# Patient Record
Sex: Female | Born: 1950
Health system: Southern US, Community
[De-identification: ages and names within clinical notes are randomized; demographics above are authoritative.]

## PROBLEM LIST (undated history)

## (undated) DIAGNOSIS — Z9889 Other specified postprocedural states: Secondary | ICD-10-CM

## (undated) DIAGNOSIS — E119 Type 2 diabetes mellitus without complications: Secondary | ICD-10-CM

## (undated) DIAGNOSIS — R748 Abnormal levels of other serum enzymes: Secondary | ICD-10-CM

## (undated) DIAGNOSIS — T8859XA Other complications of anesthesia, initial encounter: Secondary | ICD-10-CM

## (undated) DIAGNOSIS — J45909 Unspecified asthma, uncomplicated: Secondary | ICD-10-CM

## (undated) DIAGNOSIS — H02402 Unspecified ptosis of left eyelid: Secondary | ICD-10-CM

## (undated) DIAGNOSIS — T7840XA Allergy, unspecified, initial encounter: Secondary | ICD-10-CM

## (undated) DIAGNOSIS — I10 Essential (primary) hypertension: Secondary | ICD-10-CM

## (undated) DIAGNOSIS — G510 Bell's palsy: Secondary | ICD-10-CM

## (undated) DIAGNOSIS — E785 Hyperlipidemia, unspecified: Secondary | ICD-10-CM

## (undated) DIAGNOSIS — R112 Nausea with vomiting, unspecified: Secondary | ICD-10-CM

## (undated) DIAGNOSIS — S065XAA Traumatic subdural hemorrhage with loss of consciousness status unknown, initial encounter: Secondary | ICD-10-CM

## (undated) DIAGNOSIS — K760 Fatty (change of) liver, not elsewhere classified: Secondary | ICD-10-CM

## (undated) DIAGNOSIS — K219 Gastro-esophageal reflux disease without esophagitis: Secondary | ICD-10-CM

## (undated) HISTORY — DX: Type 2 diabetes mellitus without complications: E11.9

## (undated) HISTORY — DX: Abnormal levels of other serum enzymes: R74.8

## (undated) HISTORY — DX: Allergy, unspecified, initial encounter: T78.40XA

## (undated) HISTORY — DX: Essential (primary) hypertension: I10

## (undated) HISTORY — PX: BREAST BIOPSY: SHX20

## (undated) HISTORY — PX: CRANIOTOMY: SHX93

## (undated) HISTORY — PX: CRANIECTOMY: SHX331

## (undated) HISTORY — DX: Unspecified asthma, uncomplicated: J45.909

## (undated) HISTORY — DX: Hyperlipidemia, unspecified: E78.5

## (undated) HISTORY — PX: TUBAL LIGATION: SHX77

## (undated) HISTORY — DX: Fatty (change of) liver, not elsewhere classified: K76.0

## (undated) HISTORY — PX: ABDOMINAL HYSTERECTOMY: SHX81

---
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
## 1973-10-08 HISTORY — PX: NASAL FRACTURE SURGERY: SHX718

## 2008-10-08 HISTORY — PX: NASAL SINUS SURGERY: SHX719

## 2019-10-09 HISTORY — PX: CRANIECTOMY: SHX331

## 2019-10-09 HISTORY — PX: CRANIOTOMY: SHX93

## 2020-05-10 DIAGNOSIS — L03213 Periorbital cellulitis: Secondary | ICD-10-CM | POA: Diagnosis not present

## 2020-05-17 DIAGNOSIS — L03213 Periorbital cellulitis: Secondary | ICD-10-CM | POA: Diagnosis not present

## 2020-06-10 ENCOUNTER — Other Ambulatory Visit: Payer: Self-pay

## 2020-06-14 ENCOUNTER — Encounter: Payer: Self-pay | Admitting: Nurse Practitioner

## 2020-06-14 ENCOUNTER — Ambulatory Visit (INDEPENDENT_AMBULATORY_CARE_PROVIDER_SITE_OTHER): Payer: Medicare Other | Admitting: Nurse Practitioner

## 2020-06-14 ENCOUNTER — Other Ambulatory Visit: Payer: Self-pay

## 2020-06-14 VITALS — BP 128/76 | HR 83 | Temp 98.2°F | Ht 64.5 in | Wt 167.0 lb

## 2020-06-14 DIAGNOSIS — I10 Essential (primary) hypertension: Secondary | ICD-10-CM | POA: Diagnosis not present

## 2020-06-14 DIAGNOSIS — K219 Gastro-esophageal reflux disease without esophagitis: Secondary | ICD-10-CM

## 2020-06-14 DIAGNOSIS — K760 Fatty (change of) liver, not elsewhere classified: Secondary | ICD-10-CM | POA: Diagnosis not present

## 2020-06-14 DIAGNOSIS — E785 Hyperlipidemia, unspecified: Secondary | ICD-10-CM

## 2020-06-14 DIAGNOSIS — E119 Type 2 diabetes mellitus without complications: Secondary | ICD-10-CM

## 2020-06-14 DIAGNOSIS — Z1231 Encounter for screening mammogram for malignant neoplasm of breast: Secondary | ICD-10-CM

## 2020-06-14 DIAGNOSIS — Z0000 Encounter for general adult medical examination without abnormal findings: Secondary | ICD-10-CM | POA: Insufficient documentation

## 2020-06-14 DIAGNOSIS — R748 Abnormal levels of other serum enzymes: Secondary | ICD-10-CM

## 2020-06-14 DIAGNOSIS — L989 Disorder of the skin and subcutaneous tissue, unspecified: Secondary | ICD-10-CM

## 2020-06-14 LAB — COMPREHENSIVE METABOLIC PANEL
ALT: 50 U/L — ABNORMAL HIGH (ref 0–35)
AST: 49 U/L — ABNORMAL HIGH (ref 0–37)
Albumin: 4.3 g/dL (ref 3.5–5.2)
Alkaline Phosphatase: 53 U/L (ref 39–117)
BUN: 13 mg/dL (ref 6–23)
CO2: 32 mEq/L (ref 19–32)
Calcium: 9.8 mg/dL (ref 8.4–10.5)
Chloride: 93 mEq/L — ABNORMAL LOW (ref 96–112)
Creatinine, Ser: 0.82 mg/dL (ref 0.40–1.20)
GFR: 69.24 mL/min (ref >60.00)
Glucose, Bld: 240 mg/dL — ABNORMAL HIGH (ref 70–99)
Potassium: 3.9 mEq/L (ref 3.5–5.1)
Sodium: 133 mEq/L — ABNORMAL LOW (ref 135–145)
Total Bilirubin: 0.3 mg/dL (ref 0.2–1.2)
Total Protein: 7.8 g/dL (ref 6.0–8.3)

## 2020-06-14 LAB — LIPID PANEL
Cholesterol: 185 mg/dL (ref 0–200)
HDL: 39.1 mg/dL (ref >39.00)
NonHDL: 145.97
Total CHOL/HDL Ratio: 5
Triglycerides: 286 mg/dL — ABNORMAL HIGH (ref 0.0–149.0)
VLDL: 57.2 mg/dL — ABNORMAL HIGH (ref 0.0–40.0)

## 2020-06-14 LAB — CBC WITH DIFFERENTIAL/PLATELET
Basophils Absolute: 0.1 10*3/uL (ref 0.0–0.1)
Basophils Relative: 0.8 % (ref 0.0–3.0)
Eosinophils Absolute: 0.2 10*3/uL (ref 0.0–0.7)
Eosinophils Relative: 2.9 % (ref 0.0–5.0)
HCT: 37.6 % (ref 36.0–46.0)
Hemoglobin: 12.8 g/dL (ref 12.0–15.0)
Lymphocytes Relative: 24.7 % (ref 12.0–46.0)
Lymphs Abs: 1.7 10*3/uL (ref 0.7–4.0)
MCHC: 33.9 g/dL (ref 30.0–36.0)
MCV: 83 fl (ref 78.0–100.0)
Monocytes Absolute: 0.3 10*3/uL (ref 0.1–1.0)
Monocytes Relative: 4.7 % (ref 3.0–12.0)
Neutro Abs: 4.6 10*3/uL (ref 1.4–7.7)
Neutrophils Relative %: 66.9 % (ref 43.0–77.0)
Platelets: 277 10*3/uL (ref 150.0–400.0)
RBC: 4.53 Mil/uL (ref 3.87–5.11)
RDW: 15.1 % (ref 11.5–15.5)
WBC: 6.8 10*3/uL (ref 4.0–10.5)

## 2020-06-14 LAB — HEMOGLOBIN A1C: Hgb A1c MFr Bld: 8.8 % — ABNORMAL HIGH (ref 4.6–6.5)

## 2020-06-14 LAB — VITAMIN D 25 HYDROXY (VIT D DEFICIENCY, FRACTURES): VITD: 21.95 ng/mL — ABNORMAL LOW (ref 30.00–100.00)

## 2020-06-14 LAB — MICROALBUMIN / CREATININE URINE RATIO
Creatinine,U: 45.8 mg/dL
Microalb Creat Ratio: 1.5 mg/g (ref 0.0–30.0)
Microalb, Ur: <0.7 mg/dL (ref 0.0–1.9)

## 2020-06-14 LAB — LDL CHOLESTEROL, DIRECT: Direct LDL: 109 mg/dL

## 2020-06-14 LAB — B12 AND FOLATE PANEL
Folate: 15.5 ng/mL (ref >5.9)
Vitamin B-12: 260 pg/mL (ref 211–911)

## 2020-06-14 LAB — TSH: TSH: 1.88 u[IU]/mL (ref 0.35–4.50)

## 2020-06-14 NOTE — Progress Notes (Signed)
New Patient Office Visit  Subjective:  Patient ID: Tiffany Gregory, female    DOB: 1951-07-09  Age: 69 y.o. MRN: [id number]  CC:  Chief Complaint  Patient presents with  . New Patient (Initial Visit)    establish care    HPI Tiffany Gregory is a 69 yo who presents to establish care with a [place] provider. She re-located from [place] in June. She fell on ice in January 2021 and struck her head. She  was hospitalized in Jan and had 2 craniotomies for subdural hematomas. She is working and has her own business as Administrator, arts for mental health providers. She occasionally  forgets something, but has recovered from her head injury.   Type 2 DM: Dx DM  in 2009 and on Metformin. On glimepiride and Januvia over the  last year. A1C down to 8 and up to 9.   FBS: 185 today. Not checking regularly. She is taking Meformin 500 mg BID.  Ran out of januvia 2.5 weeks ago- on it for a year- did not help. She was taken off of it from  11/19/19 x 21 days when she was in the hospital for subdural hemaotma.She added Januvia  back in April- May until she ran out of it again 2.5  weeks ago. Not sure she wants to re start it. Vision- saw Ophthalmologist for stye and has  f/up in Nov-needs dilated eye exam. No foot problems.   BMI 28.22.  Not following any diet and has been getting take out food because [place] could not do that in her [place].  Wt up from 150 lbs to  now 167 lbs.  Wt Readings from Last 3 Encounters:  06/14/20 167 lb (75.8 kg)     Patient presents today for complete physical.  Immunizations: flu due, last tetanus- 2017-2021- UTD Diet: poor- eating out too much- poor diet - wants to do better Exercise: walks the dogs Colonoscopy: last 2013 due 2023 Dexa: yes- 2018- reports normal  Pap Smear: last Pap 2018 normal - hysterectomy 1991. No vaginal bleeding Mammogram: Last 2017- reports normal  Dentist: Needs done   Past Medical History:  Diagnosis Date  . Allergy   . Asthma   .  Diabetes mellitus without complication [facility])     Past Surgical History:  Procedure Laterality Date  . ABDOMINAL HYSTERECTOMY    . BREAST BIOPSY    . CRANIECTOMY    . NASAL FRACTURE SURGERY  1975  . NASAL SINUS SURGERY  2010    Family History  Problem Relation Age of Onset  . Diabetes Mother   . Heart attack Mother   . Diabetes Father   . Heart disease Father   . Hypertension Father   . Diabetes Sister   . COPD Sister   . Heart disease Sister   . Diabetes Brother   . Heart attack Maternal Grandmother   . Cancer Maternal Grandfather   . Heart attack Paternal Grandmother   . Cancer Paternal Grandfather   . Heart attack Brother   . Heart disease Brother     Social History   Socioeconomic History  . Marital status: Married    Spouse name: Not on file  . Number of children: Not on file  . Years of education: Not on file  . Highest education level: Bachelor's degree (e.g., BA, AB, BS)  Occupational History  . Not on file  Tobacco Use  . Smoking status: Never Smoker  . Smokeless tobacco: Never Used  Vaping  Use  . Vaping Use: Never used  Substance and Sexual Activity  . Alcohol use: Not Currently  . Drug use: Never  . Sexual activity: Not Currently    Partners: Male  Other Topics Concern  . Not on file  Social History Narrative  . Not on file   Social Determinants of Health   Financial Resource Strain:   . Difficulty of Paying Living Expenses: Not on file  Food Insecurity:   . Worried About Charity fundraiser in the Last Year: Not on file  . Ran Out of Food in the Last Year: Not on file  Transportation Needs:   . Lack of Transportation (Medical): Not on file  . Lack of Transportation (Non-Medical): Not on file  Physical Activity:   . Days of Exercise per Week: Not on file  . Minutes of Exercise per Session: Not on file  Stress:   . Feeling of Stress : Not on file  Social Connections:   . Frequency of Communication with Friends and Family: Not on file    . Frequency of Social Gatherings with Friends and Family: Not on file  . Attends Religious Services: Not on file  . Active Member of Clubs or Organizations: Not on file  . Attends Archivist Meetings: Not on file  . Marital Status: Not on file  Intimate Partner Violence:   . Fear of Current or Ex-Partner: Not on file  . Emotionally Abused: Not on file  . Physically Abused: Not on file  . Sexually Abused: Not on file    Review of Systems  Constitutional: Negative for chills, fatigue and fever.  HENT: Negative.   Eyes: Negative.   Respiratory: Negative.   Cardiovascular: Negative.   Gastrointestinal: Positive for constipation and diarrhea.       IBS- diarrhea- constipation now diarrhea phase 1-2 times per day- not bad. Not tried probiotics or Citrucel.  GERD: curved esoaphuges could not be stretched by EGD. Cannot swallow large pills. No solid or liquid dysphagia.    Endocrine: Negative for cold intolerance, heat intolerance and polyuria.  Genitourinary: Negative.   Musculoskeletal: Negative.   Skin: Negative.        Lesion top of scalp > 1 year. Scalp skin lesion- wants a referral to Derm to get it removed.   Allergic/Immunologic: Positive for environmental allergies.       Hay fever - Zyrtec- as needed in [place]. Pataday helps.   Neurological: Negative for dizziness, seizures, syncope, light-headedness, numbness and headaches.       Little vertigo if turn too quickly at night when she turns over.   Hematological: Negative.   Psychiatric/Behavioral:       Positive anxiety- doing OK. No self harm thoughts.     Objective:   Today's Vitals: BP 128/76 (BP Location: Left Arm, Patient Position: Sitting, Cuff Size: Normal)   Pulse 83   Temp 98.2 F (36.8 C) (Oral)   Ht 5' 4.5" (1.638 m)   Wt 167 lb (75.8 kg)   SpO2 99%   BMI 28.22 kg/m   Physical Exam Vitals reviewed.  Constitutional:      Appearance: Normal appearance.  HENT:     Head: Normocephalic.  Eyes:      Extraocular Movements: Extraocular movements intact.     Conjunctiva/sclera: Conjunctivae normal.     Pupils: Pupils are equal, round, and reactive to light.  Cardiovascular:     Rate and Rhythm: Normal rate and regular rhythm.     Pulses:  Normal pulses.     Heart sounds: Normal heart sounds.  Pulmonary:     Effort: Pulmonary effort is normal.     Breath sounds: Normal breath sounds.  Abdominal:     Palpations: Abdomen is soft.     Tenderness: There is no abdominal tenderness.  Musculoskeletal:        General: Normal range of motion.     Cervical back: Normal range of motion and neck supple.  Skin:    General: Skin is warm and dry.  Neurological:     General: No focal deficit present.     Mental Status: She is alert and oriented to person, place, and time.  Psychiatric:        Mood and Affect: Mood normal.        Behavior: Behavior normal.        Thought Content: Thought content normal.        Judgment: Judgment normal.     Assessment & Plan:   Problem List Items Addressed This Visit      Cardiovascular and Mediastinum   Essential hypertension   Relevant Medications   aspirin EC 81 MG tablet   hydrochlorothiazide (HYDRODIURIL) 25 MG tablet   ramipril (ALTACE) 10 MG capsule   atorvastatin (LIPITOR) 20 MG tablet   Other Relevant Orders   Comprehensive metabolic panel (Completed)     Digestive   Fatty liver   Relevant Orders   US Abdomen Complete     Endocrine   Type 2 diabetes mellitus without complication, without long-term current use of insulin (HCC)   Relevant Medications   aspirin EC 81 MG tablet   metFORMIN (GLUCOPHAGE) 1000 MG tablet   ramipril (ALTACE) 10 MG capsule   atorvastatin (LIPITOR) 20 MG tablet   glimepiride (AMARYL) 2 MG tablet   sitaGLIPtin (JANUVIA) 100 MG tablet   Other Relevant Orders   CBC with Differential/Platelet (Completed)   TSH (Completed)   Hemoglobin A1c (Completed)   Lipid panel (Completed)   VITAMIN D 25 Hydroxy (Vit-D  Deficiency, Fractures) (Completed)   B12 and Folate Panel (Completed)   Microalbumin / creatinine urine ratio (Completed)     Musculoskeletal and Integument   Skin lesion   Relevant Orders   Ambulatory referral to Dermatology     Other   Encounter for medical examination to establish care - Primary    Other Visit Diagnoses    Encounter for screening mammogram for malignant neoplasm of breast       Relevant Orders   MM 3D SCREEN BREAST BILATERAL   Elevated liver enzymes       Relevant Orders   US Abdomen Complete      Outpatient Encounter Medications as of 06/14/2020  Medication Sig  . aspirin EC 81 MG tablet Take 81 mg by mouth daily. Swallow whole.  Marland Kitchen atorvastatin (LIPITOR) 20 MG tablet Take 20 mg by mouth daily.  . Blood Glucose Monitoring Suppl (ONE TOUCH ULTRA 2) w/Device KIT by Does not apply route.  Marland Kitchen glimepiride (AMARYL) 2 MG tablet Take 2 mg by mouth daily with breakfast.  . glucose blood test strip 1 each by Other route as needed for other. Use as instructed. One touch ultra blue in vitro strips  . hydrochlorothiazide (HYDRODIURIL) 25 MG tablet Take 25 mg by mouth daily.  . metFORMIN (GLUCOPHAGE) 1000 MG tablet Take 1,000 mg by mouth 2 (two) times daily with a meal.  . omeprazole (PRILOSEC) 20 MG capsule Take 20 mg by mouth daily.  Marland Kitchen  OneTouch Delica Lancets 82U MISC by Does not apply route.  . ramipril (ALTACE) 10 MG capsule Take 10 mg by mouth 2 (two) times daily.  . sitaGLIPtin (JANUVIA) 100 MG tablet Take 100 mg by mouth daily.   No facility-administered encounter medications on file as of 06/14/2020.   Please check your fasting blood sugar about 4 days out of the week and write this down.  We can review the records. Also check a 2-hour after lunch x3 per week and write it down.   I have placed the order in for screening mammogram. Please call and schedule your 3D mammogram as discussed. [facility]  [address] [place], [place] [phone]  Please keep appointment with Eye doctor as planned in Nov.   Please get routine dental care Dr. Marolyn Haller: [phone].   Office visit follow-up in 2 weeks video visit to go over the labs and make further recommendations regarding diabetes management. Follow-up: Return in about 1 week (around 06/21/2020).   Denice Paradise, NP

## 2020-06-14 NOTE — Patient Instructions (Addendum)
Please go to the lab today for routine laboratory studies.  Please check your fasting blood sugar about 4 days out of the week and write this down.  We can review the records. Also check a 2-hour after lunch x3 per week and write it down.   I have placed the order in for screening mammogram. Please call and schedule your 3D mammogram as discussed. [facility]  [address] [place], [place] [phone]  Please keep appointment with Eye doctor as planned in Nov.   Please get routine dental care Dr. Marolyn Haller: [phone].   Office visit follow-up in 2 weeks video visit to go over the labs and make further recommendations regarding diabetes management.   Diabetes Mellitus and Standards of Medical Care Managing diabetes (diabetes mellitus) can be complicated. Your diabetes treatment may be managed by a team of health care providers, including:  A physician who specializes in diabetes (endocrinologist).  A nurse practitioner or physician assistant.  Nurses.  A diet and nutrition specialist (registered dietitian).  A certified diabetes educator (CDE).  An exercise specialist.  A pharmacist.  An eye doctor.  A foot specialist (podiatrist).  A dentist.  A primary care provider.  A mental health provider. Your health care providers follow guidelines to help you get the best quality of care. The following schedule is a general guideline for your diabetes management plan. Your health care providers may give you more specific instructions. Physical exams Upon being diagnosed with diabetes mellitus, and each year after that, your health care provider will ask about your medical and family history. He or she will also do a physical exam. Your exam may include:  Measuring your height, weight, and body mass index (BMI).  Checking your blood pressure. This will be done at every routine medical visit. Your target blood pressure may vary depending  on your medical conditions, your age, and other factors.  Thyroid gland exam.  Skin exam.  Screening for damage to your nerves (peripheral neuropathy). This may include checking the pulse in your legs and feet and checking the level of sensation in your hands and feet.  A complete foot exam to inspect the structure and skin of your feet, including checking for cuts, bruises, redness, blisters, sores, or other problems.  Screening for blood vessel (vascular) problems, which may include checking the pulse in your legs and feet and checking your temperature. Blood tests Depending on your treatment plan and your personal needs, you may have the following tests done:  HbA1c (hemoglobin A1c). This test provides information about blood sugar (glucose) control over the previous 2-3 months. It is used to adjust your treatment plan, if needed. This test will be done: ? At least 2 times a year, if you are meeting your treatment goals. ? 4 times a year, if you are not meeting your treatment goals or if treatment goals have changed.  Lipid testing, including total, LDL, and HDL cholesterol and triglyceride levels. ? The goal for LDL is less than 100 mg/dL (5.5 mmol/L). If you are at high risk for complications, the goal is less than 70 mg/dL (3.9 mmol/L). ? The goal for HDL is 40 mg/dL (2.2 mmol/L) or higher for men and 50 mg/dL (2.8 mmol/L) or higher for women. An HDL cholesterol of 60 mg/dL (3.3 mmol/L) or higher gives some protection against heart disease. ? The goal for triglycerides is less than 150 mg/dL (8.3 mmol/L).  Liver function tests.  Kidney function tests.  Thyroid function  tests. Dental and eye exams  Visit your dentist two times a year.  If you have type 1 diabetes, your health care provider may recommend an eye exam 3-5 years after you are diagnosed, and then once a year after your first exam. ? For children with type 1 diabetes, a health care provider may recommend an eye exam  when your child is age 37 or older and has had diabetes for 3-5 years. After the first exam, your child should get an eye exam once a year.  If you have type 2 diabetes, your health care provider may recommend an eye exam as soon as you are diagnosed, and then once a year after your first exam. Immunizations   The yearly flu (influenza) vaccine is recommended for everyone 6 months or older who has diabetes.  The pneumonia (pneumococcal) vaccine is recommended for everyone 2 years or older who has diabetes. If you are 88 or older, you may get the pneumonia vaccine as a series of two separate shots.  The hepatitis B vaccine is recommended for adults shortly after being diagnosed with diabetes.  Adults and children with diabetes should receive all other vaccines according to age-specific recommendations from the Centers for Disease Control and Prevention (CDC). Mental and emotional health Screening for symptoms of eating disorders, anxiety, and depression is recommended at the time of diagnosis and afterward as needed. If your screening shows that you have symptoms (positive screening result), you may need more evaluation and you may work with a mental health care provider. Treatment plan Your treatment plan will be reviewed at every medical visit. You and your health care provider will discuss:  How you are taking your medicines, including insulin.  Any side effects you are experiencing.  Your blood glucose target goals.  The frequency of your blood glucose monitoring.  Lifestyle habits, such as activity level as well as tobacco, alcohol, and substance use. Diabetes self-management education Your health care provider will assess how well you are monitoring your blood glucose levels and whether you are taking your insulin correctly. He or she may refer you to:  A certified diabetes educator to manage your diabetes throughout your life, starting at diagnosis.  A registered dietitian who  can create or review your personal nutrition plan.  An exercise specialist who can discuss your activity level and exercise plan. Summary  Managing diabetes (diabetes mellitus) can be complicated. Your diabetes treatment may be managed by a team of health care providers.  Your health care providers follow guidelines in order to help you get the best quality of care.  Standards of care including having regular physical exams, blood tests, blood pressure monitoring, immunizations, screening tests, and education about how to manage your diabetes.  Your health care providers may also give you more specific instructions based on your individual health. This information is not intended to replace advice given to you by your health care provider. Make sure you discuss any questions you have with your health care provider. Document Revised: 06/13/2018 Document Reviewed: 06/22/2016 Elsevier Patient Education  [place].  Hypertension, Adult High blood pressure (hypertension) is when the force of blood pumping through the arteries is too strong. The arteries are the blood vessels that carry blood from the heart throughout the body. Hypertension forces the heart to work harder to pump blood and may cause arteries to become narrow or stiff. Untreated or uncontrolled hypertension can cause a heart attack, heart failure, a stroke, kidney disease, and other problems.  A blood pressure reading consists of a higher number over a lower number. Ideally, your blood pressure should be below 120/80. The first ("top") number is called the systolic pressure. It is a measure of the pressure in your arteries as your heart beats. The second ("bottom") number is called the diastolic pressure. It is a measure of the pressure in your arteries as the heart relaxes. What are the causes? The exact cause of this condition is not known. There are some conditions that result in or are related to high blood pressure. What  increases the risk? Some risk factors for high blood pressure are under your control. The following factors may make you more likely to develop this condition:  Smoking.  Having type 2 diabetes mellitus, high cholesterol, or both.  Not getting enough exercise or physical activity.  Being overweight.  Having too much fat, sugar, calories, or salt (sodium) in your diet.  Drinking too much alcohol. Some risk factors for high blood pressure may be difficult or impossible to change. Some of these factors include:  Having chronic kidney disease.  Having a family history of high blood pressure.  Age. Risk increases with age.  Race. You may be at higher risk if you are African American.  Gender. Men are at higher risk than women before age 60. After age 75, women are at higher risk than men.  Having obstructive sleep apnea.  Stress. What are the signs or symptoms? High blood pressure may not cause symptoms. Very high blood pressure (hypertensive crisis) may cause:  Headache.  Anxiety.  Shortness of breath.  Nosebleed.  Nausea and vomiting.  Vision changes.  Severe chest pain.  Seizures. How is this diagnosed? This condition is diagnosed by measuring your blood pressure while you are seated, with your arm resting on a flat surface, your legs uncrossed, and your feet flat on the floor. The cuff of the blood pressure monitor will be placed directly against the skin of your upper arm at the level of your heart. It should be measured at least twice using the same arm. Certain conditions can cause a difference in blood pressure between your right and left arms. Certain factors can cause blood pressure readings to be lower or higher than normal for a short period of time:  When your blood pressure is higher when you are in a health care provider's office than when you are at home, this is called white coat hypertension. Most people with this condition do not need  medicines.  When your blood pressure is higher at home than when you are in a health care provider's office, this is called masked hypertension. Most people with this condition may need medicines to control blood pressure. If you have a high blood pressure reading during one visit or you have normal blood pressure with other risk factors, you may be asked to:  Return on a different day to have your blood pressure checked again.  Monitor your blood pressure at home for 1 week or longer. If you are diagnosed with hypertension, you may have other blood or imaging tests to help your health care provider understand your overall risk for other conditions. How is this treated? This condition is treated by making healthy lifestyle changes, such as eating healthy foods, exercising more, and reducing your alcohol intake. Your health care provider may prescribe medicine if lifestyle changes are not enough to get your blood pressure under control, and if:  Your systolic blood pressure is above 130.  Your diastolic blood pressure is above 80. Your personal target blood pressure may vary depending on your medical conditions, your age, and other factors. Follow these instructions at home: Eating and drinking   Eat a diet that is high in fiber and potassium, and low in sodium, added sugar, and fat. An example eating plan is called the DASH (Dietary Approaches to Stop Hypertension) diet. To eat this way: ? Eat plenty of fresh fruits and vegetables. Try to fill one half of your plate at each meal with fruits and vegetables. ? Eat whole grains, such as whole-wheat pasta, brown rice, or whole-grain bread. Fill about one fourth of your plate with whole grains. ? Eat or drink low-fat dairy products, such as skim milk or low-fat yogurt. ? Avoid fatty cuts of meat, processed or cured meats, and poultry with skin. Fill about one fourth of your plate with lean proteins, such as fish, chicken without skin, beans, eggs,  or tofu. ? Avoid pre-made and processed foods. These tend to be higher in sodium, added sugar, and fat.  Reduce your daily sodium intake. Most people with hypertension should eat less than 1,500 mg of sodium a day.  Do not drink alcohol if: ? Your health care provider tells you not to drink. ? You are pregnant, may be pregnant, or are planning to become pregnant.  If you drink alcohol: ? Limit how much you use to:  0-1 drink a day for women.  0-2 drinks a day for men. ? Be aware of how much alcohol is in your drink. In the [place] one drink equals one 12 oz bottle of beer (355 mL), one 5 oz glass of wine (148 mL), or one 1 oz glass of hard liquor (44 mL). Lifestyle   Work with your health care provider to maintain a healthy body weight or to lose weight. Ask what an ideal weight is for you.  Get at least 30 minutes of exercise most days of the week. Activities may include walking, swimming, or biking.  Include exercise to strengthen your muscles (resistance exercise), such as Pilates or lifting weights, as part of your weekly exercise routine. Try to do these types of exercises for 30 minutes at least 3 days a week.  Do not use any products that contain nicotine or tobacco, such as cigarettes, e-cigarettes, and chewing tobacco. If you need help quitting, ask your health care provider.  Monitor your blood pressure at home as told by your health care provider.  Keep all follow-up visits as told by your health care provider. This is important. Medicines  Take over-the-counter and prescription medicines only as told by your health care provider. Follow directions carefully. Blood pressure medicines must be taken as prescribed.  Do not skip doses of blood pressure medicine. Doing this puts you at risk for problems and can make the medicine less effective.  Ask your health care provider about side effects or reactions to medicines that you should watch for. Contact a health care  provider if you:  Think you are having a reaction to a medicine you are taking.  Have headaches that keep coming back (recurring).  Feel dizzy.  Have swelling in your ankles.  Have trouble with your vision. Get help right away if you:  Develop a severe headache or confusion.  Have unusual weakness or numbness.  Feel faint.  Have severe pain in your chest or abdomen.  Vomit repeatedly.  Have trouble breathing. Summary  Hypertension is when the force of  blood pumping through your arteries is too strong. If this condition is not controlled, it may put you at risk for serious complications.  Your personal target blood pressure may vary depending on your medical conditions, your age, and other factors. For most people, a normal blood pressure is less than 120/80.  Hypertension is treated with lifestyle changes, medicines, or a combination of both. Lifestyle changes include losing weight, eating a healthy, low-sodium diet, exercising more, and limiting alcohol. This information is not intended to replace advice given to you by your health care provider. Make sure you discuss any questions you have with your health care provider. Document Revised: 06/04/2018 Document Reviewed: 06/04/2018 Elsevier Patient Education  2020 [employer].

## 2020-06-15 ENCOUNTER — Encounter: Payer: Self-pay | Admitting: Nurse Practitioner

## 2020-06-15 DIAGNOSIS — K760 Fatty (change of) liver, not elsewhere classified: Secondary | ICD-10-CM | POA: Insufficient documentation

## 2020-06-16 ENCOUNTER — Telehealth: Payer: Self-pay | Admitting: Nurse Practitioner

## 2020-06-16 DIAGNOSIS — E871 Hypo-osmolality and hyponatremia: Secondary | ICD-10-CM

## 2020-06-16 DIAGNOSIS — R7989 Other specified abnormal findings of blood chemistry: Secondary | ICD-10-CM

## 2020-06-16 DIAGNOSIS — I10 Essential (primary) hypertension: Secondary | ICD-10-CM

## 2020-06-16 DIAGNOSIS — K219 Gastro-esophageal reflux disease without esophagitis: Secondary | ICD-10-CM

## 2020-06-16 DIAGNOSIS — E785 Hyperlipidemia, unspecified: Secondary | ICD-10-CM

## 2020-06-16 MED ORDER — OMEPRAZOLE 20 MG PO CPDR: 20.0000 mg | DELAYED_RELEASE_CAPSULE | Freq: Every day | ORAL | 1 refills | Status: DC

## 2020-06-16 MED ORDER — METFORMIN HCL ER 500 MG PO TB24: ORAL_TABLET | ORAL | 1 refills | Status: DC

## 2020-06-16 MED ORDER — RAMIPRIL 10 MG PO CAPS: 10.0000 mg | ORAL_CAPSULE | Freq: Two times a day (BID) | ORAL | 1 refills | Status: DC

## 2020-06-16 MED ORDER — ATORVASTATIN CALCIUM 20 MG PO TABS: 20.0000 mg | ORAL_TABLET | Freq: Every day | ORAL | 1 refills | Status: DC

## 2020-06-16 NOTE — Telephone Encounter (Signed)
Pt called back  Please call [phone]

## 2020-06-16 NOTE — Telephone Encounter (Signed)
I spoke to Tiffany Gregory about her NP note.  She has a low sodium and chloride.  I will hold her HCTZ over the weekend.  Repeat be met on Monday at [facility] lab when she is getting her ultrasound.  She has a video visit with me on Tuesday.  Restarted her Metformin but in the XR form.  We will try to go up to 1000 mg twice daily and she will titrate as she is able with the outside GI disturbance.  She is on glimepiride 2 mg daily.  I did not refill this.  Our plan is to wean this off.  She is going to monitor her blood sugars to make sure she does not have low blood sugars and if she does she will cut this in half or discontinue.  We can discuss next week.  She does have a history of mildly elevated liver labs, current values are stable, with history of fatty liver.  We will continue her Lipitor at 20 mg.  Her lipid panel is not well controlled but will need to decide on increasing Lipitor based on her liver work-up.  She does have history of elevated LFTs, and specialty liver labs from 2020 have shown no autoimmune, ceruloplasmin, hepatitis CRP, celiac, or elevated ferritin findings.

## 2020-06-16 NOTE — Telephone Encounter (Signed)
Left message on machine for Tiffany Gregory to call me back to discuss her hyponatremia, elevated liver enzymes, and diabetes medication, and her statin.  Received records from her previous provider in [place]  06/2019.

## 2020-06-17 NOTE — Telephone Encounter (Signed)
Tiffany Gregory Has documented on the patients labs on 06/15/20 that she has called and discussed plan of care.

## 2020-06-20 ENCOUNTER — Other Ambulatory Visit: Payer: Self-pay

## 2020-06-20 ENCOUNTER — Other Ambulatory Visit
Admission: RE | Admit: 2020-06-20 | Discharge: 2020-06-20 | Disposition: A | Discharge status: Home / Self Care | Payer: Medicare Other | Source: Home / Self Care | Attending: Nurse Practitioner | Admitting: Nurse Practitioner

## 2020-06-20 ENCOUNTER — Ambulatory Visit
Admission: RE | Admit: 2020-06-20 | Discharge: 2020-06-20 | Disposition: A | Discharge status: Home / Self Care | Payer: Medicare Other | Source: Ambulatory Visit | Attending: Nurse Practitioner | Admitting: Nurse Practitioner

## 2020-06-20 DIAGNOSIS — E871 Hypo-osmolality and hyponatremia: Secondary | ICD-10-CM | POA: Diagnosis not present

## 2020-06-20 DIAGNOSIS — R7989 Other specified abnormal findings of blood chemistry: Secondary | ICD-10-CM | POA: Insufficient documentation

## 2020-06-20 DIAGNOSIS — K7689 Other specified diseases of liver: Secondary | ICD-10-CM | POA: Diagnosis not present

## 2020-06-20 DIAGNOSIS — K760 Fatty (change of) liver, not elsewhere classified: Secondary | ICD-10-CM | POA: Diagnosis not present

## 2020-06-20 DIAGNOSIS — R748 Abnormal levels of other serum enzymes: Secondary | ICD-10-CM

## 2020-06-20 LAB — BASIC METABOLIC PANEL
Anion gap: 9 (ref 5–15)
BUN: 14 mg/dL (ref 8–23)
CO2: 29 mmol/L (ref 22–32)
Calcium: 9 mg/dL (ref 8.9–10.3)
Chloride: 100 mmol/L (ref 98–111)
Creatinine, Ser: 0.76 mg/dL (ref 0.44–1.00)
GFR calc Af Amer: >60 mL/min (ref >60)
GFR calc non Af Amer: >60 mL/min (ref >60)
Glucose, Bld: 155 mg/dL — ABNORMAL HIGH (ref 70–99)
Potassium: 3.8 mmol/L (ref 3.5–5.1)
Sodium: 138 mmol/L (ref 135–145)

## 2020-06-20 LAB — PROTIME-INR
INR: 1 (ref 0.8–1.2)
Prothrombin Time: 12.7 seconds (ref 11.4–15.2)

## 2020-06-20 LAB — FERRITIN: Ferritin: 16 ng/mL (ref 11–307)

## 2020-06-20 LAB — HEPATITIS B CORE ANTIBODY, TOTAL: Hep B Core Total Ab: REACTIVE — AB

## 2020-06-20 LAB — HEPATITIS A ANTIBODY, TOTAL: hep A Total Ab: NONREACTIVE

## 2020-06-21 ENCOUNTER — Encounter: Payer: Self-pay | Admitting: Nurse Practitioner

## 2020-06-21 ENCOUNTER — Telehealth (INDEPENDENT_AMBULATORY_CARE_PROVIDER_SITE_OTHER): Payer: Medicare Other | Admitting: Nurse Practitioner

## 2020-06-21 VITALS — BP 137/82 | HR 80 | Ht 65.0 in | Wt 167.0 lb

## 2020-06-21 DIAGNOSIS — E663 Overweight: Secondary | ICD-10-CM

## 2020-06-21 DIAGNOSIS — E1165 Type 2 diabetes mellitus with hyperglycemia: Secondary | ICD-10-CM

## 2020-06-21 DIAGNOSIS — Z8619 Personal history of other infectious and parasitic diseases: Secondary | ICD-10-CM

## 2020-06-21 DIAGNOSIS — E785 Hyperlipidemia, unspecified: Secondary | ICD-10-CM | POA: Diagnosis not present

## 2020-06-21 DIAGNOSIS — R748 Abnormal levels of other serum enzymes: Secondary | ICD-10-CM

## 2020-06-21 DIAGNOSIS — K760 Fatty (change of) liver, not elsewhere classified: Secondary | ICD-10-CM | POA: Diagnosis not present

## 2020-06-21 DIAGNOSIS — I10 Essential (primary) hypertension: Secondary | ICD-10-CM

## 2020-06-21 DIAGNOSIS — Z6827 Body mass index (BMI) 27.0-27.9, adult: Secondary | ICD-10-CM

## 2020-06-21 LAB — HEPATITIS B SURFACE ANTIBODY, QUANTITATIVE: Hep B S AB Quant (Post): <3.1 m[IU]/mL — ABNORMAL LOW (ref >9.9)

## 2020-06-21 LAB — ALPHA-1 ANTITRYPSIN PHENOTYPE: A-1 Antitrypsin, Ser: 119 mg/dL (ref 101–187)

## 2020-06-21 NOTE — Addendum Note (Signed)
Addended by: Denice Paradise A on: 06/21/2020 05:06 AM   Modules accepted: Orders

## 2020-06-21 NOTE — Progress Notes (Signed)
Noted  

## 2020-06-21 NOTE — Progress Notes (Addendum)
Virtual Visit via Video Note  This visit type was conducted due to national recommendations for restrictions regarding the COVID-19 pandemic (e.g. social distancing).  This format is felt to be most appropriate for this patient at this time.  All issues noted in this document were discussed and addressed.  No physical exam was performed (except for noted visual exam findings with Video Visits).   I connected with@ on 06/22/20 at  4:30 PM EDT by a video enabled telemedicine application or telephone and verified that I am speaking with the correct person using two identifiers. Location patient: home Location provider: work or home office Persons participating in the virtual visit: patient, provider  I discussed the limitations, risks, security and privacy concerns of performing an evaluation and management service by telephone and the availability of in person appointments. I also discussed with the patient that there may be a patient responsible charge related to this service. The patient expressed understanding and agreed to proceed.   Reason for visit: Follow-up on hyponatremia, diabetes mellitus, elevated liver enzymes, hepatic steatosis, and hyperlipidemia.    HPI: Established care last week from [place] and routine laboratory studies were obtained.  Received records from her previous provider in [place] and reviewed.    T2DM: A1c 8.8. FBS 185 . Not at goal of A1c<7. She presented on Metformin 500 mg twice daily, glimepiride 2 mg daily and ran out of Januvia about 2-1/2 weeks prior.  She reports Januvia never helped her blood sugar.  Her A1c's have been running 7.1 in 2020.    Last week, we increased her Metformin to Metformin XR 1000 mg twice daily. She is tolerating this poorly.  After she increased to 1000 mg in the morning she started to have burping and belching and bad heartburn. She feels that the increased dose is giving her heartburn.  She presents today on  Metformin XR 500  in the morning and 1000 mg in the evening with burping, belching, and uneasy feeling in her stomach despite taking omeprazole every day and Tums as needed. No N/V or diarrhea. BM normal.  FBS: [phone] this am. On 9/10 her 3 hr pp 326.  Following lab work yesterday, our plan is to move her into [place] or [place].  However, she reports that she has thought it over and does not want to use subcutaneous medication.   Hyponatremia: Sodium returned 133.  Her record review shows she has not had problems with hyponatremia and has been on HCTZ for many years.  She could not explain the hyponatremia with change in diet, or excessive water consumption. She held  the HCTZ over the weekend and her Bmet returned normal yesterday.  Essential HTN: Maintained on her ramipril 10 mg twice daily, holding the HCTZ 25 mg over the weekend.  BP have ranged 118/91-127/86.  She is developing slight tight feeling in her ankles, nonpitting edema.  She believes HCTZ was controlling that edema.  BP Readings from Last 3 Encounters:  06/21/20 137/82  06/14/20 128/76    Elevated LFT/Fatty liver:  She does have history of elevated LFTs, and specialty liver labs from 2020 have shown no autoimmune, ceruloplasmin, hepatitis B and C,  CRP, celiac, or elevated ferritin findings. She reports having Hepatitis B when she was 69 yo likely from IV drug use experimentation.  She reports she was hospitalized and treated with unknown medications.  She has never seen a liver specialist.  No current alcohol or substance use.  Recent liver labs show  no immunity to hepatitis A or B. Normal synthetic liver function with platelet count 277, albumin 4.3, and chronic mild elevation AST, ALT appear stable.  06/20/2020: Hepatitis A total antibody: Nonreactive  Hepatitis B core antibody: positive Hepatitis B surface antibody quantitative : less than 3.1 no immunity Ferritin: 16 PT/INR: 12.7 and 1.0 Immunoglobulins: Pending Alpha-1 antitrypsin:  119 and MM  06/20/2020: ABD [place] : Negative gallstones, mild increased echogenicity of the liver may represent mild fatty infiltration.  No biliary dilatation.    Chemistry      Component Value Date/Time   NA 138 06/20/2020 1054   K 3.8 06/20/2020 1054   CL 100 06/20/2020 1054   CO2 29 06/20/2020 1054   BUN 14 06/20/2020 1054   CREATININE 0.76 06/20/2020 1054      Component Value Date/Time   CALCIUM 9.0 06/20/2020 1054   ALKPHOS 53 06/14/2020 1117   AST 49 (H) 06/14/2020 1117   ALT 50 (H) 06/14/2020 1117   BILITOT 0.3 06/14/2020 1117      HLD/: BMI 27.79/overweight: LDL is 109. Triglycerides elevated. Not at goal of <70 with DM. Maintained on Lipitor at 20 mg.  She reports recently a terrible diet, eating out and not exercising. Lab Results  Component Value Date   CHOL 185 06/14/2020   HDL 39.10 06/14/2020   LDLDIRECT 109.0 06/14/2020   TRIG 286.0 (H) 06/14/2020   CHOLHDL 5 06/14/2020    Wt Readings from Last 3 Encounters:  06/21/20 167 lb (75.8 kg)  06/14/20 167 lb (75.8 kg)    ROS: See pertinent positives and negatives per HPI.  Past Medical History:  Diagnosis Date  . Allergy   . Asthma   . Diabetes mellitus without complication ([place])   . Elevated liver enzymes   . Hepatic steatosis   . History of hepatitis B 1971   She had IV drug use experimentation and was hosptialized and reports treated.   . Hyperlipidemia   . Hypertension     Past Surgical History:  Procedure Laterality Date  . ABDOMINAL HYSTERECTOMY    . BREAST BIOPSY    . CRANIECTOMY    . NASAL FRACTURE SURGERY  1975  . NASAL SINUS SURGERY  2010    Family History  Problem Relation Age of Onset  . Diabetes Mother   . Heart attack Mother   . Diabetes Father   . Heart disease Father   . Hypertension Father   . Diabetes Sister   . COPD Sister   . Heart disease Sister   . Diabetes Brother   . Heart attack Maternal Grandmother   . Cancer Maternal Grandfather   . Heart attack Paternal  Grandmother   . Cancer Paternal Grandfather   . Heart attack Brother   . Heart disease Brother     SOCIAL HX: Never smoked   Current Outpatient Medications:  .  aspirin EC 81 MG tablet, Take 81 mg by mouth daily. Swallow whole., Disp: , Rfl:  .  atorvastatin (LIPITOR) 20 MG tablet, Take 1 tablet (20 mg total) by mouth daily., Disp: 90 tablet, Rfl: 1 .  Blood Glucose Monitoring Suppl (ONE TOUCH ULTRA 2) w/Device KIT, by Does not apply route., Disp: , Rfl:  .  glucose blood test strip, 1 each by Other route as needed for other. Use as instructed. One touch ultra blue in vitro strips, Disp: , Rfl:  .  metFORMIN (GLUCOPHAGE XR) 500 MG 24 hr tablet, Take 1000 mg twice daily with meals., Disp: 180  tablet, Rfl: 1 .  omeprazole (PRILOSEC) 20 MG capsule, Take 1 capsule (20 mg total) by mouth daily., Disp: 90 capsule, Rfl: 1 .  OneTouch Delica Lancets 16S MISC, by Does not apply route., Disp: , Rfl:  .  ramipril (ALTACE) 10 MG capsule, Take 1 capsule (10 mg total) by mouth 2 (two) times daily., Disp: 90 capsule, Rfl: 1 .  glimepiride (AMARYL) 2 MG tablet, Take 2 mg by mouth daily with breakfast. (Patient not taking: Reported on 06/21/2020), Disp: , Rfl:  .  hydrochlorothiazide (HYDRODIURIL) 25 MG tablet, Take 25 mg by mouth daily. (Patient not taking: Reported on 06/21/2020), Disp: , Rfl:   EXAM:  VITALS per patient if applicable:  GENERAL: alert, oriented, appears well and in no acute distress  HEENT: atraumatic, conjunctiva clear, no obvious abnormalities on inspection of external nose and ears  NECK: normal movements of the head and neck  LUNGS: on inspection no signs of respiratory distress, breathing rate appears normal, no obvious gross SOB, gasping or wheezing  CV: no obvious cyanosis  MS: moves all visible extremities without noticeable abnormality  PSYCH/NEURO: pleasant and cooperative, no obvious depression or anxiety, speech and thought processing grossly intact  ASSESSMENT AND  PLAN:  Discussed the following assessment and plan:  Type 2 diabetes mellitus with hyperglycemia, without long-term current use of insulin ([place]) - Plan: Referral to Chronic Care Management Services  Essential hypertension  Hyperlipidemia, unspecified hyperlipidemia type - Plan: Referral to Chronic Care Management Services  Elevated liver enzymes - Plan: Referral to Chronic Care Management Services  Hepatic steatosis - Plan: Referral to Chronic Care Management Services  History of hepatitis B virus infection  Overweight with body mass index (BMI) of 27 to 27.9 in adult - Plan: Referral to Chronic Care Management Services  No problem-specific Assessment & Plan notes found for this encounter. You appear to not to be tolerating the Metformin XR 1000 mg twice daily.  Try reducing to 500 mg twice daily and see if the heartburn and belching and uneasy stomach improves.  Restart your glimepiride 2 mg daily.  I considered adding Jardiance 10 mg daily but it may increase LDL.  I would prefer to use a GLP-1 agonist as that may lower the A1C better.  We will place a referral into chronic care management for our clinical pharmacist oversight.   You have experienced weight gain, a more unhealthy diet since your move to [place].  Continue to work on healthy diet and begin a regular exercise walking program- 10 min a day and increase as you are able.   I discussed the assessment and treatment plan with the patient. The patient was provided an opportunity to ask questions and all were answered. The patient agreed with the plan and demonstrated an understanding of the instructions.   The patient was advised to call back or seek an in-person evaluation if the symptoms worsen or if the condition fails to improve as anticipated. Addendum: 06/28/2020: Records received from [facility] with laboratory studies dated 06/18/2019: Glucose 166, BUN 13, creatinine 0.86, hemoglobin A1c 7.1, vitamin D 35, AST 55, ALT  41, albumin 4.4, TSH 2.04, free T4 1.24, WBC 7.5 hemoglobin 14.1 MCV 86, platelets 341.  Denice Paradise, NP Adult Nurse Practitioner [place] [phone]

## 2020-06-21 NOTE — Patient Instructions (Addendum)
You appear to not to be tolerating the Metformin XR 1000 mg twice daily.  Try reducing to 500 mg twice daily and see if the heartburn and belching and uneasy stomach improves.  Restart your glimepiride 2 mg daily.  I considered adding Jardiance 10 mg daily but it may increase LDL.  We will place a referral into chronic care management for our clinical pharmacist oversight.   You have experienced weight gain, a more unhealthy diet since your move to [place].  Continue to work on healthy diet and begin a regular exercise walking program- 10 min a day and increase as you are able.

## 2020-06-22 ENCOUNTER — Telehealth: Payer: Self-pay | Admitting: Nurse Practitioner

## 2020-06-22 ENCOUNTER — Encounter: Payer: Self-pay | Admitting: Nurse Practitioner

## 2020-06-22 ENCOUNTER — Telehealth: Payer: Self-pay

## 2020-06-22 DIAGNOSIS — R748 Abnormal levels of other serum enzymes: Secondary | ICD-10-CM | POA: Insufficient documentation

## 2020-06-22 DIAGNOSIS — Z8619 Personal history of other infectious and parasitic diseases: Secondary | ICD-10-CM

## 2020-06-22 DIAGNOSIS — E785 Hyperlipidemia, unspecified: Secondary | ICD-10-CM | POA: Insufficient documentation

## 2020-06-22 DIAGNOSIS — K760 Fatty (change of) liver, not elsewhere classified: Secondary | ICD-10-CM

## 2020-06-22 DIAGNOSIS — E663 Overweight: Secondary | ICD-10-CM | POA: Insufficient documentation

## 2020-06-22 DIAGNOSIS — E1165 Type 2 diabetes mellitus with hyperglycemia: Secondary | ICD-10-CM

## 2020-06-22 DIAGNOSIS — Z6827 Body mass index (BMI) 27.0-27.9, adult: Secondary | ICD-10-CM | POA: Insufficient documentation

## 2020-06-22 MED ORDER — PEN NEEDLES 31G X 6 MM MISC: 0 refills | Status: DC

## 2020-06-22 MED ORDER — OZEMPIC (0.25 OR 0.5 MG/DOSE) 2 MG/1.5ML ~~LOC~~ SOPN: PEN_INJECTOR | SUBCUTANEOUS | 0 refills | Status: DC

## 2020-06-22 NOTE — Chronic Care Management (AMB) (Signed)
  Chronic Care Management   Note  06/22/2020 Name: Tiffany Gregory MRN: [id number] DOB: 27-Jan-1951  Tiffany Gregory is a 69 y.o. year old female who is a primary care patient of Marval Regal, NP. I reached out to [name] by phone today in response to a referral sent by Tiffany Gregory PCP, Denice Paradise, NP      Tiffany Gregory was given information about Chronic Care Management services today including:  1. CCM service includes personalized support from designated clinical staff supervised by her physician, including individualized plan of care and coordination with other care providers 2. 24/7 contact phone numbers for assistance for urgent and routine care needs. 3. Service will only be billed when office clinical staff spend 20 minutes or more in a month to coordinate care. 4. Only one practitioner may furnish and bill the service in a calendar month. 5. The patient may stop CCM services at any time (effective at the end of the month) by phone call to the office staff. 6. The patient will be responsible for cost sharing (co-pay) of up to 20% of the service fee (after annual deductible is met).  Patient agreed to services and verbal consent obtained.   Follow up plan: Telephone appointment with care management team member scheduled for:07/13/2020  Tiffany Gregory, [place], [place], [place] [address] Direct Dial: [phone] [email or web address] Website: [email or web address]

## 2020-06-22 NOTE — Telephone Encounter (Signed)
I called her and advised:  1. Please stop the glipizide .   2. Stay on Metformin XR 500 mg twice day.   3. Begin new SQ injection Ozempic Please arrange a nurse visit. Please ask her to bring the Ozempic in to the visit to learn how to give the injections.   4. Go back on HCTZ 1 pill daily. Needs lab in 2 weeks to recheck Bmet for sodium.  Adding  more Hepatitis labs. Needs lab appt.   5. Monitor FBS x 4 per week and spot check 2 hours after supper x2 per week. Record and bring in to review.   6. Needs OV with me in 1 month.   Information about your medication: GLP-1 receptor agonist  Generic Name (Brand): liraglutide (Victoza), dulaglutide (Trulicity), semaglutide (Ozempic)  GLP-1 receptor agonists bind with the GLP-1 receptor in the pancreas, which will stimulate insulin release. The GLP-1 agonists also delay stomach emptying.   Common SIDE EFFECTS you should be aware of include: nausea, vomiting, diarrhea, or abdominal pain. For most people, these side effects will go away after the first 3 injections. Many people also lose weight.   Inject into abdomen, thighs, or upper arms, once weekly. Rotate injection sites from week to week.   You and your health care provider may need to check your blood sugars, A1c, and kidney function regularly.

## 2020-06-23 LAB — IMMUNOGLOBULINS A/E/G/M, SERUM
IgA: 378 mg/dL — ABNORMAL HIGH (ref 87–352)
IgE (Immunoglobulin E), Serum: 33 IU/mL (ref 6–495)
IgG (Immunoglobin G), Serum: 1074 mg/dL (ref 586–1602)
IgM (Immunoglobulin M), Srm: 70 mg/dL (ref 26–217)

## 2020-06-23 NOTE — Addendum Note (Signed)
Addended by: Denice Paradise A on: 06/23/2020 06:48 AM   Modules accepted: Orders

## 2020-06-23 NOTE — Telephone Encounter (Signed)
Patient is scheduled for lab and nurse visit on 07/06/20 at 9:15 am and 9:45 am. Patient is also scheduled for 1 month f/u with Maudie Mercury on 07/22/20 at 10:30 am.

## 2020-06-24 ENCOUNTER — Telehealth: Payer: Self-pay | Admitting: Nurse Practitioner

## 2020-06-24 NOTE — Telephone Encounter (Signed)
I LMOM on her cell phone with patient voice and name identifier: Advised  to increase the Metformin dose if possible to 500 mg  in the morning and 1000 mg in the evening and if it upsets his stomach to continue with 500 twice a day.  Begin Ozempic and have the pharmacist show her how to dose that once a week.  Her nurse visit is not set up for 2 weeks and she may want to get a start on the [place] before then.  She has given insulin to her mother before and stated she is comfortable giving herself injections.  Continue to monitor blood sugar and really watch the diet, cutting out snack foods, sweets, and following a diabetic diet may help.    For her leg edema, may purchase compression knee highs 20-30 MmHg, elevate the legs when sitting down.  Stay on the HCTZ as directed.  Low-salt diet may be helpful.  We can discuss this further on Monday.  PLAN:  Please call her on Mon for symptom update.

## 2020-06-24 NOTE — Telephone Encounter (Signed)
Spoke with patient and she is currently taking metformin 500mg  tabs BID. Patient is not having any other sx other then elevated BS and ankle swelling. She restarted HCTZ Wednesday. Ankles started swelling about a week ago. Swelling goes down some at night when she sleeps but doesn't stay away.

## 2020-06-24 NOTE — Telephone Encounter (Signed)
Per the patient her blood sugars are high over 200 staying at that level. This morning her fasting Blood sugar was 202. After taking medication and eating her Blood sugars went to 273. The patient does not believe she is on the right medication. She stated she is taking her HCTZ. However,she still has swelling in both ankles and they stay swollen all day long and only go down at night . She stated having charlie horses mostly in right leg and increased left leg swelling.

## 2020-06-27 NOTE — Telephone Encounter (Signed)
Spoke with patient this morning; she did get the message you left her. She has not been able to pick up ozempic yet due to money but states she will be able to get it before her nurse visit appt on 07/06/2020. She did increase the metformin but blood sugar readings have not changed much. States her leg swelling is better now.

## 2020-06-28 NOTE — Progress Notes (Signed)
I corrected the time statement. thx

## 2020-06-28 NOTE — Addendum Note (Signed)
Addended by: Denice Paradise A on: 06/28/2020 09:22 PM   Modules accepted: Level of Service

## 2020-07-06 ENCOUNTER — Other Ambulatory Visit (INDEPENDENT_AMBULATORY_CARE_PROVIDER_SITE_OTHER): Payer: Medicare Other

## 2020-07-06 ENCOUNTER — Other Ambulatory Visit: Payer: Self-pay

## 2020-07-06 ENCOUNTER — Ambulatory Visit (INDEPENDENT_AMBULATORY_CARE_PROVIDER_SITE_OTHER): Payer: Medicare Other

## 2020-07-06 DIAGNOSIS — R748 Abnormal levels of other serum enzymes: Secondary | ICD-10-CM

## 2020-07-06 DIAGNOSIS — Z8619 Personal history of other infectious and parasitic diseases: Secondary | ICD-10-CM

## 2020-07-06 DIAGNOSIS — R7989 Other specified abnormal findings of blood chemistry: Secondary | ICD-10-CM

## 2020-07-06 DIAGNOSIS — E1165 Type 2 diabetes mellitus with hyperglycemia: Secondary | ICD-10-CM

## 2020-07-06 LAB — BASIC METABOLIC PANEL
BUN: 15 mg/dL (ref 6–23)
CO2: 26 mEq/L (ref 19–32)
Calcium: 9.2 mg/dL (ref 8.4–10.5)
Chloride: 92 mEq/L — ABNORMAL LOW (ref 96–112)
Creatinine, Ser: 0.82 mg/dL (ref 0.40–1.20)
GFR: 69.22 mL/min (ref >60.00)
Glucose, Bld: 288 mg/dL — ABNORMAL HIGH (ref 70–99)
Potassium: 3.8 mEq/L (ref 3.5–5.1)
Sodium: 130 mEq/L — ABNORMAL LOW (ref 135–145)

## 2020-07-06 NOTE — Progress Notes (Addendum)
Patient presented for [facility] teaching. Patient demonstrated then performed injection. Patient voiced no concerns nor showed signs of distress during self-injection. Instructed patient that if further questions, they can call clinic to be given further directions in needed. Per Providers order from 06-24-20.   Reviewed.  Dr Nicki Reaper

## 2020-07-07 LAB — HEPATITIS B E ANTIGEN: Hep B E Ag: NEGATIVE

## 2020-07-07 LAB — HEPATITIS C ANTIBODY
Hepatitis C Ab: NONREACTIVE
SIGNAL TO CUT-OFF: 0.01 (ref <1.00)

## 2020-07-07 LAB — HEPATITIS B E ANTIBODY: Hep B E Ab: NEGATIVE

## 2020-07-07 LAB — HEPATITIS B SURFACE ANTIGEN: Hepatitis B Surface Ag: NONREACTIVE

## 2020-07-07 LAB — HEPATITIS B CORE ANTIBODY, IGM: Hep B C IgM: NEGATIVE

## 2020-07-11 ENCOUNTER — Telehealth: Payer: Self-pay | Admitting: Nurse Practitioner

## 2020-07-11 DIAGNOSIS — I10 Essential (primary) hypertension: Secondary | ICD-10-CM

## 2020-07-11 DIAGNOSIS — K219 Gastro-esophageal reflux disease without esophagitis: Secondary | ICD-10-CM

## 2020-07-11 MED ORDER — OMEPRAZOLE 20 MG PO CPDR: 20.0000 mg | DELAYED_RELEASE_CAPSULE | Freq: Every day | ORAL | 1 refills | Status: DC

## 2020-07-11 MED ORDER — RAMIPRIL 10 MG PO CAPS: 10.0000 mg | ORAL_CAPSULE | Freq: Two times a day (BID) | ORAL | 1 refills | Status: DC

## 2020-07-11 NOTE — Telephone Encounter (Signed)
Please let her know that I refilled her requested meds: ramipril and omeprazole.

## 2020-07-11 NOTE — Telephone Encounter (Signed)
Patient needs refills on medications that were filled by previous provider. Will Kim please refill ramipril (ALTACE) 10 MG capsule and  omeprazole (PRILOSEC) 20 MG capsul.

## 2020-07-12 NOTE — Telephone Encounter (Signed)
Patient aware.

## 2020-07-13 ENCOUNTER — Ambulatory Visit (INDEPENDENT_AMBULATORY_CARE_PROVIDER_SITE_OTHER): Payer: Medicare Other | Admitting: Pharmacist

## 2020-07-13 DIAGNOSIS — E785 Hyperlipidemia, unspecified: Secondary | ICD-10-CM | POA: Diagnosis not present

## 2020-07-13 DIAGNOSIS — E119 Type 2 diabetes mellitus without complications: Secondary | ICD-10-CM | POA: Diagnosis not present

## 2020-07-13 DIAGNOSIS — I10 Essential (primary) hypertension: Secondary | ICD-10-CM | POA: Diagnosis not present

## 2020-07-13 NOTE — Patient Instructions (Addendum)
Tiffany Gregory,   It was great talking with you today!  Our goal A1c is less than 7%. This corresponds with fasting sugars less than 130 and 2 hour after meal sugars less than 180. Please check your blood sugar 1-2 times daily. It may take 3-4 weeks of weekly Ozempic for it to reach a steady level in your body and for [place] to see the full therapeutic benefit.   We can pursue patient assistance from [employer] moving forward. For 2021, the yearly income cut off for a 2 person household size for this program is $69,680.  Our goal bad cholesterol, or LDL, is less than 70. This is why it is important to continue taking your atorvastatin. Let's discuss increasing the dose moving forward.   Feel free to call me with any questions or concerns. I look forward to our next visit!  Catie Darnelle Maffucci, PharmD, [place], CPP Direct line: [phone] Clinic phone: [phone]   Visit Information  Goals Addressed              This Visit's Progress     Patient Stated   .  PharmD "I want to improve my diabetes" (pt-stated)        CARE PLAN ENTRY (see longitudinal plan of care for additional care plan information)  Current Barriers:  . Social, financial, community barriers:  o Recent move from [place] to [place] in June. Stressful move, scammed by moving company.  o Works from home. Husband is retired and disabled. He had a stroke 3 years ago. Notes he has a biopsy coming up tomorrow. S/p tx w/ levetiracetam per outside chart review. o Reports head trauma, resulting intracranial hemorrhage w/ craniotomy x2 in [place] in February, slipped on ice. Hospital stay for about a month.  o Worried about cost of Ozempic next year. Notes that she will not fall into Coverage Gap this year, but worries about 2022 . Diabetes: uncontrolled; complicated by chronic medical conditions including HTN, HLD, hx Hep B, hx ICH, most recent A1c 8.8% . Most recent eGFR: 69 mL/min . Current antihyperglycemic regimen: metformin XR 500 mg  QAM, Ozempic 0.25 mg weekly o Notes she occasionally forgets evening dose of metformin d/t not eating consistent supper o Had been on metformin IR QAM, sometimes QPM . Current meal patterns: o Breakfast: consistent meal; Life cereal w/ apple juice; sometimes french toast + no sugar syrup; sometimes egg o Later lunch + supper, generally not eating supper meal.  . Current blood glucose readings:  o Fastings: 278 this morning;  . Cardiovascular risk reduction: o Current hypertensive regimen: ramipril 20 mg daily, HCTZ 25 mg daily  - Hyponatremic to 130 on recent BMP. Was low at 133 on outside lab report from 2020. Patient preferred to continue HCTZ d/t edema that develops w/o, even with compression stockings o Current hyperlipidemia regimen: atorvastatin 20 mg daily; last LDL not at goal <70 o Current antiplatelet regimen: ASA 81 mg daily  . Eye exam: DUE  . Nephropathy screening: DUE . GERD: omeprazole 40 mg daily   Pharmacist Clinical Goal(s):  Marland Kitchen Over the next 90 days, patient will work with PharmD and primary care provider to address optimized medication management  Interventions: . Comprehensive medication review performed, medication list updated in electronic medical record . Inter-disciplinary care team collaboration (see longitudinal plan of care) . Reviewed goal A1c, goal fasting, and goal 2 hour post prandial glucose goals. Patient just started Ozempic. Would avoid titrating both metformin and Ozempic at the same time d/t  duplicative GI upset. Continue metformin XR 500 mg QAM, continue Ozempic 0.25 mg for the next 3 weeks as prescribed, then increase to 0.5 mg weekly. Discussed that we could titrate metformin moving forward. Discussed taking with a meal to reduce risk of GI upset, and can take second dose with lunch instead of supper, if she doesn't eat a supper meal.  . Reviewed goal BP. Continue ramipril 20 mg daily and HCTZ 25 mg daily.  . Discussed benefit of BID pill box to help  remember to take evening medication . Discussed why PCP decided to d/c glimepiride d/t risk of hypoglycemia, weight gain, beta cell burn out. Patient verbalizes understanding . Discussed [employer] patient assistance program. Can pursue in 2022 if patient meets criteria. . Provided empathetic listening as she discussed recent stressors regarding work, her move, and husband's health   Patient Self Care Activities:  . Patient will check blood glucose BID, document, and provide at future appointments . Patient will focus on medication adherence  . Patient will take medications as prescribed . Patient will report any questions or concerns to provider   Initial goal documentation        Tiffany Gregory was given information about Chronic Care Management services today including:  1. CCM service includes personalized support from designated clinical staff supervised by her physician, including individualized plan of care and coordination with other care providers 2. 24/7 contact phone numbers for assistance for urgent and routine care needs. 3. Service will only be billed when office clinical staff spend 20 minutes or more in a month to coordinate care. 4. Only one practitioner may furnish and bill the service in a calendar month. 5. The patient may stop CCM services at any time (effective at the end of the month) by phone call to the office staff. 6. The patient will be responsible for cost sharing (co-pay) of up to 20% of the service fee (after annual deductible is met).  Patient agreed to services and verbal consent obtained.   The patient verbalized understanding of instructions provided today and agreed to receive a mailed copy of patient instruction and/or educational materials.   Plan: - Scheduled f/u call in ~ 6 weeks  Catie Darnelle Maffucci, PharmD, [place], [place] Pharmacist [place] [phone]

## 2020-07-13 NOTE — Chronic Care Management (AMB) (Signed)
Chronic Care Management   Note  07/13/2020 Name: Tiffany Gregory MRN: [id number] DOB: 01/06/1951   Subjective:  Tiffany Gregory is a 69 y.o. year old female who is a primary care patient of Marval Regal, NP. The CCM team was consulted for assistance with chronic disease management and care coordination needs.     Contacted patient for initial medication access and medication management support  [name] was given information about Chronic Care Management services today including:  1. CCM service includes personalized support from designated clinical staff supervised by her physician, including individualized plan of care and coordination with other care providers 2. 24/7 contact phone numbers for assistance for urgent and routine care needs. 3. Service will only be billed when office clinical staff spend 20 minutes or more in a month to coordinate care. 4. Only one practitioner may furnish and bill the service in a calendar month. 5. The patient may stop CCM services at any time (effective at the end of the month) by phone call to the office staff. 6. The patient will be responsible for cost sharing (co-pay) of up to 20% of the service fee (after annual deductible is met).  Patient agreed to services and verbal consent obtained.   Review of patient status, including review of consultants reports, laboratory and other test data, was performed as part of comprehensive evaluation and provision of chronic care management services.   SDOH (Social Determinants of Health) assessments and interventions performed:  SDOH Interventions     Most Recent Value  SDOH Interventions  Financial Strain Interventions Intervention Not Indicated  Stress Interventions Other (Comment)  [empathetic listening]       Objective:  Lab Results  Component Value Date   CREATININE 0.82 07/06/2020   CREATININE 0.76 06/20/2020   CREATININE 0.82 06/14/2020    Lab Results  Component Value Date    HGBA1C 8.8 (H) 06/14/2020       Component Value Date/Time   CHOL 185 06/14/2020 1117   TRIG 286.0 (H) 06/14/2020 1117   HDL 39.10 06/14/2020 1117   CHOLHDL 5 06/14/2020 1117   VLDL 57.2 (H) 06/14/2020 1117   LDLDIRECT 109.0 06/14/2020 1117    Clinical ASCVD: No  The 10-year ASCVD risk score [name], et al., 2013) is: 22.6%   Values used to calculate the score:     Age: 48 years     Sex: Female     Is Non-Hispanic African American: No     Diabetic: Yes     Tobacco smoker: No     Systolic Blood Pressure: 229 mmHg     Is BP treated: Yes     HDL Cholesterol: 39.1 mg/dL     Total Cholesterol: 185 mg/dL    BP Readings from Last 3 Encounters:  06/21/20 137/82  06/14/20 128/76    Allergies  Allergen Reactions  . Sulfa Antibiotics     Medications Reviewed Today    Reviewed by [name], RPH-CPP (Pharmacist) on 07/13/20 at 1008  Med List Status: <None>  Medication Order Taking? Sig Documenting Provider Last Dose Status Informant  aspirin EC 81 MG tablet [id number] Yes Take 81 mg by mouth daily. Swallow whole. [provider] Taking Active   atorvastatin (LIPITOR) 20 MG tablet [id number] Yes Take 1 tablet (20 mg total) by mouth daily. Marval Regal, NP Taking Active   Blood Glucose Monitoring Suppl (ONE TOUCH ULTRA 2) w/Device KIT [id number] Yes by Does not apply route. [provider] Taking Active  glucose blood test strip [id number] Yes 1 each by Other route as needed for other. Use as instructed. One touch ultra blue in vitro strips [provider] Taking Active   hydrochlorothiazide (HYDRODIURIL) 25 MG tablet [id number] Yes Take 25 mg by mouth daily.  [provider] Taking Active   Insulin Pen Needle (PEN NEEDLES) 31G X 6 MM MISC [id number] Yes For Ozempic to inject into the skin once weekly Marval Regal, NP Taking Active   metFORMIN (GLUCOPHAGE XR) 500 MG 24 hr tablet [id number] Yes Take 1000 mg twice daily with meals.  Marval Regal, NP Taking Active            Med Note [name]   Wed Jul 13, 2020 10:06 AM) Taking XR 500 mg QAM  omeprazole (PRILOSEC) 20 MG capsule [id number] Yes Take 1 capsule (20 mg total) by mouth daily. Marval Regal, NP Taking Active   OneTouch Delica Lancets 53G MISC [id number] Yes by Does not apply route. [provider] Taking Active   ramipril (ALTACE) 10 MG capsule [id number] Yes Take 1 capsule (10 mg total) by mouth 2 (two) times daily. Marval Regal, NP Taking Active   Semaglutide,0.25 or 0.5MG/DOS, (OZEMPIC, 0.25 OR 0.5 MG/DOSE,) 2 MG/1.5ML SOPN [id number] Yes Inject 0.25 mg into the skin once a week for 30 days, THEN 0.5 mg once a week. Inject 0.25 mg into the skin once a week for 4 weeks. Then, increase to 0.50 mg into the skin  once a week.Marval Regal, NP Taking Active            Assessment:   Goals Addressed              This Visit's Progress     Patient Stated   .  PharmD "I want to improve my diabetes" (pt-stated)        CARE PLAN ENTRY (see longitudinal plan of care for additional care plan information)  Current Barriers:  . Social, financial, community barriers:  o Recent move from [place] to [place] in June. Stressful move, scammed by moving company.  o Works from home. Husband is retired and disabled. He had a stroke 3 years ago. Notes he has a biopsy coming up tomorrow. S/p tx w/ levetiracetam per outside chart review. o Reports head trauma, resulting intracranial hemorrhage w/ craniotomy x2 in [place] in February, slipped on ice. Hospital stay for about a month.  o Worried about cost of Ozempic next year. Notes that she will not fall into Coverage Gap this year, but worries about 2022 . Diabetes: uncontrolled; complicated by chronic medical conditions including HTN, HLD, hx Hep B, hx ICH, most recent A1c 8.8% . Most recent eGFR: 69 mL/min . Current antihyperglycemic regimen: metformin XR 500 mg QAM, Ozempic 0.25 mg  weekly o Notes she occasionally forgets evening dose of metformin d/t not eating consistent supper o Had been on metformin IR QAM, sometimes QPM . Current meal patterns: o Breakfast: consistent meal; Life cereal w/ apple juice; sometimes french toast + no sugar syrup; sometimes egg o Later lunch + supper, generally not eating supper meal.  . Current blood glucose readings:  o Fastings: 278 this morning;  . Cardiovascular risk reduction: o Current hypertensive regimen: ramipril 20 mg daily, HCTZ 25 mg daily  - Hyponatremic to 130 on recent BMP. Was low at 133 on outside lab report from 2020. Patient preferred to continue HCTZ d/t edema that develops w/o, even with compression stockings o Current  hyperlipidemia regimen: atorvastatin 20 mg daily; last LDL not at goal <70 o Current antiplatelet regimen: ASA 81 mg daily  . Eye exam: DUE  . Nephropathy screening: DUE . GERD: omeprazole 40 mg daily   Pharmacist Clinical Goal(s):  Marland Kitchen Over the next 90 days, patient will work with PharmD and primary care provider to address optimized medication management  Interventions: . Comprehensive medication review performed, medication list updated in electronic medical record . Inter-disciplinary care team collaboration (see longitudinal plan of care) . Reviewed goal A1c, goal fasting, and goal 2 hour post prandial glucose goals. Patient just started Ozempic. Would avoid titrating both metformin and Ozempic at the same time d/t duplicative GI upset. Continue metformin XR 500 mg QAM, continue Ozempic 0.25 mg for the next 3 weeks as prescribed, then increase to 0.5 mg weekly. Discussed that we could titrate metformin moving forward. Discussed taking with a meal to reduce risk of GI upset, and can take second dose with lunch instead of supper, if she doesn't eat a supper meal.  . Reviewed goal BP. Continue ramipril 20 mg daily and HCTZ 25 mg daily.  . Discussed benefit of BID pill box to help remember to take  evening medication . Discussed why PCP decided to d/c glimepiride d/t risk of hypoglycemia, weight gain, beta cell burn out. Patient verbalizes understanding . Discussed [employer] patient assistance program. Can pursue in 2022 if patient meets criteria. . Provided empathetic listening as she discussed recent stressors regarding work, her move, and husband's health   Patient Self Care Activities:  . Patient will check blood glucose BID, document, and provide at future appointments . Patient will focus on medication adherence  . Patient will take medications as prescribed . Patient will report any questions or concerns to provider   Initial goal documentation        Plan: - Scheduled f/u call in ~ 6 weeks  Catie Darnelle Maffucci, PharmD, [place], [place] Pharmacist [place] [phone]

## 2020-07-22 ENCOUNTER — Encounter: Payer: Self-pay | Admitting: Nurse Practitioner

## 2020-07-22 ENCOUNTER — Other Ambulatory Visit: Payer: Self-pay

## 2020-07-22 ENCOUNTER — Ambulatory Visit (INDEPENDENT_AMBULATORY_CARE_PROVIDER_SITE_OTHER): Payer: Medicare Other | Admitting: Nurse Practitioner

## 2020-07-22 VITALS — BP 102/60 | HR 88 | Temp 98.0°F | Ht 65.0 in | Wt 168.0 lb

## 2020-07-22 DIAGNOSIS — I10 Essential (primary) hypertension: Secondary | ICD-10-CM

## 2020-07-22 DIAGNOSIS — E119 Type 2 diabetes mellitus without complications: Secondary | ICD-10-CM

## 2020-07-22 DIAGNOSIS — K219 Gastro-esophageal reflux disease without esophagitis: Secondary | ICD-10-CM

## 2020-07-22 DIAGNOSIS — E871 Hypo-osmolality and hyponatremia: Secondary | ICD-10-CM | POA: Diagnosis not present

## 2020-07-22 DIAGNOSIS — Z23 Encounter for immunization: Secondary | ICD-10-CM

## 2020-07-22 DIAGNOSIS — E785 Hyperlipidemia, unspecified: Secondary | ICD-10-CM

## 2020-07-22 MED ORDER — ATORVASTATIN CALCIUM 20 MG PO TABS: 20.0000 mg | ORAL_TABLET | Freq: Every day | ORAL | 1 refills | Status: DC

## 2020-07-22 MED ORDER — OMEPRAZOLE 20 MG PO CPDR: 20.0000 mg | DELAYED_RELEASE_CAPSULE | Freq: Every day | ORAL | 1 refills | Status: DC

## 2020-07-22 MED ORDER — RAMIPRIL 10 MG PO CAPS: 10.0000 mg | ORAL_CAPSULE | Freq: Two times a day (BID) | ORAL | 1 refills | Status: DC

## 2020-07-22 NOTE — Patient Instructions (Addendum)
Please call and schedule your 3D mammogram as discussed.   Tiffany Gregory  [address] [place], [place]  Labs in 2 weeks off of the HCTZ- monitor BP and ankle swelling.   DM: Same plan as you are doing.  Office visit in 3 months

## 2020-07-22 NOTE — Progress Notes (Signed)
Established Patient Office Visit  Subjective:  Patient ID: Tiffany Gregory, female    DOB: December 30, 1950  Age: 69 y.o. MRN: [id number]  CC:  Chief Complaint  Patient presents with  . Follow-up    diabetes    HPI Tiffany Gregory presents for recheck of diabetes, slight hyponatremia off of HCTZ.  Her husband just had a medical procedure and suspected malignancy.  He will begin oncology radiation treatments.  Patient has been under stress. HA today and daily for the last 3-4 days.Tension HA across the forehead or maybe sinus and no congestion. She takes Tylenol x2 days and it resolves it. She used to have chronic HA a lot. Due for eye exam 08/24/20    DM: Not at goal .  Metformin XR 500 mg daily and taking Ozempic 0.25 mg weekly x3 weeks and will titrate to 0.50 mg weekly. She is tolerating the dose well.  FBS: 215- 260.  There is room for improvement in her diet but she is making an effort to eat more vegetables and diabetic diet.Indigestion/burping-daily- decreased Metformin and it is better. No wave of nausea. No abd pain.  Lab Results  Component Value Date   HGBA1C 8.8 (H) 06/14/2020    Fatty liver/elevated LFTS/Hx Hep B: Seen chronic care management to help with weight loss, working on healthier diet, trying to exercise.   She does have history of elevated LFTs, and specialty liver labs from 2020have shown no autoimmune, ceruloplasmin,hepatitis B and C,  CRP, celiac, or elevated ferritin findings. She reports having Hepatitis B when she was 69 yo likely from IV drug use experimentation.  She reports she was hospitalized and treated with unknown medications.  She has never seen a liver specialist.  No current alcohol or substance use.  Recent liver labs show no immunity to hepatitis A or B. Normal synthetic liver function with platelet count 277, albumin 4.3, and chronic mild elevation AST, ALT appear stable.  06/20/2020: Hepatitis A total antibody: Nonreactive  Hepatitis B core  antibody: positive Hepatitis B surface antibody quantitative : less than 3.1 no immunity Ferritin: 16 PT/INR: 12.7 and 1.0 Immunoglobulins: Pending Alpha-1 antitrypsin: 119 and MM  06/20/2020: ABD [place] : Negative gallstones, mild increased echogenicity of the liver may represent mild fatty infiltration.  No biliary dilatation.  Lab Results  Component Value Date   ALT 50 (H) 06/14/2020   AST 49 (H) 06/14/2020   ALKPHOS 53 06/14/2020   BILITOT 0.3 06/14/2020    BMI 27/Overweight: Stable Wt Readings from Last 3 Encounters:  07/22/20 168 lb (76.2 kg)  06/21/20 167 lb (75.8 kg)  06/14/20 167 lb (75.8 kg)   Hx of constipation/IBS dx years ago: on stool softener and working well. Colon 2013:due 202   Past Medical History:  Diagnosis Date  . Allergy   . Asthma   . Diabetes mellitus without complication ([facility])   . Elevated liver enzymes   . Hepatic steatosis   . History of hepatitis B 1971   She had IV drug use experimentation and was hosptialized and reports treated.   . Hyperlipidemia   . Hypertension     Past Surgical History:  Procedure Laterality Date  . ABDOMINAL HYSTERECTOMY    . BREAST BIOPSY    . CRANIECTOMY    . NASAL FRACTURE SURGERY  1975  . NASAL SINUS SURGERY  2010    Family History  Problem Relation Age of Onset  . Diabetes Mother   . Heart attack Mother   . Diabetes  Father   . Heart disease Father   . Hypertension Father   . Diabetes Sister   . COPD Sister   . Heart disease Sister   . Diabetes Brother   . Heart attack Maternal Grandmother   . Cancer Maternal Grandfather   . Heart attack Paternal Grandmother   . Cancer Paternal Grandfather   . Heart attack Brother   . Heart disease Brother     Social History   Socioeconomic History  . Marital status: Married    Spouse name: Not on file  . Number of children: Not on file  . Years of education: Not on file  . Highest education level: Bachelor's degree (e.g., BA, AB, BS)  Occupational History   . Not on file  Tobacco Use  . Smoking status: Never Smoker  . Smokeless tobacco: Never Used  Vaping Use  . Vaping Use: Never used  Substance and Sexual Activity  . Alcohol use: Not Currently  . Drug use: Never  . Sexual activity: Not Currently    Partners: Male  Other Topics Concern  . Not on file  Social History Narrative  . Not on file   Social Determinants of Health   Financial Resource Strain: Low Risk   . Difficulty of Paying Living Expenses: Not very hard  Food Insecurity:   . Worried About Charity fundraiser in the Last Year: Not on file  . Ran Out of Food in the Last Year: Not on file  Transportation Needs:   . Lack of Transportation (Medical): Not on file  . Lack of Transportation (Non-Medical): Not on file  Physical Activity:   . Days of Exercise per Week: Not on file  . Minutes of Exercise per Session: Not on file  Stress: Stress Concern Present  . Feeling of Stress : To some extent  Social Connections:   . Frequency of Communication with Friends and Family: Not on file  . Frequency of Social Gatherings with Friends and Family: Not on file  . Attends Religious Services: Not on file  . Active Member of Clubs or Organizations: Not on file  . Attends Archivist Meetings: Not on file  . Marital Status: Not on file  Intimate Partner Violence:   . Fear of Current or Ex-Partner: Not on file  . Emotionally Abused: Not on file  . Physically Abused: Not on file  . Sexually Abused: Not on file    Outpatient Medications Prior to Visit  Medication Sig Dispense Refill  . aspirin EC 81 MG tablet Take 81 mg by mouth daily. Swallow whole.    . Blood Glucose Monitoring Suppl (ONE TOUCH ULTRA 2) w/Device KIT by Does not apply route.    Marland Kitchen glucose blood test strip 1 each by Other route as needed for other. Use as instructed. One touch ultra blue in vitro strips    . hydrochlorothiazide (HYDRODIURIL) 25 MG tablet Take 25 mg by mouth daily.     . Insulin Pen Needle  (PEN NEEDLES) 31G X 6 MM MISC For Ozempic to inject into the skin once weekly 100 each 0  . metFORMIN (GLUCOPHAGE XR) 500 MG 24 hr tablet Take 1000 mg twice daily with meals. 180 tablet 1  . OneTouch Delica Lancets 10C MISC by Does not apply route.    . Semaglutide,0.25 or 0.5MG/DOS, (OZEMPIC, 0.25 OR 0.5 MG/DOSE,) 2 MG/1.5ML SOPN Inject 0.25 mg into the skin once a week for 30 days, THEN 0.5 mg once a week. Inject 0.25  mg into the skin once a week for 4 weeks. Then, increase to 0.50 mg into the skin  once a week.Marland Kitchen 2.25 mL 0  . atorvastatin (LIPITOR) 20 MG tablet Take 1 tablet (20 mg total) by mouth daily. 90 tablet 1  . omeprazole (PRILOSEC) 20 MG capsule Take 1 capsule (20 mg total) by mouth daily. 90 capsule 1  . ramipril (ALTACE) 10 MG capsule Take 1 capsule (10 mg total) by mouth 2 (two) times daily. 180 capsule 1   No facility-administered medications prior to visit.    Allergies  Allergen Reactions  . Sulfa Antibiotics     Review of Systems Pertinent positives noted in history of present illness and otherwise negative.   Objective:    Physical Exam Vitals reviewed.  Constitutional:      Appearance: Normal appearance.  HENT:     Head: Normocephalic.  Cardiovascular:     Rate and Rhythm: Normal rate and regular rhythm.     Pulses: Normal pulses.     Heart sounds: Normal heart sounds.  Pulmonary:     Effort: Pulmonary effort is normal.     Breath sounds: Normal breath sounds.  Abdominal:     Palpations: Abdomen is soft.     Tenderness: There is no abdominal tenderness.  Musculoskeletal:     Cervical back: Normal range of motion and neck supple.  Skin:    General: Skin is warm and dry.  Neurological:     General: No focal deficit present.     Mental Status: She is alert and oriented to person, place, and time.  Psychiatric:        Mood and Affect: Mood normal.        Behavior: Behavior normal.     BP 102/60 (BP Location: Left Arm, Patient Position: Sitting, Cuff  Size: Normal)   Pulse 88   Temp 98 F (36.7 C) (Oral)   Ht 5' 5"  (1.651 m)   Wt 168 lb (76.2 kg)   SpO2 99%   BMI 27.96 kg/m  Wt Readings from Last 3 Encounters:  07/22/20 168 lb (76.2 kg)  06/21/20 167 lb (75.8 kg)  06/14/20 167 lb (75.8 kg)     Health Maintenance Due  Topic Date Due  . FOOT EXAM  Never done  . OPHTHALMOLOGY EXAM  Never done  . TETANUS/TDAP  Never done  . MAMMOGRAM  Never done  . COLONOSCOPY  Never done  . DEXA SCAN  Never done  . PNA vac Low Risk Adult (2 of 2 - PPSV23) 05/15/2018    There are no preventive care reminders to display for this patient.  Lab Results  Component Value Date   TSH 1.88 06/14/2020   Lab Results  Component Value Date   WBC 6.8 06/14/2020   HGB 12.8 06/14/2020   HCT 37.6 06/14/2020   MCV 83.0 06/14/2020   PLT 277.0 06/14/2020   Lab Results  Component Value Date   NA 130 (L) 07/06/2020   K 3.8 07/06/2020   CO2 26 07/06/2020   GLUCOSE 288 (H) 07/06/2020   BUN 15 07/06/2020   CREATININE 0.82 07/06/2020   BILITOT 0.3 06/14/2020   ALKPHOS 53 06/14/2020   AST 49 (H) 06/14/2020   ALT 50 (H) 06/14/2020   PROT 7.8 06/14/2020   ALBUMIN 4.3 06/14/2020   CALCIUM 9.2 07/06/2020   ANIONGAP 9 06/20/2020   GFR 69.22 07/06/2020   Lab Results  Component Value Date   CHOL 185 06/14/2020   Lab Results  Component Value Date   HDL 39.10 06/14/2020   No results found for: [facility] Lab Results  Component Value Date   TRIG 286.0 (H) 06/14/2020   Lab Results  Component Value Date   CHOLHDL 5 06/14/2020   Lab Results  Component Value Date   HGBA1C 8.8 (H) 06/14/2020      Assessment & Plan:   Problem List Items Addressed This Visit      Cardiovascular and Mediastinum   Essential hypertension   Relevant Medications   ramipril (ALTACE) 10 MG capsule   atorvastatin (LIPITOR) 20 MG tablet     Endocrine   Type 2 diabetes mellitus without complication, without long-term current use of insulin (HCC)   Relevant  Medications   ramipril (ALTACE) 10 MG capsule   atorvastatin (LIPITOR) 20 MG tablet     Other   Hyperlipidemia   Relevant Medications   ramipril (ALTACE) 10 MG capsule   atorvastatin (LIPITOR) 20 MG tablet    Other Visit Diagnoses    Hyponatremia    -  Primary   Relevant Orders   Basic metabolic panel   Gastroesophageal reflux disease, unspecified whether esophagitis present       Relevant Medications   omeprazole (PRILOSEC) 20 MG capsule   Need for immunization against influenza       Relevant Orders   Flu Vaccine QUAD High Dose(Fluad) (Completed)      Meds ordered this encounter  Medications  . DISCONTD: atorvastatin (LIPITOR) 20 MG tablet    Sig: Take 1 tablet (20 mg total) by mouth daily.    Dispense:  90 tablet    Refill:  1    Order Specific Question:   Supervising Provider    Answer:   Einar Pheasant [id number]  . DISCONTD: ramipril (ALTACE) 10 MG capsule    Sig: Take 1 capsule (10 mg total) by mouth 2 (two) times daily.    Dispense:  180 capsule    Refill:  1    Order Specific Question:   Supervising Provider    Answer:   Einar Pheasant [id number]  . DISCONTD: omeprazole (PRILOSEC) 20 MG capsule    Sig: Take 1 capsule (20 mg total) by mouth daily.    Dispense:  90 capsule    Refill:  1    Order Specific Question:   Supervising Provider    Answer:   Einar Pheasant [id number]  . ramipril (ALTACE) 10 MG capsule    Sig: Take 1 capsule (10 mg total) by mouth 2 (two) times daily.    Dispense:  180 capsule    Refill:  1  . omeprazole (PRILOSEC) 20 MG capsule    Sig: Take 1 capsule (20 mg total) by mouth daily.    Dispense:  90 capsule    Refill:  1  . atorvastatin (LIPITOR) 20 MG tablet    Sig: Take 1 tablet (20 mg total) by mouth daily.    Dispense:  90 tablet    Refill:  1   Offered empathetic listening today.  She was reminded to set up a mammogram when she is able to between her husband's appointments.  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  She was reminded to take care of herself as well.  She and her husband  plan to go on a little vacation to see her aunt.    Please call and schedule your 3D mammogram as discussed. Provided phone number and address.   Labs in 2  weeks off of the HCTZ- monitor BP and ankle swelling.  She has no ankle swelling.  Doing well off the HCTZ.  Blood pressure is good.  DM: Continue current treatment plan.  Metformin 500 mg twice daily, did not tolerate going up.  Advancing Ozempic as tolerated.  Discussed GI referral fatty liver, history of hepatitis B, and mild stable chronic elevated AST ALT.  She is working on diabetes.  Monitoring sodium.  Follow-up: Return in about 3 months (around 10/22/2020).   This visit occurred during the SARS-CoV-2 public health emergency.  Safety protocols were in place, including screening questions prior to the visit, additional usage of staff PPE, and extensive cleaning of exam room while observing appropriate contact time as indicated for disinfecting solutions.   Denice Paradise, NP

## 2020-07-26 ENCOUNTER — Telehealth: Payer: Self-pay | Admitting: Nurse Practitioner

## 2020-07-26 NOTE — Telephone Encounter (Signed)
Left message for patient to call back and schedule Medicare Annual Wellness Visit (AWV)   This should be a telephone visit only=30 minutes.  No hx of AWV; please schedule at anytime with Denisa O'Brien-Blaney at [facility]  AWV-I PER [place] AS OF 12/06/17

## 2020-07-29 ENCOUNTER — Other Ambulatory Visit: Payer: Self-pay | Admitting: Nurse Practitioner

## 2020-08-12 ENCOUNTER — Other Ambulatory Visit (INDEPENDENT_AMBULATORY_CARE_PROVIDER_SITE_OTHER): Payer: Medicare Other

## 2020-08-12 ENCOUNTER — Other Ambulatory Visit: Payer: Self-pay

## 2020-08-12 DIAGNOSIS — E871 Hypo-osmolality and hyponatremia: Secondary | ICD-10-CM | POA: Diagnosis not present

## 2020-08-12 DIAGNOSIS — R748 Abnormal levels of other serum enzymes: Secondary | ICD-10-CM

## 2020-08-12 DIAGNOSIS — Z8619 Personal history of other infectious and parasitic diseases: Secondary | ICD-10-CM

## 2020-08-12 LAB — BASIC METABOLIC PANEL
BUN: 8 mg/dL (ref 6–23)
CO2: 28 mEq/L (ref 19–32)
Calcium: 9.5 mg/dL (ref 8.4–10.5)
Chloride: 99 mEq/L (ref 96–112)
Creatinine, Ser: 0.79 mg/dL (ref 0.40–1.20)
GFR: 76.76 mL/min (ref >60.00)
Glucose, Bld: 147 mg/dL — ABNORMAL HIGH (ref 70–99)
Potassium: 4.2 mEq/L (ref 3.5–5.1)
Sodium: 137 mEq/L (ref 135–145)

## 2020-08-15 ENCOUNTER — Other Ambulatory Visit: Payer: Self-pay

## 2020-08-15 DIAGNOSIS — E119 Type 2 diabetes mellitus without complications: Secondary | ICD-10-CM

## 2020-08-15 MED ORDER — GLUCOSE BLOOD VI STRP: 1.0000 | ORAL_STRIP | 3 refills | Status: DC | PRN

## 2020-08-15 NOTE — Addendum Note (Signed)
Addended by: Denice Paradise A on: 08/15/2020 08:18 PM   Modules accepted: Orders

## 2020-08-19 ENCOUNTER — Encounter: Payer: Self-pay | Admitting: Nurse Practitioner

## 2020-08-24 ENCOUNTER — Ambulatory Visit: Payer: Medicare Other | Admitting: Pharmacist

## 2020-08-24 DIAGNOSIS — E785 Hyperlipidemia, unspecified: Secondary | ICD-10-CM

## 2020-08-24 DIAGNOSIS — I10 Essential (primary) hypertension: Secondary | ICD-10-CM

## 2020-08-24 DIAGNOSIS — E119 Type 2 diabetes mellitus without complications: Secondary | ICD-10-CM

## 2020-08-24 LAB — HM DIABETES EYE EXAM

## 2020-08-24 NOTE — Chronic Care Management (AMB) (Signed)
**Note Tiffany-Identified via Obfuscation** Chronic Care Management   Pharmacy Note  08/24/2020 Name: Tiffany Gregory MRN: [id number] DOB: [birth year]   Subjective:  Tiffany Gregory is a 69 y.o. year old female who is a primary care patient of Tiffany Regal, NP. The CCM team was consulted for assistance with chronic disease management and care coordination needs.    Engaged with patient by telephone for follow up visit in response to provider referral for pharmacy case management and/or care coordination services.   Consent to Services:  Tiffany Gregory was given information about Chronic Care Management services, agreed to services, and gave verbal consent prior to initiation of services on 07/13/2020. Please see initial visit note for detailed documentation.   Review of patient status, including review of consultants reports, laboratory and other test data, was performed as part of comprehensive evaluation and provision of chronic care management services.   SDOH (Social Determinants of Health) assessments and interventions performed:  SDOH Interventions     Most Recent Value  SDOH Interventions  Financial Strain Interventions Other (Comment)  [worries about affordability in 2022. Will re-evaluate at next visitq]       Objective:  Lab Results  Component Value Date   CREATININE 0.79 08/12/2020   CREATININE 0.82 07/06/2020   CREATININE 0.76 06/20/2020    Lab Results  Component Value Date   HGBA1C 8.8 (H) 06/14/2020       Component Value Date/Time   CHOL 185 06/14/2020 1117   TRIG 286.0 (H) 06/14/2020 1117   HDL 39.10 06/14/2020 1117   CHOLHDL 5 06/14/2020 1117   VLDL 57.2 (H) 06/14/2020 1117   LDLDIRECT 109.0 06/14/2020 1117    Clinical ASCVD: No  The 10-year ASCVD risk score Tiffany Gregory DC Jr., et al., 2013) is: 13.2%   Values used to calculate the score:     Age: 13 years     Sex: Female     Is Non-Hispanic African American: No     Diabetic: Yes     Tobacco smoker: No     Systolic Blood Pressure: 740 mmHg      Is BP treated: Yes     HDL Cholesterol: 39.1 mg/dL     Total Cholesterol: 185 mg/dL     BP Readings from Last 3 Encounters:  07/22/20 102/60  06/21/20 137/82  06/14/20 128/76    Assessment:   Allergies  Allergen Reactions  . Sulfa Antibiotics     Medications Reviewed Today    Reviewed by Tiffany Gregory, RPH-CPP (Pharmacist) on 08/24/20 at 1039  Med List Status: <None>  Medication Order Taking? Sig Documenting Provider Last Dose Status Informant  aspirin EC 81 MG tablet [id number] Yes Take 81 mg by mouth daily. Swallow whole. [provider] Taking Active   atorvastatin (LIPITOR) 20 MG tablet [id number] Yes Take 1 tablet (20 mg total) by mouth daily. Tiffany Regal, NP Taking Active   Blood Glucose Monitoring Suppl (ONE TOUCH ULTRA 2) w/Device KIT [id number] Yes by Does not apply route. [provider] Taking Active   glucose blood test strip [id number] Yes 1 each by Other route as needed for other. Use as instructed. One touch ultra blue in vitro strips Tiffany Regal, NP Taking Active         Discontinued 08/24/20 1038 (Change in therapy)         Discontinued 08/24/20 1039 (No longer needed (for PRN medications))   metFORMIN (GLUCOPHAGE XR) 500 MG 24 hr tablet [id number] Yes Take 1000 mg twice daily with meals. Tiffany Gregory,  Tiffany Harder, NP Taking Active            Med Note Tiffany Gregory   Wed Jul 13, 2020 10:06 AM) Taking XR 500 mg QAM  omeprazole (PRILOSEC) 20 MG capsule [id number] Yes Take 1 capsule (20 mg total) by mouth daily. Tiffany Regal, NP Taking Active   OneTouch Delica Lancets 83T MISC [id number] Yes by Does not apply route. [provider] Taking Active   OZEMPIC, 0.25 OR 0.5 MG/DOSE, 2 MG/1.5ML SOPN [id number] Yes INJECT 0.25 MG INTO THE SKIN ONCE A WEEK FOR 4 WEEKS, THEN INCREASE TO 0.5MG WEEKLY Tiffany Regal, NP Taking Active            Med Note (Tiffany Gregory   Wed Aug 24, 2020 10:35 AM) 0.5 weekly  ramipril (ALTACE) 10  MG capsule [id number] Yes Take 1 capsule (10 mg total) by mouth 2 (two) times daily. Tiffany Regal, NP Taking Active            Med Note Tiffany Gregory Aug 24, 2020 10:39 AM) 20 mg once daily          Patient Active Problem List   Diagnosis Date Noted  . Hyperlipidemia 06/22/2020  . Elevated liver enzymes 06/22/2020  . History of hepatitis B virus infection 06/22/2020  . Overweight with body mass index (BMI) of 27 to 27.9 in adult 06/22/2020  . Hepatic steatosis 06/15/2020  . Encounter for medical examination to establish care 06/14/2020  . Type 2 diabetes mellitus without complication, without long-term current use of insulin ([place]) 06/14/2020  . Essential hypertension 06/14/2020  . Skin lesion 06/14/2020    Medication Assistance: None required. Patient affirms current coverage meets needs.   Patient Care Plan: Medication Management    Problem Identified: Diabetes, Hypertension     Long-Range Goal: Disease Progression Prevention   Start Date: 07/13/2020  Recent Progress: On track  Priority: High  Note:   Current Barriers:  . Concerns regarding her ability to independently afford treatment regimen . Unable to achieve control of diabetes  . Complex medical history including hx hepatitis B, history of intracranial hemorrhage   Pharmacist Clinical Goal(s):  Marland Kitchen Over the next 90 days, patient will verbalize ability to afford treatment regimen through collaboration with PharmD and provider. . Over the next 90 days, patient will achieve control of diabetes as evidenced by improvement in A1c through collaboration with PharmD and provider  Interventions: . Inter-disciplinary care team collaboration (see longitudinal plan of care) . Comprehensive medication review performed; medication list updated in electronic medical record  Diabetes: . Uncontrolled but improved; current treatment: metformin XR 500 mg QAM, Ozempic 0.5 mg weekly (x2 weeks). Endorses some increased  bloating and cramping. Notes that she used to take Miralax daily while in [place], so has restarted doing this a few days ago. Also taking docusate o Hx glimepiride (d/c d/t hypoglycemia)  . Current glucose readings: fasting glucose: 150-180s, post prandial glucose: not checking as frequently . Denies hypoglycemic symptoms . Confirms decreased appetite and increased sensation of fullness. . Antiplatelet therapy: aspirin 81 mg daily . Recommended to continue current regimen, as full benefit of Ozempic dose likely not achieved yet . Discussed that GI symptoms may improve with continued dosing. Praised for inclusion of Miralax if this was previously recommended to her. Also discussed increasing fiber and hydration. Suggested Gas-X for treatment of bloating if gas related.   Hypertension: . Controlled; current treatment: ramipril 20 mg daily (2  10 mg tabs daily) o Previously on HCTZ, d/c d/t hyponatremia . Recommended to continue current regimen  Hyperlipidemia: . Controlled; current treatment: atorvastatin 20 mg daily  . Recommended to continue current regimen at this time.  Will discuss increasing statin intensity moving forward.   GERD: . Controlled on omeprazole 40 mg daily.  . Recommend to continue current regimen  Patient Goals/Self-Care Activities . Over the next 90 days, patient will:  - take medications as prescribed check blood glucose BID, document, and provide at future appointments  Follow Up Plan: Telephone follow up appointment with care management team member scheduled for: ~8 weeks      Plan: Telephone follow up appointment with care management team member scheduled for:~ 8 weeks  Catie Darnelle Maffucci, PharmD, [place], CPP Clinical Pharmacist [place] [phone]

## 2020-08-24 NOTE — Patient Instructions (Addendum)
Tiffany Gregory,   It was great talking with you today!  Our goal A1c is less than 7%. This corresponds with fasting sugars less than 130 and 2 hour after meal sugars less than 180. Please check your blood sugar twice daily. I anticipate continued improvement in your sugar readings, and improvement in your stomach symptoms, as you are on this dose of Ozempic for longer.   In January, we can evaluate if you would qualify for assistance with Ozempic for 2022. If your total household income (you and your husband) will be less than $69,680, we can get the Ozempic for free from the drug company, which will help prevent you from hitting the Medicare Coverage Gap.  Feel free to call me with any questions or concerns. I look forward to our next visit!  Catie Darnelle Maffucci, PharmD, [place], CPP Direct line: [facility] phone: [phone]    Visit Information Patient Care Plan: Medication Management    Problem Identified: Diabetes, Hypertension     Long-Range Goal: Disease Progression Prevention   Start Date: 07/13/2020  Recent Progress: On track  Priority: High  Note:   Current Barriers:  . Concerns regarding her ability to independently afford treatment regimen . Unable to achieve control of diabetes  . Complex medical history including hx hepatitis B, history of intracranial hemorrhage   Pharmacist Clinical Goal(s):  Marland Kitchen Over the next 90 days, patient will verbalize ability to afford treatment regimen through collaboration with PharmD and provider. . Over the next 90 days, patient will achieve control of diabetes as evidenced by improvement in A1c through collaboration with PharmD and provider  Interventions: . Inter-disciplinary care team collaboration (see longitudinal plan of care) . Comprehensive medication review performed; medication list updated in electronic medical record  Diabetes: . Uncontrolled but improved; current treatment: metformin XR 500 mg QAM, Ozempic 0.5 mg weekly (x2  weeks). Endorses some increased bloating and cramping. Notes that she used to take Miralax daily while in [place], so has restarted doing this a few days ago. Also taking docusate o Hx glimepiride (d/c d/t hypoglycemia)  . Current glucose readings: fasting glucose: 150-180s, post prandial glucose: not checking as frequently . Denies hypoglycemic symptoms . Confirms decreased appetite and increased sensation of fullness. . Antiplatelet therapy: aspirin 81 mg daily . Recommended to continue current regimen, as full benefit of Ozempic dose likely not achieved yet . Discussed that GI symptoms may improve with continued dosing. Praised for inclusion of Miralax if this was previously recommended to her. Also discussed increasing fiber and hydration. Suggested Gas-X for treatment of bloating if gas related.   Hypertension: . Controlled; current treatment: ramipril 20 mg daily (2 10 mg tabs daily) o Previously on HCTZ, d/c d/t hyponatremia . Recommended to continue current regimen  Hyperlipidemia: . Controlled; current treatment: atorvastatin 20 mg daily  . Recommended to continue current regimen at this time.  Will discuss increasing statin intensity moving forward.   GERD: . Controlled on omeprazole 40 mg daily.  . Recommend to continue current regimen  Patient Goals/Self-Care Activities . Over the next 90 days, patient will:  - take medications as prescribed check blood glucose BID, document, and provide at future appointments  Follow Up Plan: Telephone follow up appointment with care management team member scheduled for: ~8 weeks      The patient verbalized understanding of instructions, educational materials, and care plan provided today and agreed to receive a mailed copy of patient instructions, educational materials, and care plan.    Plan: Telephone  follow up appointment with care management team member scheduled for:~ 8 weeks  Catie Darnelle Maffucci, PharmD, [place], [place]  Pharmacist [place] [phone]

## 2020-09-23 ENCOUNTER — Telehealth: Payer: Self-pay | Admitting: Nurse Practitioner

## 2020-09-23 MED ORDER — METFORMIN HCL ER 500 MG PO TB24: ORAL_TABLET | ORAL | 1 refills | Status: DC

## 2020-09-23 MED ORDER — OZEMPIC (0.25 OR 0.5 MG/DOSE) 2 MG/1.5ML ~~LOC~~ SOPN
PEN_INJECTOR | SUBCUTANEOUS | 1 refills | Status: DC
Start: 2020-09-23 — End: 2020-11-29

## 2020-09-23 NOTE — Telephone Encounter (Signed)
Patient called in need refill for OZEMPIC, 0.25 OR 0.5 MG/DOSE, 2 MG/1.5ML SOPN, metFORMIN (GLUCOPHAGE XR) 500 MG 24 hr tablet

## 2020-09-26 ENCOUNTER — Ambulatory Visit: Payer: Medicare Other | Admitting: Family Medicine

## 2020-09-27 ENCOUNTER — Telehealth: Payer: Self-pay

## 2020-09-27 NOTE — Chronic Care Management (AMB) (Signed)
°  Care Management   Note  09/27/2020 Name: Tiffany Gregory MRN: [id number] DOB: 04-29-1951  Tiffany Gregory is a 69 y.o. year old female who is a primary care patient of Marval Regal, NP and is actively engaged with the care management team. I reached out to [name] by phone today to assist with canceling  a follow up visit with the Pharmacist  Follow up plan: Unsuccessful telephone outreach attempt made. A HIPAA compliant phone message was left for the patient providing contact information and requesting a return call.  The care management team will reach out to the patient again over the next 5 days.  If patient returns call to provider office, please advise to call [name]  at [place], [address], [place], [place] [address] Direct Dial: [phone] [email or web address]: [email or web address]

## 2020-10-03 NOTE — Chronic Care Management (AMB) (Signed)
  Care Management   Note  10/03/2020 Name: Chelsei Mcchesney MRN: [id number] DOB: 1951/05/03  Tiffany Gregory is a 69 y.o. year old female who is a primary care patient of Theadore Nan, NP and is actively engaged with the care management team. I reached out to Corinna Lines by phone today to assist with canceling a follow up visit with the Pharmacist  Follow up plan: Patient has changed PCP and will cancel further follow up and engagement by the care management team. Appropriate care team members and provider have been notified via electronic communication  Penne Lash, RMA Care Guide, Embedded Care Coordination [facility]  [place], [place] [address] Direct Dial: [phone] [email or web address]

## 2020-10-03 NOTE — Chronic Care Management (AMB) (Signed)
  Care Management   Note  10/03/2020 Name: Tiffany Gregory MRN: [id number] DOB: 27-May-1951  Tiffany Gregory is a 69 y.o. year old female who is a primary care patient of Marval Regal, NP and is actively engaged with the care management team. I reached out to [name] by phone today to assist with canceling  a follow up visit with the Pharmacist due to changing PCP.  Follow up plan: Unsuccessful telephone outreach attempt made. A HIPAA compliant phone message was left for the patient providing contact information and requesting a return call.  The care management team will reach out to the patient again over the next 7 days.  If patient returns call to provider office, please advise to call [facility]  at [place], [place], [place], [place] [address] Direct Dial: [phone] [email or web address]: [email or web address]

## 2020-10-11 ENCOUNTER — Other Ambulatory Visit: Payer: Self-pay

## 2020-10-11 ENCOUNTER — Ambulatory Visit: Payer: Medicare Other | Admitting: Gastroenterology

## 2020-10-11 ENCOUNTER — Encounter: Payer: Self-pay | Admitting: Gastroenterology

## 2020-10-11 VITALS — BP 145/76 | HR 90 | Temp 97.2°F | Ht 65.0 in | Wt 162.6 lb

## 2020-10-11 DIAGNOSIS — B181 Chronic viral hepatitis B without delta-agent: Secondary | ICD-10-CM

## 2020-10-11 DIAGNOSIS — R748 Abnormal levels of other serum enzymes: Secondary | ICD-10-CM | POA: Diagnosis not present

## 2020-10-11 DIAGNOSIS — K219 Gastro-esophageal reflux disease without esophagitis: Secondary | ICD-10-CM

## 2020-10-11 NOTE — Progress Notes (Signed)
Tiffany Gregory [address]  [place]  [place], [place] [address]  Main: [phone]  Fax: [phone]   Gastroenterology Consultation  Referring Provider:     Marval Regal, NP Primary Care Physician:  Tiffany Beam, FNP Reason for Consultation:             HPI:    Chief Complaint  Patient presents with  . Elevated Hepatic Enzymes    Tiffany Gregory is a 70 y.o. y/o female referred for consultation & management  by Dr. Claudina Lick, Tiffany Aline, FNP.  Patient referred for elevated liver enzymes.  Patient reports previous history of the same and states she was told she has fatty liver and also has been told when she was 70 years old that she has history of hepatitis B but has never needed treatment.  Most recent blood work by PCP shows positive hepatitis B core antibody and hepatitis B surface antibody level being less than 3.1 MIU per mL.  Negative hepatitis B surface antigen.  The patient denies abdominal or flank pain, anorexia, nausea or vomiting, recent dysphagia, change in bowel habits or black or bloody stools or weight loss.  Patient reports in 2003 at 2004 she had an episode of food impaction and underwent upper endoscopy with removal of food bolus.  She was told she has a tortuous esophagus and needs to be on omeprazole chronically.  Since then she has been on omeprazole with no further dysphagia or heartburn.  No other upper endoscopy since then  Has also had 3 colonoscopies in her lifetime and no polyps were found on any one of them, last one being in 2012.  No family history of colon cancer.  Past Medical History:  Diagnosis Date  . Allergy   . Asthma   . Diabetes mellitus without complication ([place])   . Elevated liver enzymes   . Hepatic steatosis   . History of hepatitis B 1971   She had IV drug use experimentation and was hosptialized and reports treated.   . Hyperlipidemia   . Hypertension     Past Surgical History:  Procedure Laterality  Date  . ABDOMINAL HYSTERECTOMY    . BREAST BIOPSY    . CRANIECTOMY    . NASAL FRACTURE SURGERY  1975  . NASAL SINUS SURGERY  2010    Prior to Admission medications   Medication Sig Start Date End Date Taking? Authorizing Provider  amoxicillin-clavulanate (AUGMENTIN) 500-125 MG tablet Take 1 tablet by mouth 2 (two) times daily. 10/10/20  Yes [provider]  aspirin EC 81 MG tablet Take 81 mg by mouth daily. Swallow whole.   Yes [provider]  atorvastatin (LIPITOR) 20 MG tablet Take 1 tablet (20 mg total) by mouth daily. 07/22/20  Yes Tiffany Regal, NP  Blood Glucose Monitoring Suppl (ONE TOUCH ULTRA 2) w/Device KIT by Does not apply route.   Yes [provider]  glucose blood test strip 1 each by Other route as needed for other. Use as instructed. One touch ultra blue in vitro strips 08/15/20  Yes Tiffany Regal, NP  metFORMIN (GLUCOPHAGE XR) 500 MG 24 hr tablet Take 1000 mg twice daily with meals. 09/23/20  Yes Tiffany Haven, MD  omeprazole (PRILOSEC) 20 MG capsule Take 1 capsule (20 mg total) by mouth daily. 07/22/20  Yes Tiffany Regal, NP  OneTouch Delica Lancets 53Z MISC by Does not apply route.   Yes [provider]  polyethylene glycol (MIRALAX /  GLYCOLAX) 17 g packet Take 17 g by mouth daily.   Yes [provider]  ramipril (ALTACE) 10 MG capsule Take 1 capsule (10 mg total) by mouth 2 (two) times daily. 07/22/20  Yes Denice Paradise A, NP  Semaglutide,0.25 or 0.5MG/DOS, (OZEMPIC, 0.25 OR 0.5 MG/DOSE,) 2 MG/1.5ML SOPN INJECT 0.25 MG INTO THE SKIN ONCE A WEEK FOR 4 WEEKS, THEN INCREASE TO 0.5MG WEEKLY 09/23/20  Yes Tiffany Haven, MD    Family History  Problem Relation Age of Onset  . Diabetes Mother   . Heart attack Mother   . Diabetes Father   . Heart disease Father   . Hypertension Father   . Diabetes Sister   . COPD Sister   . Heart disease Sister   . Diabetes Brother   . Heart attack Maternal Grandmother    . Cancer Maternal Grandfather   . Heart attack Paternal Grandmother   . Cancer Paternal Grandfather   . Heart attack Brother   . Heart disease Brother      Social History   Tobacco Use  . Smoking status: Never Smoker  . Smokeless tobacco: Never Used  Vaping Use  . Vaping Use: Never used  Substance Use Topics  . Alcohol use: Not Currently  . Drug use: Never    Allergies as of 10/11/2020 - Review Complete 10/11/2020  Allergen Reaction Noted  . Sulfa antibiotics  06/14/2020    Review of Systems:    All systems reviewed and negative except where noted in HPI.   Physical Exam:  BP (!) 145/76   Pulse 90   Temp (!) 97.2 F (36.2 C) (Oral)   Ht _0  (1.651 m)   Wt 162 lb 9.6 oz (73.8 kg)   BMI 27.06 kg/m  No LMP recorded. Psych:  Alert and cooperative. Normal mood and affect. General:   Alert,  Well-developed, well-nourished, pleasant and cooperative in NAD Head:  Normocephalic and atraumatic. Eyes:  Sclera clear, no icterus.   Conjunctiva pink. Ears:  Normal auditory acuity. Nose:  No deformity, discharge, or lesions. Mouth:  No deformity or lesions,oropharynx pink & moist. Neck:  Supple; no masses or thyromegaly. Abdomen:  Normal bowel sounds.  No bruits.  Soft, non-tender and non-distended without masses, hepatosplenomegaly or hernias noted.  No guarding or rebound tenderness.    Msk:  Symmetrical without gross deformities. Good, equal movement & strength bilaterally. Pulses:  Normal pulses noted. Extremities:  No clubbing or edema.  No cyanosis. Neurologic:  Alert and oriented x3;  grossly normal neurologically. Skin:  Intact without significant lesions or rashes. No jaundice. Lymph Nodes:  No significant cervical adenopathy. Psych:  Alert and cooperative. Normal mood and affect.   Labs: CBC    Component Value Date/Time   WBC 6.8 06/14/2020 1117   RBC 4.53 06/14/2020 1117   HGB 12.8 06/14/2020 1117   HCT 37.6 06/14/2020 1117   PLT 277.0 06/14/2020 1117    MCV 83.0 06/14/2020 1117   MCHC 33.9 06/14/2020 1117   RDW 15.1 06/14/2020 1117   LYMPHSABS 1.7 06/14/2020 1117   MONOABS 0.3 06/14/2020 1117   EOSABS 0.2 06/14/2020 1117   BASOSABS 0.1 06/14/2020 1117   CMP     Component Value Date/Time   NA 137 08/12/2020 0851   K 4.2 08/12/2020 0851   CL 99 08/12/2020 0851   CO2 28 08/12/2020 0851   GLUCOSE 147 (H) 08/12/2020 0851   BUN 8 08/12/2020 0851   CREATININE 0.79 08/12/2020 0851   CALCIUM  9.5 08/12/2020 0851   PROT 7.8 06/14/2020 1117   ALBUMIN 4.3 06/14/2020 1117   AST 49 (H) 06/14/2020 1117   ALT 50 (H) 06/14/2020 1117   ALKPHOS 53 06/14/2020 1117   BILITOT 0.3 06/14/2020 1117   GFRNONAA >60 06/20/2020 1054   GFRAA >60 06/20/2020 1054    Imaging Studies: No results found.  Assessment and Plan:   Starlit Raburn is a 70 y.o. y/o female has been referred for elevated liver enzymes  Transaminases elevated Previous-records-personally reviewed and even dating back to 2018 patient has had mild elevation in transaminases  No evidence of liver dysfunction  Recent ultrasound suggest possible hepatic steatosis  We will repeat hepatitis B labs at this time and rule out any other causes of elevated liver enzymes as well  We will obtain previous EGD and colonoscopy records  We discussed scheduling for screening colonoscopy and given her chronic omeprazole use and GERD EGD as well.  Patient would like to hold off on scheduling these procedures as she has family stuff to deal with and states she will call us when she is ready to schedule  Acid reflux lifestyle modifications discussed  Dr Tiffany Gregory  Speech recognition software was used to dictate the above note.

## 2020-10-12 ENCOUNTER — Telehealth: Payer: Self-pay

## 2020-10-13 LAB — ANTI-MICROSOMAL ANTIBODY LIVER / KIDNEY: LKM1 Ab: 0.7 Units (ref 0.0–20.0)

## 2020-10-13 LAB — HEPATIC FUNCTION PANEL
ALT: 33 IU/L — ABNORMAL HIGH (ref 0–32)
AST: 28 IU/L (ref 0–40)
Albumin: 4.5 g/dL (ref 3.8–4.8)
Alkaline Phosphatase: 80 IU/L (ref 44–121)
Bilirubin Total: 0.4 mg/dL (ref 0.0–1.2)
Bilirubin, Direct: 0.13 mg/dL (ref 0.00–0.40)
Total Protein: 7.7 g/dL (ref 6.0–8.5)

## 2020-10-13 LAB — MITOCHONDRIAL/SMOOTH MUSCLE AB PNL
Mitochondrial Ab: <20 Units (ref 0.0–20.0)
Smooth Muscle Ab: 10 Units (ref 0–19)

## 2020-10-13 LAB — HEPATITIS B CORE ANTIBODY, TOTAL: Hep B Core Total Ab: POSITIVE — AB

## 2020-10-13 LAB — ANA: ANA Titer 1: NEGATIVE

## 2020-10-13 LAB — HEPATITIS B DNA, ULTRAQUANTITATIVE, PCR: HBV DNA SERPL PCR-ACNC: NOT DETECTED IU/mL

## 2020-10-13 LAB — HEPATITIS B SURFACE ANTIBODY,QUALITATIVE: Hep B Surface Ab, Qual: NONREACTIVE

## 2020-10-13 LAB — CERULOPLASMIN: Ceruloplasmin: 28.5 mg/dL (ref 19.0–39.0)

## 2020-10-13 LAB — IGG: IgG (Immunoglobin G), Serum: 1217 mg/dL (ref 586–1602)

## 2020-10-13 LAB — HEPATITIS B SURFACE ANTIGEN: Hepatitis B Surface Ag: NEGATIVE

## 2020-10-17 NOTE — Telephone Encounter (Signed)
Medical record was requested.

## 2020-10-19 ENCOUNTER — Emergency Department: Payer: Medicare Other

## 2020-10-19 ENCOUNTER — Emergency Department
Admission: EM | Admit: 2020-10-19 | Discharge: 2020-10-19 | Disposition: A | Discharge status: Home / Self Care | Payer: Medicare Other | Attending: Emergency Medicine | Admitting: Emergency Medicine

## 2020-10-19 ENCOUNTER — Encounter: Payer: Self-pay | Admitting: Emergency Medicine

## 2020-10-19 ENCOUNTER — Other Ambulatory Visit: Payer: Self-pay

## 2020-10-19 DIAGNOSIS — J45909 Unspecified asthma, uncomplicated: Secondary | ICD-10-CM | POA: Insufficient documentation

## 2020-10-19 DIAGNOSIS — E119 Type 2 diabetes mellitus without complications: Secondary | ICD-10-CM | POA: Diagnosis not present

## 2020-10-19 DIAGNOSIS — W01198A Fall on same level from slipping, tripping and stumbling with subsequent striking against other object, initial encounter: Secondary | ICD-10-CM | POA: Insufficient documentation

## 2020-10-19 DIAGNOSIS — S0990XA Unspecified injury of head, initial encounter: Secondary | ICD-10-CM

## 2020-10-19 DIAGNOSIS — I10 Essential (primary) hypertension: Secondary | ICD-10-CM | POA: Insufficient documentation

## 2020-10-19 DIAGNOSIS — Z7982 Long term (current) use of aspirin: Secondary | ICD-10-CM | POA: Diagnosis not present

## 2020-10-19 DIAGNOSIS — J321 Chronic frontal sinusitis: Secondary | ICD-10-CM | POA: Diagnosis not present

## 2020-10-19 DIAGNOSIS — W19XXXA Unspecified fall, initial encounter: Secondary | ICD-10-CM

## 2020-10-19 DIAGNOSIS — Z7984 Long term (current) use of oral hypoglycemic drugs: Secondary | ICD-10-CM | POA: Diagnosis not present

## 2020-10-19 DIAGNOSIS — S0181XA Laceration without foreign body of other part of head, initial encounter: Secondary | ICD-10-CM | POA: Diagnosis not present

## 2020-10-19 DIAGNOSIS — M47812 Spondylosis without myelopathy or radiculopathy, cervical region: Secondary | ICD-10-CM | POA: Diagnosis not present

## 2020-10-19 DIAGNOSIS — J0120 Acute ethmoidal sinusitis, unspecified: Secondary | ICD-10-CM | POA: Diagnosis not present

## 2020-10-19 DIAGNOSIS — G9389 Other specified disorders of brain: Secondary | ICD-10-CM | POA: Diagnosis not present

## 2020-10-19 DIAGNOSIS — S0993XA Unspecified injury of face, initial encounter: Secondary | ICD-10-CM | POA: Diagnosis present

## 2020-10-19 DIAGNOSIS — S199XXA Unspecified injury of neck, initial encounter: Secondary | ICD-10-CM | POA: Diagnosis not present

## 2020-10-19 NOTE — ED Triage Notes (Signed)
Pt comes into the ED via POV c/o mechanical fall where she hit the front of her head.  Pt has laceration present as well.  Pt in NAd at this time and is alert and oriented x4.  Pt denies any LOC or blood thinner use.

## 2020-10-19 NOTE — Discharge Instructions (Addendum)
Follow-up with your primary care provider if any continued problems or concerns.  Watch the laceration to your forehead and clean daily with mild soap and water.  Watch for any signs of infection.  Continue your regular medications as prescribed by your doctor.  Tomorrow you may have places that are sore and stiff from your fall today which is not unusual.  Return to the emergency department if any severe worsening or urgent concerns.

## 2020-10-19 NOTE — ED Provider Notes (Signed)
[facility] Emergency Department Provider Note  ____________________________________________   None    (approximate)  I have reviewed the triage vital signs and the nursing notes.   HISTORY  Chief Complaint Fall and Laceration   HPI Tiffany Gregory is a 70 y.o. female presents to the ED after a mechanical fall this morning.  Patient states that she tripped over a thin rope that was being used as a support for tree.  Patient reports that she did hit the ground and has a small laceration to her forehead.  She denies any visual changes, nausea or vomiting but in February 2021 had a head injury that resulted in surgery and is anxious about any further trauma.  She denies any nausea, vomiting or headache at this time.  Her tetanus has been within the last 5 years.  Currently she is on aspirin but no other blood thinner.  Patient has remained ambulatory since her accident and denies any other injury other than being sore.  Rates her pain as 6 out of 10.       Past Medical History:  Diagnosis Date  . Allergy   . Asthma   . Diabetes mellitus without complication ([place])   . Elevated liver enzymes   . Hepatic steatosis   . History of hepatitis B 1971   She had IV drug use experimentation and was hosptialized and reports treated.   . Hyperlipidemia   . Hypertension     Patient Active Problem List   Diagnosis Date Noted  . Hyperlipidemia 06/22/2020  . Elevated liver enzymes 06/22/2020  . History of hepatitis B virus infection 06/22/2020  . Overweight with body mass index (BMI) of 27 to 27.9 in adult 06/22/2020  . Hepatic steatosis 06/15/2020  . Encounter for medical examination to establish care 06/14/2020  . Type 2 diabetes mellitus without complication, without long-term current use of insulin ([place]) 06/14/2020  . Essential hypertension 06/14/2020  . Skin lesion 06/14/2020    Past Surgical History:  Procedure Laterality Date  . ABDOMINAL HYSTERECTOMY     . BREAST BIOPSY    . CRANIECTOMY    . NASAL FRACTURE SURGERY  1975  . NASAL SINUS SURGERY  2010    Prior to Admission medications   Medication Sig Start Date End Date Taking? Authorizing Provider  aspirin EC 81 MG tablet Take 81 mg by mouth daily. Swallow whole.    [provider]  atorvastatin (LIPITOR) 20 MG tablet Take 1 tablet (20 mg total) by mouth daily. 07/22/20   Marval Regal, NP  Blood Glucose Monitoring Suppl (ONE TOUCH ULTRA 2) w/Device KIT by Does not apply route.    [provider]  glucose blood test strip 1 each by Other route as needed for other. Use as instructed. One touch ultra blue in vitro strips 08/15/20   Marval Regal, NP  metFORMIN (GLUCOPHAGE XR) 500 MG 24 hr tablet Take 1000 mg twice daily with meals. 09/23/20   Leone Haven, MD  omeprazole (PRILOSEC) 20 MG capsule Take 1 capsule (20 mg total) by mouth daily. 07/22/20   Marval Regal, NP  OneTouch Delica Lancets 93J MISC by Does not apply route.    [provider]  polyethylene glycol (MIRALAX / GLYCOLAX) 17 g packet Take 17 g by mouth daily.    [provider]  ramipril (ALTACE) 10 MG capsule Take 1 capsule (10 mg total) by mouth 2 (two) times daily. 07/22/20   Marval Regal, NP  Semaglutide,0.25 or 0.5MG/DOS, (OZEMPIC, 0.25 OR 0.5 MG/DOSE,) 2 MG/1.5ML SOPN INJECT 0.25 MG INTO THE SKIN ONCE A WEEK FOR 4 WEEKS, THEN INCREASE TO 0.5MG WEEKLY 09/23/20   Leone Haven, MD    Allergies Sulfa antibiotics  Family History  Problem Relation Age of Onset  . Diabetes Mother   . Heart attack Mother   . Diabetes Father   . Heart disease Father   . Hypertension Father   . Diabetes Sister   . COPD Sister   . Heart disease Sister   . Diabetes Brother   . Heart attack Maternal Grandmother   . Cancer Maternal Grandfather   . Heart attack Paternal Grandmother   . Cancer Paternal Grandfather   . Heart attack Brother   . Heart disease Brother     Social  History Social History   Tobacco Use  . Smoking status: Never Smoker  . Smokeless tobacco: Never Used  Vaping Use  . Vaping Use: Never used  Substance Use Topics  . Alcohol use: Not Currently  . Drug use: Never    Review of Systems Constitutional: No fever/chills Eyes: No visual changes. ENT: Negative for injury. Cardiovascular: Denies chest pain. Respiratory: Denies shortness of breath. Gastrointestinal: No abdominal pain.  No nausea, no vomiting.   Genitourinary: Negative for dysuria. Musculoskeletal: Negative for extremity pain. Skin: Positive for laceration forehead. Neurological: Negative for headaches, focal weakness or numbness. ____________________________________________   PHYSICAL EXAM:  VITAL SIGNS: ED Triage Vitals  Enc Vitals Group     BP 10/19/20 1217 (!) 143/73     Pulse Rate 10/19/20 1217 87     Resp 10/19/20 1217 17     Temp 10/19/20 1217 98.5 F (36.9 C)     Temp Source 10/19/20 1217 Oral     SpO2 10/19/20 1217 99 %     Weight 10/19/20 1212 159 lb (72.1 kg)     Height 10/19/20 1212 5' 5" (1.651 m)     Head Circumference --      Peak Flow --      Pain Score 10/19/20 1212 6     Pain Loc --      Pain Edu? --      Excl. in [place]? --     Constitutional: Alert and oriented. Well appearing and in no acute distress. Eyes: Conjunctivae are normal. PERRL. EOMI. Head: Atraumatic. Nose: No trauma. Mouth/Throat: No trauma. Neck: No stridor.   No tenderness or skin discoloration noted on cervical spine.  No point tenderness on palpation posteriorly. Cardiovascular: Normal rate, regular rhythm. Grossly normal heart sounds.  Good peripheral circulation. Respiratory: Normal respiratory effort.  No retractions. Lungs CTAB. Gastrointestinal: Soft and nontender. No distention.  Bowel sounds normoactive x4 quadrants. Musculoskeletal: She is able to move upper and lower extremities without any difficulty.  There is no tenderness on palpation of the thoracic or  lumbar spine.  No effusion is noted bilateral knees.  Patient is ambulatory without any assistance. Neurologic:  Normal speech and language. No gross focal neurologic deficits are appreciated. No gait instability. Skin:  Skin is warm, dry.  There is a very superficial skin avulsion on the right forehead without active bleeding.  No foreign bodies are noted. Psychiatric: Mood and affect are normal. Speech and behavior are normal.  ____________________________________________   LABS (all labs ordered are listed, but only abnormal results are displayed)  Labs Reviewed - No data to display   RADIOLOGY I, Johnn Hai, personally viewed and evaluated these images (  plain radiographs) as part of my medical decision making, as well as reviewing the written report by the radiologist.   Official radiology report(s): CT Head Wo Contrast  Result Date: 10/19/2020 CLINICAL DATA:  Status post fall with a blow to the head. Initial encounter. EXAM: CT HEAD WITHOUT CONTRAST CT CERVICAL SPINE WITHOUT CONTRAST TECHNIQUE: Multidetector CT imaging of the head and cervical spine was performed following the standard protocol without intravenous contrast. Multiplanar CT image reconstructions of the cervical spine were also generated. COMPARISON:  None. FINDINGS: CT HEAD FINDINGS Brain: No evidence of acute infarction, hemorrhage, hydrocephalus, extra-axial collection or mass lesion/mass effect. There is dural thickening on the right subjacent to a craniotomy defect. Vascular: No hyperdense vessel or unexpected calcification. Skull: Normal. Negative for fracture or focal lesion. Right frontal craniotomy defect noted. Sinuses/Orbits: There is near complete opacification of the right frontal sinus. Mild ethmoid air cell disease noted. Other: None. CT CERVICAL SPINE FINDINGS Alignment: Maintained.  Straightening of lordosis noted. Skull base and vertebrae: No acute fracture. No primary bone lesion or focal pathologic  process. Soft tissues and spinal canal: No prevertebral fluid or swelling. No visible canal hematoma. Disc levels: There is scattered facet degenerative disease. Loss of disc space height and endplate spurring are seen at C6-7. Upper chest: Negative. Other: There appears to be a soft tissue contusion or laceration above left eye. IMPRESSION: Soft tissue contusion or laceration above left eye. No acute other abnormality head or cervical spine. Status post right frontal craniotomy. Cervical spondylosis. Electronically Signed   By: Inge Rise M.D.   On: 10/19/2020 13:58   CT Cervical Spine Wo Contrast  Result Date: 10/19/2020 CLINICAL DATA:  Status post fall with a blow to the head. Initial encounter. EXAM: CT HEAD WITHOUT CONTRAST CT CERVICAL SPINE WITHOUT CONTRAST TECHNIQUE: Multidetector CT imaging of the head and cervical spine was performed following the standard protocol without intravenous contrast. Multiplanar CT image reconstructions of the cervical spine were also generated. COMPARISON:  None. FINDINGS: CT HEAD FINDINGS Brain: No evidence of acute infarction, hemorrhage, hydrocephalus, extra-axial collection or mass lesion/mass effect. There is dural thickening on the right subjacent to a craniotomy defect. Vascular: No hyperdense vessel or unexpected calcification. Skull: Normal. Negative for fracture or focal lesion. Right frontal craniotomy defect noted. Sinuses/Orbits: There is near complete opacification of the right frontal sinus. Mild ethmoid air cell disease noted. Other: None. CT CERVICAL SPINE FINDINGS Alignment: Maintained.  Straightening of lordosis noted. Skull base and vertebrae: No acute fracture. No primary bone lesion or focal pathologic process. Soft tissues and spinal canal: No prevertebral fluid or swelling. No visible canal hematoma. Disc levels: There is scattered facet degenerative disease. Loss of disc space height and endplate spurring are seen at C6-7. Upper chest:  Negative. Other: There appears to be a soft tissue contusion or laceration above left eye. IMPRESSION: Soft tissue contusion or laceration above left eye. No acute other abnormality head or cervical spine. Status post right frontal craniotomy. Cervical spondylosis. Electronically Signed   By: Inge Rise M.D.   On: 10/19/2020 13:58    ____________________________________________   PROCEDURES  Procedure(s) performed (including Critical Care):  Procedures   ____________________________________________   INITIAL IMPRESSION / ASSESSMENT AND PLAN / ED COURSE  As part of my medical decision making, I reviewed the following data within the electronic MEDICAL RECORD NUMBER Notes from prior ED visits and [place] Controlled Substance Database  70 year old female presents to the ED after a mechanical fall in which she tripped  over a rope that was supporting a tree.  She has superficial skin avulsion to her right forehead.  Patient is up-to-date with tetanus booster.  No active bleeding and no foreign body was noted to the area.  Physical exam was benign and patient is ambulatory without any assistance.  CT head does not show any acute changes from her prior surgery in February 2021.  Cervical spine was negative for any acute changes.  Patient was made aware.  She was made aware that she would have soreness and stiffness tomorrow.  She is to follow-up with her PCP if any continued problems.  If there is any severe worsening of her symptoms or urgent concerns she is to return to the emergency department. ____________________________________________   FINAL CLINICAL IMPRESSION(S) / ED DIAGNOSES  Final diagnoses:  Facial laceration, initial encounter  Injury of head, initial encounter  Fall, initial encounter     ED Discharge Orders    None      *Please note:  Caralynn Gelber was evaluated in Emergency Department on 10/19/2020 for the symptoms described in the history of present illness. She was  evaluated in the context of the global COVID-19 pandemic, which necessitated consideration that the patient might be at risk for infection with the SARS-CoV-2 virus that causes COVID-19. Institutional protocols and algorithms that pertain to the evaluation of patients at risk for COVID-19 are in a state of rapid change based on information released by regulatory bodies including the CDC and federal and state organizations. These policies and algorithms were followed during the patient's care in the ED.  Some ED evaluations and interventions may be delayed as a result of limited staffing during and the pandemic.*   Note:  This document was prepared using Dragon voice recognition software and may include unintentional dictation errors.    Johnn Hai, PA-C 10/19/20 1613    Lucrezia Starch, MD 10/19/20 [phone]

## 2020-10-24 ENCOUNTER — Ambulatory Visit: Payer: Medicare Other | Admitting: Nurse Practitioner

## 2020-10-27 ENCOUNTER — Ambulatory Visit (INDEPENDENT_AMBULATORY_CARE_PROVIDER_SITE_OTHER): Payer: Medicare Other | Admitting: Adult Health

## 2020-10-27 ENCOUNTER — Telehealth: Payer: Medicare Other

## 2020-10-27 ENCOUNTER — Encounter: Payer: Self-pay | Admitting: Adult Health

## 2020-10-27 ENCOUNTER — Other Ambulatory Visit: Payer: Self-pay

## 2020-10-27 VITALS — BP 144/72 | HR 78 | Temp 98.2°F | Resp 16 | Ht 65.0 in | Wt 162.4 lb

## 2020-10-27 DIAGNOSIS — K760 Fatty (change of) liver, not elsewhere classified: Secondary | ICD-10-CM | POA: Diagnosis not present

## 2020-10-27 DIAGNOSIS — E119 Type 2 diabetes mellitus without complications: Secondary | ICD-10-CM | POA: Diagnosis not present

## 2020-10-27 DIAGNOSIS — K219 Gastro-esophageal reflux disease without esophagitis: Secondary | ICD-10-CM | POA: Diagnosis not present

## 2020-10-27 DIAGNOSIS — Z8379 Family history of other diseases of the digestive system: Secondary | ICD-10-CM

## 2020-10-27 DIAGNOSIS — I10 Essential (primary) hypertension: Secondary | ICD-10-CM | POA: Diagnosis not present

## 2020-10-27 DIAGNOSIS — Z1389 Encounter for screening for other disorder: Secondary | ICD-10-CM

## 2020-10-27 DIAGNOSIS — R413 Other amnesia: Secondary | ICD-10-CM | POA: Diagnosis not present

## 2020-10-27 DIAGNOSIS — Z9889 Other specified postprocedural states: Secondary | ICD-10-CM | POA: Diagnosis not present

## 2020-10-27 DIAGNOSIS — E663 Overweight: Secondary | ICD-10-CM

## 2020-10-27 DIAGNOSIS — Z8619 Personal history of other infectious and parasitic diseases: Secondary | ICD-10-CM

## 2020-10-27 DIAGNOSIS — R748 Abnormal levels of other serum enzymes: Secondary | ICD-10-CM

## 2020-10-27 DIAGNOSIS — E785 Hyperlipidemia, unspecified: Secondary | ICD-10-CM

## 2020-10-27 DIAGNOSIS — Z6827 Body mass index (BMI) 27.0-27.9, adult: Secondary | ICD-10-CM

## 2020-10-27 MED ORDER — METFORMIN HCL ER 500 MG PO TB24: ORAL_TABLET | ORAL | 0 refills | Status: DC

## 2020-10-27 NOTE — Progress Notes (Signed)
New patient visit   Patient: Tiffany Gregory   DOB: 11-Jan-1951   70 y.o. Female  MRN: [id number] Visit Date: 10/27/2020  Today's healthcare provider: Marcille Buffy, FNP   Chief Complaint  Patient presents with  . New Patient (Initial Visit)   Subjective    Tiffany Gregory is a 70 y.o. female who presents today as a new patient to establish care.  HPI  Patient reports that she feels fairly well but has concerns to address today. Patient reports that a week ago she had fell on ice and hit her head, she states that she was seen in ED and reports today that she has concerns of memory loss/gap- which have been present since February 2021. .She did not loose consciousness. She reports her words leave her at times.   Patient reports that it is hard for her to remember and states that she feels that her attention span has been shorter than normal, patient reports that she had a previous head injury years prior but states that memory loss has become a recent issue.  Patient reports that when she fell  a week ago she landed on her right side and hit head but reports that she has been having pain and soreness on the left side of the body and reports skin sensitivity on left side. She has remained ambulatory since incident.   She also was treated for laceration and tetanus is current within the last 5 years per ED note 10/19/2020 Denies any visual changes or nausea vomiting.    Patient reports that eating habits are poor and states that she eats 1-2 meals a day, she is not actively exercising and states that sleep patterns are poor because she wakes up often throughout the night.   She reports in [place], she was in hospital November 18 2019, she has had a fall on ice and resulted in admission to the hospital - I do not have those records. She reports she was hospitalized for 21 days was in ICU the entire time, she has craniotomy on February 21st and on the 23rd noticed it was not draining  as it should and place a larger shunt.  She was on antiseizure medications and was told to stop after follow up with neurosurgeon. She has had no seizures.   Hepatits B antibody positive had active at age 43 saw GI last week wants her to have colonoscopy and endoscopy. She is on omeprazole for GERD.   Patient  denies any fever,chills, rash, chest pain, shortness of breath, nausea, vomiting, or diarrhea.   Denies dizziness, lightheadedness, pre syncopal or syncopal episodes.   .Reviewed radiology reports.  Official radiology report(s): CT Head Wo Contrast  Result Date: 10/19/2020 CLINICAL DATA:  Status post fall with a blow to the head. Initial encounter. EXAM: CT HEAD WITHOUT CONTRAST CT CERVICAL SPINE WITHOUT CONTRAST TECHNIQUE: Multidetector CT imaging of the head and cervical spine was performed following the standard protocol without intravenous contrast. Multiplanar CT image reconstructions of the cervical spine were also generated. COMPARISON:  None. FINDINGS: CT HEAD FINDINGS Brain: No evidence of acute infarction, hemorrhage, hydrocephalus, extra-axial collection or mass lesion/mass effect. There is dural thickening on the right subjacent to a craniotomy defect. Vascular: No hyperdense vessel or unexpected calcification. Skull: Normal. Negative for fracture or focal lesion. Right frontal craniotomy defect noted. Sinuses/Orbits: There is near complete opacification of the right frontal sinus. Mild ethmoid air cell disease noted. Other: None. CT CERVICAL SPINE FINDINGS Alignment: Maintained.  Straightening of lordosis noted. Skull base and vertebrae: No acute fracture. No primary bone lesion or focal pathologic process. Soft tissues and spinal canal: No prevertebral fluid or swelling. No visible canal hematoma. Disc levels: There is scattered facet degenerative disease. Loss of disc space height and endplate spurring are seen at C6-7. Upper chest: Negative. Other: There appears to be a soft  tissue contusion or laceration above left eye. IMPRESSION: Soft tissue contusion or laceration above left eye. No acute other abnormality head or cervical spine. Status post right frontal craniotomy. Cervical spondylosis. Electronically Signed   By: Inge Rise M.D.   On: 10/19/2020 13:58   CT Cervical Spine Wo Contrast  Result Date: 10/19/2020 CLINICAL DATA:  Status post fall with a blow to the head. Initial encounter. EXAM: CT HEAD WITHOUT CONTRAST CT CERVICAL SPINE WITHOUT CONTRAST TECHNIQUE: Multidetector CT imaging of the head and cervical spine was performed following the standard protocol without intravenous contrast. Multiplanar CT image reconstructions of the cervical spine were also generated. COMPARISON:  None. FINDINGS: CT HEAD FINDINGS Brain: No evidence of acute infarction, hemorrhage, hydrocephalus, extra-axial collection or mass lesion/mass effect. There is dural thickening on the right subjacent to a craniotomy defect. Vascular: No hyperdense vessel or unexpected calcification. Skull: Normal. Negative for fracture or focal lesion. Right frontal craniotomy defect noted. Sinuses/Orbits: There is near complete opacification of the right frontal sinus. Mild ethmoid air cell disease noted. Other: None. CT CERVICAL SPINE FINDINGS Alignment: Maintained.  Straightening of lordosis noted. Skull base and vertebrae: No acute fracture. No primary bone lesion or focal pathologic process. Soft tissues and spinal canal: No prevertebral fluid or swelling. No visible canal hematoma. Disc levels: There is scattered facet degenerative disease. Loss of disc space height and endplate spurring are seen at C6-7. Upper chest: Negative. Other: There appears to be a soft tissue contusion or laceration above left eye. IMPRESSION: Soft tissue contusion or laceration above left eye. No acute other abnormality head or cervical spine. Status post right frontal craniotomy. Cervical spondylosis. Electronically Signed    By: Inge Rise M.D.   On: 10/19/2020 13:58    1/12/20220IMPRESSION: Soft tissue contusion or laceration above left eye. No acute other abnormality head or cervical spine.  Status post right frontal craniotomy.  Cervical spondylosis.   Electronically Signed   By: Inge Rise M.D.   On: 10/19/2020 13:58    Past Medical History:  Diagnosis Date  . Allergy   . Asthma   . Diabetes mellitus without complication ([place])   . Elevated liver enzymes   . Hepatic steatosis   . History of hepatitis B 1971   She had IV drug use experimentation and was hosptialized and reports treated.   . Hyperlipidemia   . Hypertension    Past Surgical History:  Procedure Laterality Date  . ABDOMINAL HYSTERECTOMY    . BREAST BIOPSY    . CRANIECTOMY    . NASAL FRACTURE SURGERY  1975  . NASAL SINUS SURGERY  2010   Family Status  Relation Name Status  . Mother  Deceased  . Father  Deceased  . Sister Charolette Deceased  . Brother ONEOK  . Daughter  Alive  . Son  Alive  . MGM  Deceased  . MGF  Deceased  . PGM  Deceased  . PGF  Deceased  . Brother [employer]   Family History  Problem Relation Age of Onset  . Diabetes Mother   . Heart attack Mother   . Diabetes  Father   . Heart disease Father   . Hypertension Father   . Diabetes Sister   . COPD Sister   . Heart disease Sister   . Diabetes Brother   . Heart attack Maternal Grandmother   . Cancer Maternal Grandfather   . Heart attack Paternal Grandmother   . Cancer Paternal Grandfather   . Heart attack Brother   . Heart disease Brother    Social History   Socioeconomic History  . Marital status: Married    Spouse name: Not on file  . Number of children: Not on file  . Years of education: Not on file  . Highest education level: Bachelor's degree (e.g., BA, AB, BS)  Occupational History  . Not on file  Tobacco Use  . Smoking status: Never Smoker  . Smokeless tobacco: Never Used  Vaping Use  .  Vaping Use: Never used  Substance and Sexual Activity  . Alcohol use: Not Currently  . Drug use: Never  . Sexual activity: Not Currently    Partners: Male  Other Topics Concern  . Not on file  Social History Narrative  . Not on file   Social Determinants of Health   Financial Resource Strain: Low Risk   . Difficulty of Paying Living Expenses: Not very hard  Food Insecurity: Not on file  Transportation Needs: Not on file  Physical Activity: Not on file  Stress: Stress Concern Present  . Feeling of Stress : To some extent  Social Connections: Not on file   Outpatient Medications Prior to Visit  Medication Sig  . aspirin EC 81 MG tablet Take 81 mg by mouth daily. Swallow whole.  Marland Kitchen atorvastatin (LIPITOR) 20 MG tablet Take 1 tablet (20 mg total) by mouth daily.  . Blood Glucose Monitoring Suppl (ONE TOUCH ULTRA 2) w/Device KIT by Does not apply route.  Marland Kitchen glucose blood test strip 1 each by Other route as needed for other. Use as instructed. One touch ultra blue in vitro strips  . OneTouch Delica Lancets 32G MISC by Does not apply route.  . ramipril (ALTACE) 10 MG capsule Take 1 capsule (10 mg total) by mouth 2 (two) times daily.  . Semaglutide,0.25 or 0.5MG/DOS, (OZEMPIC, 0.25 OR 0.5 MG/DOSE,) 2 MG/1.5ML SOPN INJECT 0.25 MG INTO THE SKIN ONCE A WEEK FOR 4 WEEKS, THEN INCREASE TO 0.5MG WEEKLY  . [DISCONTINUED] metFORMIN (GLUCOPHAGE XR) 500 MG 24 hr tablet Take 1000 mg twice daily with meals.  Marland Kitchen omeprazole (PRILOSEC) 20 MG capsule Take 1 capsule (20 mg total) by mouth daily. (Patient not taking: Reported on 10/27/2020)  . polyethylene glycol (MIRALAX / GLYCOLAX) 17 g packet Take 17 g by mouth daily. (Patient not taking: Reported on 10/27/2020)   No facility-administered medications prior to visit.   Allergies  Allergen Reactions  . Sulfa Antibiotics     Immunization History  Administered Date(s) Administered  . Fluad Quad(high Dose 65+) 07/22/2020  . Influenza-Unspecified  07/17/2018  . Moderna Sars-Covid-2 Vaccination 01/13/2020, 02/10/2020  . Pneumococcal Conjugate-13 05/15/2017  . Pneumococcal-Unspecified 10/08/2009    Health Maintenance  Topic Date Due  . FOOT EXAM  Never done  . TETANUS/TDAP  Never done  . COLONOSCOPY (Pts 45-63yr Insurance coverage will need to be confirmed)  Never done  . MAMMOGRAM  Never done  . DEXA SCAN  Never done  . PNA vac Low Risk Adult (2 of 2 - PPSV23) 05/15/2018  . COVID-19 Vaccine (3 - Booster for Moderna series) 08/12/2020  . HEMOGLOBIN A1C  12/12/2020  . OPHTHALMOLOGY EXAM  08/24/2021  . INFLUENZA VACCINE  Completed  . Hepatitis C Screening  Completed    Patient Care Team: [name], FNP as PCP - General (Family Medicine) [name], RPH-CPP (Pharmacist)  Review of Systems  HENT: Positive for rhinorrhea.   Musculoskeletal: Positive for myalgias.  Neurological: Positive for headaches.  All other systems reviewed and are negative.     Objective    BP (!) 144/72   Pulse 78   Temp 98.2 F (36.8 C) (Oral)   Resp 16   Ht 5' 5"  (1.651 m)   Wt 162 lb 6.4 oz (73.7 kg)   SpO2 97%   BMI 27.02 kg/m  Physical Exam Vitals reviewed.  Constitutional:      General: She is not in acute distress.    Appearance: She is well-developed. She is not diaphoretic.     Interventions: She is not intubated. HENT:     Head: Normocephalic and atraumatic.     Jaw: There is normal jaw occlusion.      Right Ear: External ear normal.     Left Ear: External ear normal.     Nose: Nose normal.     Right Sinus: No maxillary sinus tenderness or frontal sinus tenderness.     Left Sinus: No maxillary sinus tenderness or frontal sinus tenderness.     Mouth/Throat:     Lips: Pink.     Mouth: Mucous membranes are moist.     Tongue: No lesions.     Pharynx: Oropharynx is clear. No oropharyngeal exudate.     Tonsils: No tonsillar exudate or tonsillar abscesses.  Eyes:     General: Lids are normal. Vision  grossly intact. No allergic shiner, visual field deficit or scleral icterus.       Right eye: No discharge.        Left eye: No discharge.     Extraocular Movements: Extraocular movements intact.     Conjunctiva/sclera: Conjunctivae normal.     Right eye: Right conjunctiva is not injected. No chemosis, exudate or hemorrhage.    Left eye: Left conjunctiva is not injected. No chemosis, exudate or hemorrhage.    Pupils: Pupils are equal, round, and reactive to light.  Neck:     Thyroid: No thyroid mass, thyromegaly or thyroid tenderness.     Vascular: Normal carotid pulses. No carotid bruit, hepatojugular reflux or JVD.     Trachea: Trachea and phonation normal. No tracheal tenderness or tracheal deviation.     Meningeal: Brudzinski's sign and Kernig's sign absent.  Cardiovascular:     Rate and Rhythm: Normal rate and regular rhythm.     Pulses: Normal pulses.          Radial pulses are 2+ on the right side and 2+ on the left side.       Dorsalis pedis pulses are 2+ on the right side and 2+ on the left side.       Posterior tibial pulses are 2+ on the right side and 2+ on the left side.     Heart sounds: Normal heart sounds, S1 normal and S2 normal. Heart sounds not distant. No murmur heard. No friction rub. No gallop.   Pulmonary:     Effort: Pulmonary effort is normal. No tachypnea, bradypnea, accessory muscle usage or respiratory distress. She is not intubated.     Breath sounds: Normal breath sounds. No stridor. No wheezing or rales.  Chest:     Chest wall:  No tenderness.  Breasts:     Right: No supraclavicular adenopathy.     Left: No supraclavicular adenopathy.      Comments: deferred until physical.  Abdominal:     General: Bowel sounds are normal. There is no distension or abdominal bruit.     Palpations: Abdomen is soft. There is no shifting dullness, fluid wave, hepatomegaly, splenomegaly, mass or pulsatile mass.     Tenderness: There is no abdominal tenderness. There is no  right CVA tenderness, left CVA tenderness, guarding or rebound. Negative signs include Murphy's sign and McBurney's sign.     Hernia: No hernia is present.  Genitourinary:    Comments: Deferred/ declined.  Musculoskeletal:        General: No tenderness or deformity. Normal range of motion.     Cervical back: Full passive range of motion without pain, normal range of motion and neck supple. No edema, erythema or rigidity. No spinous process tenderness or muscular tenderness. Normal range of motion.     Right lower leg: No edema.     Left lower leg: No edema.  Lymphadenopathy:     Head:     Right side of head: No submental, submandibular, tonsillar, preauricular, posterior auricular or occipital adenopathy.     Left side of head: No submental, submandibular, tonsillar, preauricular, posterior auricular or occipital adenopathy.     Cervical: No cervical adenopathy.     Right cervical: No superficial, deep or posterior cervical adenopathy.    Left cervical: No superficial, deep or posterior cervical adenopathy.     Upper Body:     Right upper body: No supraclavicular or pectoral adenopathy.     Left upper body: No supraclavicular or pectoral adenopathy.  Skin:    General: Skin is warm and dry.     Coloration: Skin is not pale.     Findings: No abrasion, bruising, burn, ecchymosis, erythema, lesion, petechiae or rash.     Nails: There is no clubbing.  Neurological:     Mental Status: She is alert and oriented to person, place, and time.     GCS: GCS eye subscore is 4. GCS verbal subscore is 5. GCS motor subscore is 6.     Cranial Nerves: No cranial nerve deficit.     Sensory: No sensory deficit.     Motor: No tremor, atrophy, abnormal muscle tone or seizure activity.     Coordination: Coordination normal.     Gait: Gait normal.     Deep Tendon Reflexes: Reflexes are normal and symmetric. Reflexes normal. Babinski sign absent on the right side. Babinski sign absent on the left side.      Reflex Scores:      Tricep reflexes are 2+ on the right side and 2+ on the left side.      Bicep reflexes are 2+ on the right side and 2+ on the left side.      Brachioradialis reflexes are 2+ on the right side and 2+ on the left side.      Patellar reflexes are 2+ on the right side and 2+ on the left side.      Achilles reflexes are 2+ on the right side and 2+ on the left side. Psychiatric:        Speech: Speech normal.        Behavior: Behavior normal.        Thought Content: Thought content normal.        Judgment: Judgment normal.    Depression Screen  PHQ 2/9 Scores 10/27/2020 06/14/2020  PHQ - 2 Score 2 0  PHQ- 9 Score 7 2   No results found for any visits on 10/27/20.  Assessment & Plan       Memory loss - Plan: Ambulatory referral to Neurology, Comprehensive Metabolic Panel (CMET), Lipid Panel w/o Chol/HDL Ratio, HgB A1c, Ambulatory referral to Neurosurgery, RPR, Vitamin B12, Sedimentation rate, CBC, TSH, Folate, MR BRAIN W WO CONTRAST, HIV antibody (with reflex), CCM pharmacy monitoring  Family history of GERD - Plan: CCM pharmacy monitoring  History of craniotomy - Plan: CCM pharmacy monitoring  Essential hypertension  Type 2 diabetes mellitus without complication, without long-term current use of insulin (HCC), Chronic  History of hepatitis B virus infection  Elevated liver enzymes  Hyperlipidemia, unspecified hyperlipidemia type  Hepatic steatosis  Overweight with body mass index (BMI) of 27 to 27.9 in adult  Orders Placed This Encounter  Procedures  . MR BRAIN W WO CONTRAST  . Comprehensive Metabolic Panel (CMET)  . Lipid Panel w/o Chol/HDL Ratio  . HgB A1c  . RPR  . Vitamin B12  . Sedimentation rate  . CBC  . TSH  . Folate  . HIV antibody (with reflex)  . Ambulatory referral to Neurology  . Ambulatory referral to Neurosurgery  . CCM pharmacy monitoring   Refill needed of the below medications.   Should hear from MRI within 1 week.  Should hear  from referrals within 1 - 2 weeks. Advised to call if she does not hear from all of these.    Meds ordered this encounter  Medications  . metFORMIN (GLUCOPHAGE XR) 500 MG 24 hr tablet    Sig: Take 1000 mg twice daily with meals.    Dispense:  180 tablet    Refill:  0   Keep gastrointestinal follow up as advised by gastroenterologist.   Red Flags discussed. The patient was given clear instructions to go to ER or return to medical center if any red flags develop, symptoms do not improve, worsen or new problems develop. They verbalized understanding.   Strict return precautions and to the emergency room given.  Red Flags discussed. The patient was given clear instructions to go to ER or return to medical center if any red flags develop, symptoms do not improve, worsen or new problems develop. They verbalized understanding.  Return in about 1 month (around 11/27/2020), or if symptoms worsen or fail to improve, for at any time for any worsening symptoms, Go to Emergency room/ urgent care if worse.    The entirety of the information documented in the History of Present Illness, Review of Systems and Physical Exam were personally obtained by me. Portions of this information were initially documented by the CMA and reviewed by me for thoroughness and accuracy.   I spent 55 minutes  dedicated to the care of this patient on 1/20/22the date of this encounter to include pre-visit review of records, face-to-face time with the patient discussing The primary encounter diagnosis was Memory loss. Diagnoses of Family history of GERD, History of craniotomy, Essential hypertension, Type 2 diabetes mellitus without complication, without long-term current use of insulin ([place]), History of hepatitis B virus infection, Elevated liver enzymes, Hyperlipidemia, unspecified hyperlipidemia type, Hepatic steatosis, and Overweight with body mass index (BMI) of 27 to 27.9 in adult were also pertinent to this visit.and post  visit ordering of testing.   Marcille Buffy, [place] [phone] (phone) [phone] (fax)  [place]

## 2020-10-27 NOTE — Patient Instructions (Addendum)
                                                                                                                                                                                                                                                                                                                                                                                                                                                                                                                                                                                                                                                                                                                                                                                                                                                                                                                                                                                                                                                                                                                                                                                                                                                                                                                                                                                                                                                                                                                                                                                                                                                                                                                                                                                                                                                                                                                                                                                                                                                                                                                                                                                                                                                                                                                                                                                                                                                                                                                                                                                                                                                                                                            Head Injury, Adult There are many types of head injuries. They can be as minor as a small bump. Some head injuries can be worse. Worse injuries include:  A strong hit to the head that shakes the brain back and forth, causing damage (concussion).  A bruise (contusion) of the brain. This means there is bleeding in the brain that can cause swelling.  A cracked skull (skull fracture).  Bleeding in the brain that gathers, gets thick (makes a clot), and forms a bump (hematoma). Most problems from a head injury come in the first 24 hours. However, you may still have side effects up to 7-10 days after your injury. It is important to  watch your condition for any changes. You may need to be watched in the emergency department or urgent care, or you may need to stay in the hospital. What are the causes? There are many possible causes of a head injury. A serious head injury may be caused by:  A car accident.  Bicycle or motorcycle accidents.  Sports injuries.  Falls.  Being hit by an object. What are the signs or symptoms? Symptoms of a head injury include a bruise, bump, or bleeding where the injury happened. Other physical symptoms may include:  Headache.  Feeling like you may vomit (nauseous) or vomiting.  Dizziness.  Blurred or double vision.  Being uncomfortable around bright lights or loud noises.  Shaking movements that you cannot control (seizures).  Feeling tired.  Trouble being woken up.  Fainting or loss of consciousness. Mental or emotional symptoms may include:  Feeling grumpy or cranky.  Confusion and memory problems.  Having trouble paying attention or concentrating.  Changes in eating or sleeping habits.  Feeling worried or nervous (anxious).  Feeling sad (depressed). How is this treated? Treatment for this condition depends on how severe the injury is and the type of injury you have. The main goal is to prevent problems and to allow the brain time to heal. Mild head injury If you have a mild head injury, you may be sent home, and treatment may include:  Being watched. A responsible adult should stay with you for 24 hours after your injury and check on you often.  Physical rest.  Brain rest.  Pain medicines. Severe head injury If you have a severe head injury, treatment may include:  Being watched closely. This includes staying in the hospital.  Medicines to: ? Help with pain. ? Prevent seizures. ? Help with brain swelling.  Protecting your airway and using a machine that helps you breathe (ventilator).  Treatments to watch for and manage swelling inside the  brain.  Brain surgery. This may be needed to: ? Remove a collection of blood or blood clots. ? Stop the bleeding. ? Remove a part of the skull. This allows room for the brain to swell. Follow these instructions at home: Activity  Rest.  Avoid activities that are hard or tiring.  Make sure you get enough sleep.  Let your brain rest. Do this by limiting activities that need a lot of thought or attention, such as: ? Watching TV. ? Playing memory games and puzzles. ? Job-related work or homework. ? Working on [employer], and texting.  Avoid activities that could cause another head injury until your doctor says it is okay. This includes playing sports. Having another head injury, especially before the first one has healed, can be dangerous.  Ask your doctor when it is safe for you to go back  to your normal activities, such as work or school. Ask your doctor for a step-by-step plan for slowly going back to your normal activities.  Ask your doctor when you can drive, ride a bicycle, or use heavy machinery. Do not do these activities if you are dizzy. Lifestyle  Do not drink alcohol until your doctor says it is okay.  Do not use drugs.  If it is harder than usual to remember things, write them down.  If you are easily distracted, try to do one thing at a time.  Talk with family members or close friends when making important decisions.  Tell your friends, family, a trusted co-worker, and work Freight forwarder about your injury, symptoms, and limits (restrictions). Have them watch for any problems that are new or getting worse.   General instructions  Take over-the-counter and prescription medicines only as told by your doctor.  Have someone stay with you for 24 hours after your head injury. This person should watch you for any changes in your symptoms and be ready to get help.  Keep all follow-up visits as told by your doctor. This is important. How is this  prevented?  Work on [employer]. This can help you avoid falls.  Wear a seat belt when you are in a moving vehicle.  Wear a helmet when you: ? Ride a bicycle. ? Ski. ? Do any other sport or activity that has a risk of injury.  If you drink alcohol: ? Limit how much you use to:  0-1 drink a day for nonpregnant women.  0-2 drinks a day for men. ? Be aware of how much alcohol is in your drink. In the [place] one drink equals one 12 oz bottle of beer (355 mL), one 5 oz glass of wine (148 mL), or one 1 oz glass of hard liquor (44 mL).  Make your home safer by: ? Getting rid of clutter from the floors and stairs. This includes things that can make you trip. ? Using grab bars in bathrooms and handrails by stairs. ? Placing non-slip mats on floors and in bathtubs. ? Putting more light in dim areas. Where to find more information  Centers for Disease Control and Prevention: http://www.wolf.info/ Get help right away if:  You have: ? A very bad headache that is not helped by medicine. ? Trouble walking or weakness in your arms and legs. ? Clear or bloody fluid coming from your nose or ears. ? Changes in how you see (vision). ? A seizure. ? More confusion or more grumpy moods.  Your symptoms get worse.  You are sleepier than normal and have trouble staying awake.  You lose your balance.  The black centers of your eyes (pupils) change in size.  Your speech is slurred.  Your dizziness gets worse.  You vomit. These symptoms may be an emergency. Do not wait to see if the symptoms will go away. Get medical help right away. Call your local emergency services (911 in the [place] Do not drive yourself to the hospital. Summary  Head injuries can be as minor as a small bump. Some head injuries can be worse.  Treatment for this condition depends on how severe the injury is and the type of injury you have.  Have someone stay with you for 24 hours after your head injury.  Ask your  doctor when it is safe for you to go back to your normal activities, such as work or school.  To prevent a head  injury, wear a seat belt in a car, wear a helmet when you use a bicycle, limit your alcohol use, and make your home safer. This information is not intended to replace advice given to you by your health care provider. Make sure you discuss any questions you have with your health care provider. Document Revised: 08/07/2019 Document Reviewed: 08/07/2019 Elsevier Patient Education  2021 Elsevier Inc.      Memory Compensation Strategies  6. Use "WARM" strategy.  W= write it down  A= associate it  R= repeat it  M= make a mental note  2.   You can keep a Glass blower/designerMemory Notebook.  Use a 3-ring notebook with sections for the following: calendar, important names and phone numbers,  medications, doctors' names/phone numbers, lists/reminders, and a section to journal what you did  each day.   3.    Use a calendar to write appointments down.  4.    Write yourself a schedule for the day.  This can be placed on the calendar or in a separate section of the Memory Notebook.  Keeping a  regular schedule can help memory.  5.    Use medication organizer with sections for each day or morning/evening pills.  You may need help loading it  6.    Keep a basket, or pegboard by the door.  Place items that you need to take out with you in the basket or on the pegboard.  You may also want to  include a message board for reminders.  7.    Use sticky notes.  Place sticky notes with reminders in a place where the task is performed.  For example: " turn off the  stove" placed by the stove, "lock the door" placed on the door at eye level, " take your medications" on  the bathroom mirror or by the place where you normally take your medications.  8.    Use alarms/timers.  Use while cooking to remind yourself to check on food or as a reminder to take your medicine, or as a  reminder to make a call, or as a  reminder to perform another task, etc. Health Maintenance, Female Adopting a healthy lifestyle and getting preventive care are important in promoting health and wellness. Ask your health care provider about:  The right schedule for you to have regular tests and exams.  Things you can do on your own to prevent diseases and keep yourself healthy. What should I know about diet, weight, and exercise? Eat a healthy diet  Eat a diet that includes plenty of vegetables, fruits, low-fat dairy products, and lean protein.  Do not eat a lot of foods that are high in solid fats, added sugars, or sodium.   Maintain a healthy weight Body mass index (BMI) is used to identify weight problems. It estimates body fat based on height and weight. Your health care provider can help determine your BMI and help you achieve or maintain a healthy weight. Get regular exercise Get regular exercise. This is one of the most important things you can do for your health. Most adults should:  Exercise for at least 150 minutes each week. The exercise should increase your heart rate and make you sweat (moderate-intensity exercise).  Do strengthening exercises at least twice a week. This is in addition to the moderate-intensity exercise.  Spend less time sitting. Even light physical activity can be beneficial. Watch cholesterol and blood lipids Have your blood tested for lipids and cholesterol at 69 years  of age, then have this test every 5 years. Have your cholesterol levels checked more often if:  Your lipid or cholesterol levels are high.  You are older than 69 years of age.  You are at high risk for heart disease. What should I know about cancer screening? Depending on your health history and family history, you may need to have cancer screening at various ages. This may include screening for:  Breast cancer.  Cervical cancer.  Colorectal cancer.  Skin cancer.  Lung cancer. What should I know about heart  disease, diabetes, and high blood pressure? Blood pressure and heart disease  High blood pressure causes heart disease and increases the risk of stroke. This is more likely to develop in people who have high blood pressure readings, are of African descent, or are overweight.  Have your blood pressure checked: ? Every 3-5 years if you are 47-41 years of age. ? Every year if you are 41 years old or older. Diabetes Have regular diabetes screenings. This checks your fasting blood sugar level. Have the screening done:  Once every three years after age 52 if you are at a normal weight and have a low risk for diabetes.  More often and at a younger age if you are overweight or have a high risk for diabetes. What should I know about preventing infection? Hepatitis B If you have a higher risk for hepatitis B, you should be screened for this virus. Talk with your health care provider to find out if you are at risk for hepatitis B infection. Hepatitis C Testing is recommended for:  Everyone born from [birth year] through 1964.  Anyone with known risk factors for hepatitis C. Sexually transmitted infections (STIs)  Get screened for STIs, including gonorrhea and chlamydia, if: ? You are sexually active and are younger than 69 years of age. ? You are older than 69 years of age and your health care provider tells you that you are at risk for this type of infection. ? Your sexual activity has changed since you were last screened, and you are at increased risk for chlamydia or gonorrhea. Ask your health care provider if you are at risk.  Ask your health care provider about whether you are at high risk for HIV. Your health care provider may recommend a prescription medicine to help prevent HIV infection. If you choose to take medicine to prevent HIV, you should first get tested for HIV. You should then be tested every 3 months for as long as you are taking the medicine. Pregnancy  If you are about to stop  having your period (premenopausal) and you may become pregnant, seek counseling before you get pregnant.  Take 400 to 800 micrograms (mcg) of folic acid every day if you become pregnant.  Ask for birth control (contraception) if you want to prevent pregnancy. Osteoporosis and menopause Osteoporosis is a disease in which the bones lose minerals and strength with aging. This can result in bone fractures. If you are 60 years old or older, or if you are at risk for osteoporosis and fractures, ask your health care provider if you should:  Be screened for bone loss.  Take a calcium or vitamin D supplement to lower your risk of fractures.  Be given hormone replacement therapy (HRT) to treat symptoms of menopause. Follow these instructions at home: Lifestyle  Do not use any products that contain nicotine or tobacco, such as cigarettes, e-cigarettes, and chewing tobacco. If you need help quitting, ask your  health care provider.  Do not use street drugs.  Do not share needles.  Ask your health care provider for help if you need support or information about quitting drugs. Alcohol use  Do not drink alcohol if: ? Your health care provider tells you not to drink. ? You are pregnant, may be pregnant, or are planning to become pregnant.  If you drink alcohol: ? Limit how much you use to 0-1 drink a day. ? Limit intake if you are breastfeeding.  Be aware of how much alcohol is in your drink. In the [place] one drink equals one 12 oz bottle of beer (355 mL), one 5 oz glass of wine (148 mL), or one 1 oz glass of hard liquor (44 mL). General instructions  Schedule regular health, dental, and eye exams.  Stay current with your vaccines.  Tell your health care provider if: ? You often feel depressed. ? You have ever been abused or do not feel safe at home. Summary  Adopting a healthy lifestyle and getting preventive care are important in promoting health and wellness.  Follow your health  care provider's instructions about healthy diet, exercising, and getting tested or screened for diseases.  Follow your health care provider's instructions on monitoring your cholesterol and blood pressure. This information is not intended to replace advice given to you by your health care provider. Make sure you discuss any questions you have with your health care provider. Document Revised: 09/17/2018 Document Reviewed: 09/17/2018 Elsevier Patient Education  2021 [place].   Hypertension, Adult High blood pressure (hypertension) is when the force of blood pumping through the arteries is too strong. The arteries are the blood vessels that carry blood from the heart throughout the body. Hypertension forces the heart to work harder to pump blood and may cause arteries to become narrow or stiff. Untreated or uncontrolled hypertension can cause a heart attack, heart failure, a stroke, kidney disease, and other problems. A blood pressure reading consists of a higher number over a lower number. Ideally, your blood pressure should be below 120/80. The first ("top") number is called the systolic pressure. It is a measure of the pressure in your arteries as your heart beats. The second ("bottom") number is called the diastolic pressure. It is a measure of the pressure in your arteries as the heart relaxes. What are the causes? The exact cause of this condition is not known. There are some conditions that result in or are related to high blood pressure. What increases the risk? Some risk factors for high blood pressure are under your control. The following factors may make you more likely to develop this condition:  Smoking.  Having type 2 diabetes mellitus, high cholesterol, or both.  Not getting enough exercise or physical activity.  Being overweight.  Having too much fat, sugar, calories, or salt (sodium) in your diet.  Drinking too much alcohol. Some risk factors for high blood pressure may  be difficult or impossible to change. Some of these factors include:  Having chronic kidney disease.  Having a family history of high blood pressure.  Age. Risk increases with age.  Race. You may be at higher risk if you are African American.  Gender. Men are at higher risk than women before age 64. After age 48, women are at higher risk than men.  Having obstructive sleep apnea.  Stress. What are the signs or symptoms? High blood pressure may not cause symptoms. Very high blood pressure (hypertensive crisis) may cause:  Headache.  Anxiety.  Shortness of breath.  Nosebleed.  Nausea and vomiting.  Vision changes.  Severe chest pain.  Seizures. How is this diagnosed? This condition is diagnosed by measuring your blood pressure while you are seated, with your arm resting on a flat surface, your legs uncrossed, and your feet flat on the floor. The cuff of the blood pressure monitor will be placed directly against the skin of your upper arm at the level of your heart. It should be measured at least twice using the same arm. Certain conditions can cause a difference in blood pressure between your right and left arms. Certain factors can cause blood pressure readings to be lower or higher than normal for a short period of time:  When your blood pressure is higher when you are in a health care provider's office than when you are at home, this is called white coat hypertension. Most people with this condition do not need medicines.  When your blood pressure is higher at home than when you are in a health care provider's office, this is called masked hypertension. Most people with this condition may need medicines to control blood pressure. If you have a high blood pressure reading during one visit or you have normal blood pressure with other risk factors, you may be asked to:  Return on a different day to have your blood pressure checked again.  Monitor your blood pressure at home  for 1 week or longer. If you are diagnosed with hypertension, you may have other blood or imaging tests to help your health care provider understand your overall risk for other conditions. How is this treated? This condition is treated by making healthy lifestyle changes, such as eating healthy foods, exercising more, and reducing your alcohol intake. Your health care provider may prescribe medicine if lifestyle changes are not enough to get your blood pressure under control, and if:  Your systolic blood pressure is above 130.  Your diastolic blood pressure is above 80. Your personal target blood pressure may vary depending on your medical conditions, your age, and other factors. Follow these instructions at home: Eating and drinking  Eat a diet that is high in fiber and potassium, and low in sodium, added sugar, and fat. An example eating plan is called the DASH (Dietary Approaches to Stop Hypertension) diet. To eat this way: ? Eat plenty of fresh fruits and vegetables. Try to fill one half of your plate at each meal with fruits and vegetables. ? Eat whole grains, such as whole-wheat pasta, brown rice, or whole-grain bread. Fill about one fourth of your plate with whole grains. ? Eat or drink low-fat dairy products, such as skim milk or low-fat yogurt. ? Avoid fatty cuts of meat, processed or cured meats, and poultry with skin. Fill about one fourth of your plate with lean proteins, such as fish, chicken without skin, beans, eggs, or tofu. ? Avoid pre-made and processed foods. These tend to be higher in sodium, added sugar, and fat.  Reduce your daily sodium intake. Most people with hypertension should eat less than 1,500 mg of sodium a day.  Do not drink alcohol if: ? Your health care provider tells you not to drink. ? You are pregnant, may be pregnant, or are planning to become pregnant.  If you drink alcohol: ? Limit how much you use to:  0-1 drink a day for women.  0-2 drinks a day  for men. ? Be aware of how much alcohol is in  your drink. In the [place] one drink equals one 12 oz bottle of beer (355 mL), one 5 oz glass of wine (148 mL), or one 1 oz glass of hard liquor (44 mL).   Lifestyle  Work with your health care provider to maintain a healthy body weight or to lose weight. Ask what an ideal weight is for you.  Get at least 30 minutes of exercise most days of the week. Activities may include walking, swimming, or biking.  Include exercise to strengthen your muscles (resistance exercise), such as Pilates or lifting weights, as part of your weekly exercise routine. Try to do these types of exercises for 30 minutes at least 3 days a week.  Do not use any products that contain nicotine or tobacco, such as cigarettes, e-cigarettes, and chewing tobacco. If you need help quitting, ask your health care provider.  Monitor your blood pressure at home as told by your health care provider.  Keep all follow-up visits as told by your health care provider. This is important.   Medicines  Take over-the-counter and prescription medicines only as told by your health care provider. Follow directions carefully. Blood pressure medicines must be taken as prescribed.  Do not skip doses of blood pressure medicine. Doing this puts you at risk for problems and can make the medicine less effective.  Ask your health care provider about side effects or reactions to medicines that you should watch for. Contact a health care provider if you:  Think you are having a reaction to a medicine you are taking.  Have headaches that keep coming back (recurring).  Feel dizzy.  Have swelling in your ankles.  Have trouble with your vision. Get help right away if you:  Develop a severe headache or confusion.  Have unusual weakness or numbness.  Feel faint.  Have severe pain in your chest or abdomen.  Vomit repeatedly.  Have trouble breathing. Summary  Hypertension is when the force of  blood pumping through your arteries is too strong. If this condition is not controlled, it may put you at risk for serious complications.  Your personal target blood pressure may vary depending on your medical conditions, your age, and other factors. For most people, a normal blood pressure is less than 120/80.  Hypertension is treated with lifestyle changes, medicines, or a combination of both. Lifestyle changes include losing weight, eating a healthy, low-sodium diet, exercising more, and limiting alcohol. This information is not intended to replace advice given to you by your health care provider. Make sure you discuss any questions you have with your health care provider. Document Revised: 06/04/2018 Document Reviewed: 06/04/2018 Elsevier Patient Education  2021 [place] Your Hypertension Hypertension, also called high blood pressure, is when the force of the blood pressing against the walls of the arteries is too strong. Arteries are blood vessels that carry blood from your heart throughout your body. Hypertension forces the heart to work harder to pump blood and may cause the arteries to become narrow or stiff. Understanding blood pressure readings Your personal target blood pressure may vary depending on your medical conditions, your age, and other factors. A blood pressure reading includes a higher number over a lower number. Ideally, your blood pressure should be below 120/80. You should know that:  The first, or top, number is called the systolic pressure. It is a measure of the pressure in your arteries as your heart beats.  The second, or bottom number, is called the diastolic  pressure. It is a measure of the pressure in your arteries as the heart relaxes. Blood pressure is classified into four stages. Based on your blood pressure reading, your health care provider may use the following stages to determine what type of treatment you need, if any. Systolic pressure and  diastolic pressure are measured in a unit called mmHg. Normal  Systolic pressure: below 123456.  Diastolic pressure: below 80. Elevated  Systolic pressure: [id number].  Diastolic pressure: below 80. Hypertension stage 1  Systolic pressure: 0000000.  Diastolic pressure: [id number]. Hypertension stage 2  Systolic pressure: [id number] or above.  Diastolic pressure: 90 or above. How can this condition affect me? Managing your hypertension is an important responsibility. Over time, hypertension can damage the arteries and decrease blood flow to important parts of the body, including the brain, heart, and kidneys. Having untreated or uncontrolled hypertension can lead to:  A heart attack.  A stroke.  A weakened blood vessel (aneurysm).  Heart failure.  Kidney damage.  Eye damage.  Metabolic syndrome.  Memory and concentration problems.  Vascular dementia. What actions can I take to manage this condition? Hypertension can be managed by making lifestyle changes and possibly by taking medicines. Your health care provider will help you make a plan to bring your blood pressure within a normal range. Nutrition  Eat a diet that is high in fiber and potassium, and low in salt (sodium), added sugar, and fat. An example eating plan is called the Dietary Approaches to Stop Hypertension (DASH) diet. To eat this way: ? Eat plenty of fresh fruits and vegetables. Try to fill one-half of your plate at each meal with fruits and vegetables. ? Eat whole grains, such as whole-wheat pasta, brown rice, or whole-grain bread. Fill about one-fourth of your plate with whole grains. ? Eat low-fat dairy products. ? Avoid fatty cuts of meat, processed or cured meats, and poultry with skin. Fill about one-fourth of your plate with lean proteins such as fish, chicken without skin, beans, eggs, and tofu. ? Avoid pre-made and processed foods. These tend to be higher in sodium, added sugar, and fat.  Reduce your daily  sodium intake. Most people with hypertension should eat less than 1,500 mg of sodium a day.   Lifestyle  Work with your health care provider to maintain a healthy body weight or to lose weight. Ask what an ideal weight is for you.  Get at least 30 minutes of exercise that causes your heart to beat faster (aerobic exercise) most days of the week. Activities may include walking, swimming, or biking.  Include exercise to strengthen your muscles (resistance exercise), such as weight lifting, as part of your weekly exercise routine. Try to do these types of exercises for 30 minutes at least 3 days a week.  Do not use any products that contain nicotine or tobacco, such as cigarettes, e-cigarettes, and chewing tobacco. If you need help quitting, ask your health care provider.  Control any long-term (chronic) conditions you have, such as high cholesterol or diabetes.  Identify your sources of stress and find ways to manage stress. This may include meditation, deep breathing, or making time for fun activities.   Alcohol use  Do not drink alcohol if: ? Your health care provider tells you not to drink. ? You are pregnant, may be pregnant, or are planning to become pregnant.  If you drink alcohol: ? Limit how much you use to:  0-1 drink a day for women.  0-2 drinks a day  for men. ? Be aware of how much alcohol is in your drink. In the [place] one drink equals one 12 oz bottle of beer (355 mL), one 5 oz glass of wine (148 mL), or one 1 oz glass of hard liquor (44 mL). Medicines Your health care provider may prescribe medicine if lifestyle changes are not enough to get your blood pressure under control and if:  Your systolic blood pressure is 130 or higher.  Your diastolic blood pressure is 80 or higher. Take medicines only as told by your health care provider. Follow the directions carefully. Blood pressure medicines must be taken as told by your health care provider. The medicine does not work as  well when you skip doses. Skipping doses also puts you at risk for problems. Monitoring Before you monitor your blood pressure:  Do not smoke, drink caffeinated beverages, or exercise within 30 minutes before taking a measurement.  Use the bathroom and empty your bladder (urinate).  Sit quietly for at least 5 minutes before taking measurements. Monitor your blood pressure at home as told by your health care provider. To do this:  Sit with your back straight and supported.  Place your feet flat on the floor. Do not cross your legs.  Support your arm on a flat surface, such as a table. Make sure your upper arm is at heart level.  Each time you measure, take two or three readings one minute apart and record the results. You may also need to have your blood pressure checked regularly by your health care provider.   General information  Talk with your health care provider about your diet, exercise habits, and other lifestyle factors that may be contributing to hypertension.  Review all the medicines you take with your health care provider because there may be side effects or interactions.  Keep all visits as told by your health care provider. Your health care provider can help you create and adjust your plan for managing your high blood pressure. Where to find more information  National Heart, Lung, and Blood Institute: https://wilson-eaton.com/  American Heart Association: www.heart.org Contact a health care provider if:  You think you are having a reaction to medicines you have taken.  You have repeated (recurrent) headaches.  You feel dizzy.  You have swelling in your ankles.  You have trouble with your vision. Get help right away if:  You develop a severe headache or confusion.  You have unusual weakness or numbness, or you feel faint.  You have severe pain in your chest or abdomen.  You vomit repeatedly.  You have trouble breathing. These symptoms may represent a serious  problem that is an emergency. Do not wait to see if the symptoms will go away. Get medical help right away. Call your local emergency services (911 in the [place] Do not drive yourself to the hospital. Summary  Hypertension is when the force of blood pumping through your arteries is too strong. If this condition is not controlled, it may put you at risk for serious complications.  Your personal target blood pressure may vary depending on your medical conditions, your age, and other factors. For most people, a normal blood pressure is less than 120/80.  Hypertension is managed by lifestyle changes, medicines, or both.  Lifestyle changes to help manage hypertension include losing weight, eating a healthy, low-sodium diet, exercising more, stopping smoking, and limiting alcohol. This information is not intended to replace advice given to you by your health care provider. Make  sure you discuss any questions you have with your health care provider. Document Revised: 10/30/2019 Document Reviewed: 08/25/2019 Elsevier Patient Education  2021 [place].   [email or web address]">  DASH Eating Plan DASH stands for Dietary Approaches to Stop Hypertension. The DASH eating plan is a healthy eating plan that has been shown to:  Reduce high blood pressure (hypertension).  Reduce your risk for type 2 diabetes, heart disease, and stroke.  Help with weight loss. What are tips for following this plan? Reading food labels  Check food labels for the amount of salt (sodium) per serving. Choose foods with less than 5 percent of the Daily Value of sodium. Generally, foods with less than 300 milligrams (mg) of sodium per serving fit into this eating plan.  To find whole grains, look for the word "whole" as the first word in the ingredient list. Shopping  Buy products labeled as "low-sodium" or "no salt added."  Buy fresh foods. Avoid canned foods and pre-made or  frozen meals. Cooking  Avoid adding salt when cooking. Use salt-free seasonings or herbs instead of table salt or sea salt. Check with your health care provider or pharmacist before using salt substitutes.  Do not fry foods. Cook foods using healthy methods such as baking, boiling, grilling, roasting, and broiling instead.  Cook with heart-healthy oils, such as olive, canola, avocado, soybean, or sunflower oil. Meal planning  Eat a balanced diet that includes: ? 4 or more servings of fruits and 4 or more servings of vegetables each day. Try to fill one-half of your plate with fruits and vegetables. ? 6-8 servings of whole grains each day. ? Less than 6 oz (170 g) of lean meat, poultry, or fish each day. A 3-oz (85-g) serving of meat is about the same size as a deck of cards. One egg equals 1 oz (28 g). ? 2-3 servings of low-fat dairy each day. One serving is 1 cup (237 mL). ? 1 serving of nuts, seeds, or beans 5 times each week. ? 2-3 servings of heart-healthy fats. Healthy fats called omega-3 fatty acids are found in foods such as walnuts, flaxseeds, fortified milks, and eggs. These fats are also found in cold-water fish, such as sardines, salmon, and mackerel.  Limit how much you eat of: ? Canned or prepackaged foods. ? Food that is high in trans fat, such as some fried foods. ? Food that is high in saturated fat, such as fatty meat. ? Desserts and other sweets, sugary drinks, and other foods with added sugar. ? Full-fat dairy products.  Do not salt foods before eating.  Do not eat more than 4 egg yolks a week.  Try to eat at least 2 vegetarian meals a week.  Eat more home-cooked food and less restaurant, buffet, and fast food.   Lifestyle  When eating at a restaurant, ask that your food be prepared with less salt or no salt, if possible.  If you drink alcohol: ? Limit how much you use to:  0-1 drink a day for women who are not pregnant.  0-2 drinks a day for men. ? Be  aware of how much alcohol is in your drink. In the [place] one drink equals one 12 oz bottle of beer (355 mL), one 5 oz glass of wine (148 mL), or one 1 oz glass of hard liquor (44 mL). General information  Avoid eating more than 2,300 mg of salt a day. If you have hypertension, you may need to reduce your sodium intake to  1,500 mg a day.  Work with your health care provider to maintain a healthy body weight or to lose weight. Ask what an ideal weight is for you.  Get at least 30 minutes of exercise that causes your heart to beat faster (aerobic exercise) most days of the week. Activities may include walking, swimming, or biking.  Work with your health care provider or dietitian to adjust your eating plan to your individual calorie needs. What foods should I eat? Fruits All fresh, dried, or frozen fruit. Canned fruit in natural juice (without added sugar). Vegetables Fresh or frozen vegetables (raw, steamed, roasted, or grilled). Low-sodium or reduced-sodium tomato and vegetable juice. Low-sodium or reduced-sodium tomato sauce and tomato paste. Low-sodium or reduced-sodium canned vegetables. Grains Whole-grain or whole-wheat bread. Whole-grain or whole-wheat pasta. Brown rice. Orpah Cobbatmeal. Quinoa. Bulgur. Whole-grain and low-sodium cereals. Pita bread. Low-fat, low-sodium crackers. Whole-wheat flour tortillas. Meats and other proteins Skinless chicken or [place]. Ground chicken or [place]. Pork with fat trimmed off. Fish and seafood. Egg whites. Dried beans, peas, or lentils. Unsalted nuts, nut butters, and seeds. Unsalted canned beans. Lean cuts of beef with fat trimmed off. Low-sodium, lean precooked or cured meat, such as sausages or meat loaves. Dairy Low-fat (1%) or fat-free (skim) milk. Reduced-fat, low-fat, or fat-free cheeses. Nonfat, low-sodium ricotta or cottage cheese. Low-fat or nonfat yogurt. Low-fat, low-sodium cheese. Fats and oils Soft margarine without trans fats. Vegetable oil.  Reduced-fat, low-fat, or light mayonnaise and salad dressings (reduced-sodium). Canola, safflower, olive, avocado, soybean, and sunflower oils. Avocado. Seasonings and condiments Herbs. Spices. Seasoning mixes without salt. Other foods Unsalted popcorn and pretzels. Fat-free sweets. The items listed above may not be a complete list of foods and beverages you can eat. Contact a dietitian for more information. What foods should I avoid? Fruits Canned fruit in a light or heavy syrup. Fried fruit. Fruit in cream or butter sauce. Vegetables Creamed or fried vegetables. Vegetables in a cheese sauce. Regular canned vegetables (not low-sodium or reduced-sodium). Regular canned tomato sauce and paste (not low-sodium or reduced-sodium). Regular tomato and vegetable juice (not low-sodium or reduced-sodium). Rosita FirePickles. Olives. Grains Baked goods made with fat, such as croissants, muffins, or some breads. Dry pasta or rice meal packs. Meats and other proteins Fatty cuts of meat. Ribs. Fried meat. Tomasa BlaseBacon. Bologna, salami, and other precooked or cured meats, such as sausages or meat loaves. Fat from the back of a pig (fatback). Bratwurst. Salted nuts and seeds. Canned beans with added salt. Canned or smoked fish. Whole eggs or egg yolks. Chicken or [place] with skin. Dairy Whole or 2% milk, cream, and half-and-half. Whole or full-fat cream cheese. Whole-fat or sweetened yogurt. Full-fat cheese. Nondairy creamers. Whipped toppings. Processed cheese and cheese spreads. Fats and oils Butter. Stick margarine. Lard. Shortening. Ghee. Bacon fat. Tropical oils, such as coconut, palm kernel, or palm oil. Seasonings and condiments Onion salt, garlic salt, seasoned salt, table salt, and sea salt. Worcestershire sauce. Tartar sauce. Barbecue sauce. Teriyaki sauce. Soy sauce, including reduced-sodium. Steak sauce. Canned and packaged gravies. Fish sauce. Oyster sauce. Cocktail sauce. Store-bought horseradish. Ketchup. Mustard.  Meat flavorings and tenderizers. Bouillon cubes. Hot sauces. Pre-made or packaged marinades. Pre-made or packaged taco seasonings. Relishes. Regular salad dressings. Other foods Salted popcorn and pretzels. The items listed above may not be a complete list of foods and beverages you should avoid. Contact a dietitian for more information. Where to find more information  National Heart, Lung, and Blood Institute: PopSteam.iswww.nhlbi.nih.gov  American Heart Association: www.heart.org  Academy of Nutrition and Dietetics: www.eatright.[place]: www.kidney.org Summary  The DASH eating plan is a healthy eating plan that has been shown to reduce high blood pressure (hypertension). It may also reduce your risk for type 2 diabetes, heart disease, and stroke.  When on the DASH eating plan, aim to eat more fresh fruits and vegetables, whole grains, lean proteins, low-fat dairy, and heart-healthy fats.  With the DASH eating plan, you should limit salt (sodium) intake to 2,300 mg a day. If you have hypertension, you may need to reduce your sodium intake to 1,500 mg a day.  Work with your health care provider or dietitian to adjust your eating plan to your individual calorie needs. This information is not intended to replace advice given to you by your health care provider. Make sure you discuss any questions you have with your health care provider. Document Revised: 08/28/2019 Document Reviewed: 08/28/2019 Elsevier Patient Education  2021 [place].     Why follow it? Research shows. . Those who follow the Mediterranean diet have a reduced risk of heart disease  . The diet is associated with a reduced incidence of Parkinson's and Alzheimer's diseases . People following the diet may have longer life expectancies and lower rates of chronic diseases  . The Dietary Guidelines for Americans recommends the Mediterranean diet as an eating plan to promote health and prevent disease  What  Is the Mediterranean Diet?  . Healthy eating plan based on typical foods and recipes of Mediterranean-style cooking . The diet is primarily a plant based diet; these foods should make up a majority of meals   Starches - Plant based foods should make up a majority of meals - They are an important sources of vitamins, minerals, energy, antioxidants, and fiber - Choose whole grains, foods high in fiber and minimally processed items  - Typical grain sources include wheat, oats, barley, corn, brown rice, bulgar, farro, millet, polenta, couscous  - Various types of beans include chickpeas, lentils, fava beans, black beans, white beans   Fruits  Veggies - Large quantities of antioxidant rich fruits & veggies; 6 or more servings  - Vegetables can be eaten raw or lightly drizzled with oil and cooked  - Vegetables common to the traditional Mediterranean Diet include: artichokes, arugula, beets, broccoli, brussel sprouts, cabbage, carrots, celery, collard greens, cucumbers, eggplant, kale, leeks, lemons, lettuce, mushrooms, okra, onions, peas, peppers, potatoes, pumpkin, radishes, rutabaga, shallots, spinach, sweet potatoes, turnips, zucchini - Fruits common to the Mediterranean Diet include: apples, apricots, avocados, cherries, clementines, dates, figs, grapefruits, grapes, melons, nectarines, oranges, peaches, pears, pomegranates, strawberries, tangerines  Fats - Replace butter and margarine with healthy oils, such as olive oil, canola oil, and tahini  - Limit nuts to no more than a handful a day  - Nuts include walnuts, almonds, pecans, pistachios, pine nuts  - Limit or avoid candied, honey roasted or heavily salted nuts - Olives are central to the [employer] - can be eaten whole or used in a variety of dishes   Meats Protein - Limiting red meat: no more than a few times a month - When eating red meat: choose lean cuts and keep the portion to the size of deck of cards - Eggs: approx. 0 to 4  times a week  - Fish and lean poultry: at least 2 a week  - Healthy protein sources include, chicken, [place], lean beef, lamb - Increase intake of seafood such as tuna, salmon, trout, mackerel, shrimp,  scallops - Avoid or limit high fat processed meats such as sausage and bacon  Dairy - Include moderate amounts of low fat dairy products  - Focus on healthy dairy such as fat free yogurt, skim milk, low or reduced fat cheese - Limit dairy products higher in fat such as whole or 2% milk, cheese, ice cream  Alcohol - Moderate amounts of red wine is ok  - No more than 5 oz daily for women (all ages) and men older than age 66  - No more than 10 oz of wine daily for men younger than 8  Other - Limit sweets and other desserts  - Use herbs and spices instead of salt to flavor foods  - Herbs and spices common to the traditional Mediterranean Diet include: basil, bay leaves, chives, cloves, cumin, fennel, garlic, lavender, marjoram, mint, oregano, parsley, pepper, rosemary, sage, savory, sumac, tarragon, thyme   It's not just a diet, it's a lifestyle:  . The Mediterranean diet includes lifestyle factors typical of those in the region  . Foods, drinks and meals are best eaten with others and savored . Daily physical activity is important for overall good health . This could be strenuous exercise like running and aerobics . This could also be more leisurely activities such as walking, housework, yard-work, or taking the stairs . Moderation is the key; a balanced and healthy diet accommodates most foods and drinks . Consider portion sizes and frequency of consumption of certain foods   Meal Ideas & Options:  . Breakfast:  o Whole wheat toast or whole wheat English muffins with peanut butter & hard boiled egg o Steel cut oats topped with apples & cinnamon and skim milk  o Fresh fruit: banana, strawberries, melon, berries, peaches  o Smoothies: strawberries, bananas, greek yogurt, peanut butter o Low  fat greek yogurt with blueberries and granola  o Egg white omelet with spinach and mushrooms o Breakfast couscous: whole wheat couscous, apricots, skim milk, cranberries  . Sandwiches:  o Hummus and grilled vegetables (peppers, zucchini, squash) on whole wheat bread   o Grilled chicken on whole wheat pita with lettuce, tomatoes, cucumbers or tzatziki  o Tuna salad on whole wheat bread: tuna salad made with greek yogurt, olives, red peppers, capers, green onions o Garlic rosemary lamb pita: lamb sauted with garlic, rosemary, salt & pepper; add lettuce, cucumber, greek yogurt to pita - flavor with lemon juice and black pepper  . Seafood:  o Mediterranean grilled salmon, seasoned with garlic, basil, parsley, lemon juice and black pepper o Shrimp, lemon, and spinach whole-grain pasta salad made with low fat greek yogurt  o Seared scallops with lemon orzo  o Seared tuna steaks seasoned salt, pepper, coriander topped with tomato mixture of olives, tomatoes, olive oil, minced garlic, parsley, green onions and cappers  . Meats:  o Herbed greek chicken salad with kalamata olives, cucumber, feta  o Red bell peppers stuffed with spinach, bulgur, lean ground beef (or lentils) & topped with feta   o Kebabs: skewers of chicken, tomatoes, onions, zucchini, squash  o [place] burgers: made with red onions, mint, dill, lemon juice, feta cheese topped with roasted red peppers . Vegetarian o Cucumber salad: cucumbers, artichoke hearts, celery, red onion, feta cheese, tossed in olive oil & lemon juice  o Hummus and whole grain pita points with a greek salad (lettuce, tomato, feta, olives, cucumbers, red onion) o Lentil soup with celery, carrots made with vegetable broth, garlic, salt and pepper  o Tabouli  salad: parsley, bulgur, mint, scallions, cucumbers, tomato, radishes, lemon juice, olive oil, salt and pepper.

## 2020-10-28 ENCOUNTER — Other Ambulatory Visit: Payer: Self-pay

## 2020-10-28 ENCOUNTER — Ambulatory Visit
Admission: RE | Admit: 2020-10-28 | Discharge: 2020-10-28 | Disposition: A | Discharge status: Home / Self Care | Payer: Medicare Other | Source: Ambulatory Visit | Attending: Adult Health | Admitting: Adult Health

## 2020-10-28 DIAGNOSIS — I6782 Cerebral ischemia: Secondary | ICD-10-CM | POA: Diagnosis not present

## 2020-10-28 DIAGNOSIS — R413 Other amnesia: Secondary | ICD-10-CM | POA: Diagnosis not present

## 2020-10-28 DIAGNOSIS — G9389 Other specified disorders of brain: Secondary | ICD-10-CM | POA: Diagnosis not present

## 2020-10-28 DIAGNOSIS — G319 Degenerative disease of nervous system, unspecified: Secondary | ICD-10-CM | POA: Diagnosis not present

## 2020-10-28 LAB — COMPREHENSIVE METABOLIC PANEL
ALT: 32 IU/L (ref 0–32)
AST: 33 IU/L (ref 0–40)
Albumin/Globulin Ratio: 1.5 (ref 1.2–2.2)
Albumin: 4.8 g/dL (ref 3.8–4.8)
Alkaline Phosphatase: 84 IU/L (ref 44–121)
BUN/Creatinine Ratio: 16 (ref 12–28)
BUN: 12 mg/dL (ref 8–27)
Bilirubin Total: 0.3 mg/dL (ref 0.0–1.2)
CO2: 25 mmol/L (ref 20–29)
Calcium: 9.7 mg/dL (ref 8.7–10.3)
Chloride: 99 mmol/L (ref 96–106)
Creatinine, Ser: 0.76 mg/dL (ref 0.57–1.00)
GFR calc Af Amer: 93 mL/min/{1.73_m2} (ref >59)
GFR calc non Af Amer: 81 mL/min/{1.73_m2} (ref >59)
Globulin, Total: 3.2 g/dL (ref 1.5–4.5)
Glucose: 137 mg/dL — ABNORMAL HIGH (ref 65–99)
Potassium: 4.1 mmol/L (ref 3.5–5.2)
Sodium: 138 mmol/L (ref 134–144)
Total Protein: 8 g/dL (ref 6.0–8.5)

## 2020-10-28 LAB — POCT URINALYSIS DIPSTICK
Blood, UA: NEGATIVE
Glucose, UA: NEGATIVE
Leukocytes, UA: NEGATIVE
Nitrite, UA: NEGATIVE
Protein, UA: NEGATIVE
Spec Grav, UA: 1.015 (ref 1.010–1.025)
Urobilinogen, UA: 0.2 E.U./dL
pH, UA: 5 (ref 5.0–8.0)

## 2020-10-28 LAB — LIPID PANEL W/O CHOL/HDL RATIO
Cholesterol, Total: 139 mg/dL (ref 100–199)
HDL: 32 mg/dL — ABNORMAL LOW (ref >39)
LDL Chol Calc (NIH): 74 mg/dL (ref 0–99)
Triglycerides: 192 mg/dL — ABNORMAL HIGH (ref 0–149)
VLDL Cholesterol Cal: 33 mg/dL (ref 5–40)

## 2020-10-28 LAB — CBC
Hematocrit: 40.4 % (ref 34.0–46.6)
Hemoglobin: 13 g/dL (ref 11.1–15.9)
MCH: 27.1 pg (ref 26.6–33.0)
MCHC: 32.2 g/dL (ref 31.5–35.7)
MCV: 84 fL (ref 79–97)
Platelets: 324 10*3/uL (ref 150–450)
RBC: 4.8 x10E6/uL (ref 3.77–5.28)
RDW: 13.2 % (ref 11.7–15.4)
WBC: 8.3 10*3/uL (ref 3.4–10.8)

## 2020-10-28 LAB — HEMOGLOBIN A1C
Est. average glucose Bld gHb Est-mCnc: 171 mg/dL
Hgb A1c MFr Bld: 7.6 % — ABNORMAL HIGH (ref 4.8–5.6)

## 2020-10-28 LAB — VITAMIN B12: Vitamin B-12: 368 pg/mL (ref 232–1245)

## 2020-10-28 LAB — TSH: TSH: 1.92 u[IU]/mL (ref 0.450–4.500)

## 2020-10-28 LAB — RPR: RPR Ser Ql: NONREACTIVE

## 2020-10-28 LAB — HIV ANTIBODY (ROUTINE TESTING W REFLEX): HIV Screen 4th Generation wRfx: NONREACTIVE

## 2020-10-28 LAB — FOLATE: Folate: 3.4 ng/mL (ref >3.0)

## 2020-10-28 LAB — SEDIMENTATION RATE: Sed Rate: 26 mm/hr (ref 0–40)

## 2020-10-28 MED ORDER — GADOBUTROL 1 MMOL/ML IV SOLN
7.0000 mL | Freq: Once | INTRAVENOUS | Status: AC | PRN
  Administered 2020-10-28: 7 mL via INTRAVENOUS

## 2020-10-28 NOTE — Progress Notes (Signed)
MRI shows mild cerebral changes and chronic small vessel disease which is likely normal for age, however given her history of falls and the thickening shown on her MRI at areas of fall and craniotomy past surgical site I do advise her to be evaluated at the neurosurgeon and see Dr. Brigitte Pulse in neurology.  She also has some Paranasal sinus disease as described, most notably right frontal area, if sinuses are bothering her would advise ENT.   IMPRESSION: No evidence of acute intracranial abnormality.  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 Findings likely reflect chronic sequela of prior subdural hematomas given the provided history.  Mild cerebral atrophy and chronic small vessel ischemic disease.  Paranasal sinus disease as described, most notably right frontal and anterior ethmoid.   Electronically Signed   By: Kellie Simmering DO   On: 10/28/2020 10:22

## 2020-10-28 NOTE — Addendum Note (Signed)
Addended by: Minette Headland on: 10/28/2020 10:17 AM   Modules accepted: Orders

## 2020-11-02 ENCOUNTER — Other Ambulatory Visit: Payer: Self-pay

## 2020-11-02 ENCOUNTER — Other Ambulatory Visit: Payer: Self-pay | Admitting: Dermatology

## 2020-11-02 ENCOUNTER — Ambulatory Visit: Payer: Medicare Other | Admitting: Dermatology

## 2020-11-02 DIAGNOSIS — L814 Other melanin hyperpigmentation: Secondary | ICD-10-CM

## 2020-11-02 DIAGNOSIS — D1800 Hemangioma unspecified site: Secondary | ICD-10-CM | POA: Diagnosis not present

## 2020-11-02 DIAGNOSIS — L0291 Cutaneous abscess, unspecified: Secondary | ICD-10-CM

## 2020-11-02 DIAGNOSIS — D229 Melanocytic nevi, unspecified: Secondary | ICD-10-CM | POA: Diagnosis not present

## 2020-11-02 DIAGNOSIS — Z1283 Encounter for screening for malignant neoplasm of skin: Secondary | ICD-10-CM | POA: Diagnosis not present

## 2020-11-02 DIAGNOSIS — L02411 Cutaneous abscess of right axilla: Secondary | ICD-10-CM

## 2020-11-02 DIAGNOSIS — L578 Other skin changes due to chronic exposure to nonionizing radiation: Secondary | ICD-10-CM | POA: Diagnosis not present

## 2020-11-02 DIAGNOSIS — L03111 Cellulitis of right axilla: Secondary | ICD-10-CM

## 2020-11-02 DIAGNOSIS — L821 Other seborrheic keratosis: Secondary | ICD-10-CM

## 2020-11-02 MED ORDER — MUPIROCIN 2 % EX OINT: 1.0000 "application " | TOPICAL_OINTMENT | Freq: Two times a day (BID) | CUTANEOUS | 1 refills | Status: DC

## 2020-11-02 MED ORDER — DOXYCYCLINE MONOHYDRATE 100 MG PO CAPS: 100.0000 mg | ORAL_CAPSULE | Freq: Two times a day (BID) | ORAL | 0 refills | Status: DC

## 2020-11-02 NOTE — Progress Notes (Signed)
New Patient Visit  Subjective  Tiffany Gregory is a 70 y.o. female who presents for the following: hx of lump on scalp (resolved), Hidradenitis? (R axilla, 1 day this episode, similar episode 12/21, pt was given abx  Augmentin,and it resolved), and Total body skin exam (No hx of skin ca, ). The patient presents for Total-Body Skin Exam (TBSE) for skin cancer screening and mole check.  New patient referral from [name] FNP  The following portions of the chart were reviewed this encounter and updated as appropriate:   Tobacco  Allergies  Meds  Problems  Med Hx  Surg Hx  Fam Hx     Review of Systems:  No other skin or systemic complaints except as noted in HPI or Assessment and Plan.  Objective  Well appearing patient in no apparent distress; mood and affect are within normal limits.  A full examination was performed including scalp, head, eyes, ears, nose, lips, neck, chest, axillae, abdomen, back, buttocks, bilateral upper extremities, bilateral lower extremities, hands, feet, fingers, toes, fingernails, and toenails. All findings within normal limits unless otherwise noted below.  Objective  Right Axilla: 2.0cm red nodule    Assessment & Plan   Lentigines - Scattered tan macules - Discussed due to sun exposure - Benign, observe - Call for any changes  Seborrheic Keratoses - Stuck-on, waxy, tan-brown papules and plaques  - Discussed benign etiology and prognosis. - Observe - Call for any changes  Melanocytic Nevi - Tan-brown and/or pink-flesh-colored symmetric macules and papules - Benign appearing on exam today - Observation - Call clinic for new or changing moles - Recommend daily use of broad spectrum spf 30+ sunscreen to sun-exposed areas.   Hemangiomas - Red papules - Discussed benign nature - Observe - Call for any changes  Actinic Damage - Chronic, secondary to cumulative UV/sun exposure - diffuse scaly erythematous macules with underlying  dyspigmentation - Recommend daily broad spectrum sunscreen SPF 30+ to sun-exposed areas, reapply every 2 hours as needed.  - Call for new or changing lesions.  Skin cancer screening performed today.  Abscess with possible Staph or Strep furunculosis and Cellulitis Right Axilla  Chronic, persistent; recurrent  Start Doxycycline 100mg  1 po qd with food and drink x 10 days Start Mupirocin oint bid axilla and qd to each nostril Start Cln wash qd  Incision and Drainage - Right Axilla Location: R axilla  Informed Consent: Discussed risks (permanent scarring, light or dark discoloration, infection, pain, bleeding, bruising, redness, damage to adjacent structures, and recurrence of the lesion) and benefits of the procedure, as well as the alternatives.  Informed consent was obtained.  Preparation: The area was prepped with alcohol.  Anesthesia: Lidocaine 1% with epinephrine  Procedure Details: An incision was made overlying the lesion. The lesion drained pus and blood.  A small amount of fluid was drained.    Antibiotic ointment and a sterile pressure dressing were applied. The patient tolerated procedure well.  Total number of lesions drained: 2  Plan: The patient was instructed on post-op care. Recommend OTC analgesia as needed for pain.   doxycycline (MONODOX) 100 MG capsule - Right Axilla  mupirocin ointment (BACTROBAN) 2 % - Right Axilla  Other Related Procedures Aerobic culture  Return in about 6 weeks (around 12/14/2020) for abcess f/u.  I, Othelia Pulling, RMA, am acting as scribe for Sarina Ser, MD .  Documentation: I have reviewed the above documentation for accuracy and completeness, and I agree with the above.  Sarina Ser,  MD

## 2020-11-04 ENCOUNTER — Encounter: Payer: Self-pay | Admitting: Dermatology

## 2020-11-05 LAB — AEROBIC CULTURE

## 2020-11-22 ENCOUNTER — Telehealth: Payer: Self-pay

## 2020-11-22 NOTE — Telephone Encounter (Signed)
-----   Message from [name], MD sent at 11/14/2020  6:19 PM EST ----- + culture for MRSA Sensitive to TCN Continue Doxycycline and Mupirocin as prescribed. Keep 6 week follow up appt.  Is pt doing better?

## 2020-11-22 NOTE — Telephone Encounter (Signed)
Patient advised of culture results and states that she finished course of Doxycycline about 1-2 weeks ago, and area is doing fine. She states that she is still not wearing deodorant on aa and I advised her that if she was using the deodorant stick while the abscess was active then she should throw it away and buy a new one so she doesn't re-infect the area. Patient also c/o all over itching which started a few weeks ago. I asked if she wanted me to look for a sooner appointment and she said that she would just wait until her follow up and mention it to her PCP in the mean time. Discussed with patient CeraVe moisturizer after showering and Allegra daily to help with itching, but patient states that she had a traumatic brain injury and isn't sure if Allegra is safe to use so I told her to ask her PCP if safe before using.

## 2020-11-28 NOTE — Progress Notes (Unsigned)
Subjective:   Tiffany Gregory is a 70 y.o. female who presents for an Initial Medicare Annual Wellness Visit.  I connected with Tiffany Gregory today by telephone and verified that I am speaking with the correct person using two identifiers. Location patient: home Location provider: work Persons participating in the virtual visit: patient, provider.   I discussed the limitations, risks, security and privacy concerns of performing an evaluation and management service by telephone and the availability of in person appointments. I also discussed with the patient that there may be a patient responsible charge related to this service. The patient expressed understanding and verbally consented to this telephonic visit.    Interactive audio and video telecommunications were attempted between this provider and patient, however failed, due to patient having technical difficulties OR patient did not have access to video capability.  We continued and completed visit with audio only.   Review of Systems    N/A  Cardiac Risk Factors include: advanced age (>72mn, >>31women)     Objective:    There were no vitals filed for this visit. There is no height or weight on file to calculate BMI.  Advanced Directives 11/29/2020 10/19/2020  Does Patient Have a Medical Advance Directive? Yes Yes  Type [place] Living will  Copy of HFarwellin Chart? No - copy requested -    Current Medications (verified) Outpatient Encounter Medications as of 11/29/2020  Medication Sig  . aspirin EC 81 MG tablet Take 81 mg by mouth daily. Swallow whole.  .Marland Kitchenatorvastatin (LIPITOR) 20 MG tablet Take 1 tablet (20 mg total) by mouth daily.  . Blood Glucose Monitoring Suppl (ONE TOUCH ULTRA 2) w/Device KIT by Does not apply route.  .Marland Kitchenglucose blood test strip 1 each by Other route as needed for other. Use as instructed. One touch ultra blue in vitro strips   . metFORMIN (GLUCOPHAGE XR) 500 MG 24 hr tablet Take 2 tablets (1,000 mg total) by mouth daily with breakfast.  . omeprazole (PRILOSEC) 20 MG capsule Take 2 capsules (40 mg total) by mouth daily.  .Glory RosebushDelica Lancets 340HMISC by Does not apply route.  . polyethylene glycol (MIRALAX / GLYCOLAX) 17 g packet Take 17 g by mouth daily.  . ramipril (ALTACE) 10 MG capsule Take 1 capsule (10 mg total) by mouth 2 (two) times daily.  . Semaglutide,0.25 or 0.5MG/DOS, (OZEMPIC, 0.25 OR 0.5 MG/DOSE,) 2 MG/1.5ML SOPN Inject 0.5 mg into the skin once a week. INJECT 0.25 MG INTO THE SKIN ONCE A WEEK FOR 4 WEEKS, THEN INCREASE TO 0.5MG WEEKLY  . [DISCONTINUED] atorvastatin (LIPITOR) 20 MG tablet Take 1 tablet (20 mg total) by mouth daily.  . [DISCONTINUED] doxycycline (MONODOX) 100 MG capsule Take 1 capsule (100 mg total) by mouth 2 (two) times daily. Take with food and drink  . [DISCONTINUED] metFORMIN (GLUCOPHAGE XR) 500 MG 24 hr tablet Take 1000 mg twice daily with meals.  . [DISCONTINUED] mupirocin ointment (BACTROBAN) 2 % Apply 1 application topically 2 (two) times daily. Bid to wound R axilla  . [DISCONTINUED] omeprazole (PRILOSEC) 20 MG capsule Take 1 capsule (20 mg total) by mouth daily.  . [DISCONTINUED] ramipril (ALTACE) 10 MG capsule Take 1 capsule (10 mg total) by mouth 2 (two) times daily.  . [DISCONTINUED] Semaglutide,0.25 or 0.5MG/DOS, (OZEMPIC, 0.25 OR 0.5 MG/DOSE,) 2 MG/1.5ML SOPN INJECT 0.25 MG INTO THE SKIN ONCE A WEEK FOR 4 WEEKS, THEN INCREASE TO 0.5MG  WEEKLY   No facility-administered encounter medications on file as of 11/29/2020.    Allergies (verified) Sulfa antibiotics   History: Past Medical History:  Diagnosis Date  . Allergy   . Asthma   . Diabetes mellitus without complication ([place])   . Elevated liver enzymes   . Hepatic steatosis   . History of hepatitis B 1971   She had IV drug use experimentation and was hosptialized and reports treated.   . Hyperlipidemia   .  Hypertension    Past Surgical History:  Procedure Laterality Date  . ABDOMINAL HYSTERECTOMY    . BREAST BIOPSY    . CRANIECTOMY    . NASAL FRACTURE SURGERY  1975  . NASAL SINUS SURGERY  2010   Family History  Problem Relation Age of Onset  . Diabetes Mother   . Heart attack Mother   . Diabetes Father   . Heart disease Father   . Hypertension Father   . Diabetes Sister   . COPD Sister   . Heart disease Sister   . Diabetes Brother   . Heart attack Maternal Grandmother   . Cancer Maternal Grandfather   . Heart attack Paternal Grandmother   . Cancer Paternal Grandfather   . Heart attack Brother   . Heart disease Brother    Social History   Socioeconomic History  . Marital status: Married    Spouse name: Not on file  . Number of children: 2  . Years of education: Not on file  . Highest education level: Bachelor's degree (e.g., BA, AB, BS)  Occupational History  . Occupation: self employed  Tobacco Use  . Smoking status: Never Smoker  . Smokeless tobacco: Never Used  Vaping Use  . Vaping Use: Never used  Substance and Sexual Activity  . Alcohol use: Not Currently  . Drug use: Never  . Sexual activity: Not Currently    Partners: Male  Other Topics Concern  . Not on file  Social History Narrative  . Not on file   Social Determinants of Health   Financial Resource Strain: Medium Risk  . Difficulty of Paying Living Expenses: Somewhat hard  Food Insecurity: No Food Insecurity  . Worried About Charity fundraiser in the Last Year: Never true  . Ran Out of Food in the Last Year: Never true  Transportation Needs: No Transportation Needs  . Lack of Transportation (Medical): No  . Lack of Transportation (Non-Medical): No  Physical Activity: Inactive  . Days of Exercise per Week: 0 days  . Minutes of Exercise per Session: 0 min  Stress: Stress Concern Present  . Feeling of Stress : To some extent  Social Connections: Moderately Isolated  . Frequency of  Communication with Friends and Family: More than three times a week  . Frequency of Social Gatherings with Friends and Family: Never  . Attends Religious Services: Never  . Active Member of Clubs or Organizations: No  . Attends Archivist Meetings: Never  . Marital Status: Married    Tobacco Counseling Counseling given: Not Answered   Clinical Intake:  Pre-visit preparation completed: Yes  Pain : No/denies pain     Nutritional Risks: None Diabetes: Yes  How often do you need to have someone help you when you read instructions, pamphlets, or other written materials from your doctor or pharmacy?: 1 - Never  Diabetic? Yes  Nutrition Risk Assessment:  Has the patient had any N/V/D within the last 2 months?  No  Does the patient  have any non-healing wounds?  No  Has the patient had any unintentional weight loss or weight gain?  No   Diabetes:  Is the patient diabetic?  Yes  If diabetic, was a CBG obtained today?  No  Did the patient bring in their glucometer from home?  No  How often do you monitor your CBG's? Once a day.   Financial Strains and Diabetes Management:  Are you having any financial strains with the device, your supplies or your medication? No .  Does the patient want to be seen by Chronic Care Management for management of their diabetes?  No  Would the patient like to be referred to a Nutritionist or for Diabetic Management?  No   Diabetic Exams:  Diabetic Eye Exam: Completed 08/24/20 Diabetic Foot Exam: Overdue, Pt has been advised about the importance in completing this exam.    Interpreter Needed?: No  Information entered by :: [facility], LPN   Activities of Daily Living In your present state of health, do you have any difficulty performing the following activities: 11/29/2020 10/27/2020  Hearing? N N  Vision? N N  Difficulty concentrating or making decisions? N Y  Walking or climbing stairs? N N  Dressing or bathing? N N  Doing  errands, shopping? N N  Preparing Food and eating ? N -  Using the Toilet? N -  In the past six months, have you accidently leaked urine? N -  Do you have problems with loss of bowel control? N -  Managing your Medications? N -  Managing your Finances? N -  Housekeeping or managing your Housekeeping? N -  Some recent data might be hidden    Patient Care Team: [name], [name], FNP as PCP - General (Family Medicine) [name], MD as Consulting Physician (Ophthalmology) [name], MD as Consulting Physician (Neurology) [name], MD as Consulting Physician (Neurosurgery) [name], MD (Dermatology) [name], MD as Consulting Physician (Gastroenterology)  Indicate any recent Medical Services you may have received from other than [facility] providers in the past year (date may be approximate).     Assessment:   This is a routine wellness examination for Tiffany Gregory.  Hearing/Vision screen No exam data present  Dietary issues and exercise activities discussed: Current Exercise Habits: The patient does not participate in regular exercise at present, Exercise limited by: None identified  Goals      Patient Stated   .  Self Monitoring (pt-stated)      Patient Goals/Self-Care Activities . Over the next 90 days, patient will:  - take medications as prescribed check blood glucose BID, document, and provide at future appointments      Other   .  Prevent falls      Recommend to remove any items from the home that may cause slips or trips.      Depression Screen PHQ 2/9 Scores 11/29/2020 10/27/2020 06/14/2020  PHQ - 2 Score 1 2 0  PHQ- 9 Score 5 7 2     Fall Risk Fall Risk  11/29/2020 10/27/2020 06/14/2020  Falls in the past year? 1 1 1   Comment accidental fall - -  Number falls in past yr: 0 1 1  Injury with Fall? 1 1 1   Comment laceration due to being hit by a dog leash - -  Risk for fall due to : - - History of fall(s)  Follow up Falls  prevention discussed - Falls evaluation completed    [place]  TO THE HOME:  Any stairs in or around the home? Yes  If so, are there any without handrails? No  Home free of loose throw rugs in walkways, pet beds, electrical cords, etc? Yes  Adequate lighting in your home to reduce risk of falls? Yes   ASSISTIVE DEVICES UTILIZED TO PREVENT FALLS:  Life alert? No  Use of a cane, walker or w/c? No  Grab bars in the bathroom? No  Shower chair or bench in shower? Yes  Elevated toilet seat or a handicapped toilet? No    Cognitive Function: Normal cognitive status assessed by observation by this Nurse Health Advisor. No abnormalities found.          Immunizations Immunization History  Administered Date(s) Administered  . Fluad Quad(high Dose 65+) 07/22/2020  . Influenza-Unspecified 07/17/2018  . Moderna Sars-Covid-2 Vaccination 01/13/2020, 02/10/2020, 08/27/2020  . Pneumococcal Conjugate-13 05/15/2017  . Pneumococcal-Unspecified 10/08/2009    TDAP status: Due, Education has been provided regarding the importance of this vaccine. Advised may receive this vaccine at local pharmacy or Health Dept. Aware to provide a copy of the vaccination record if obtained from local pharmacy or Health Dept. Verbalized acceptance and understanding.  Flu Vaccine status: Up to date  Pneumococcal vaccine status: Due, Education has been provided regarding the importance of this vaccine. Advised may receive this vaccine at local pharmacy or Health Dept. Aware to provide a copy of the vaccination record if obtained from local pharmacy or Health Dept. Verbalized acceptance and understanding.  Covid-19 vaccine status: Completed vaccines  Qualifies for Shingles Vaccine? Yes   Zostavax completed No   Shingrix Completed?: No.    Education has been provided regarding the importance of this vaccine. Patient has been advised to call insurance company to determine out of pocket expense if  they have not yet received this vaccine. Advised may also receive vaccine at local pharmacy or Health Dept. Verbalized acceptance and understanding.  Screening Tests Health Maintenance  Topic Date Due  . FOOT EXAM  Never done  . COLONOSCOPY (Pts 45-70yr Insurance coverage will need to be confirmed)  Never done  . MAMMOGRAM  Never done  . DEXA SCAN  Never done  . PNA vac Low Risk Adult (2 of 2 - PPSV23) 05/15/2018  . TETANUS/TDAP  11/29/2021 (Originally 01/05/1970)  . COVID-19 Vaccine (4 - Booster for Moderna series) 02/24/2021  . HEMOGLOBIN A1C  04/26/2021  . OPHTHALMOLOGY EXAM  08/24/2021  . INFLUENZA VACCINE  Completed  . Hepatitis C Screening  Completed    Health Maintenance  Health Maintenance Due  Topic Date Due  . FOOT EXAM  Never done  . COLONOSCOPY (Pts 45-457yrInsurance coverage will need to be confirmed)  Never done  . MAMMOGRAM  Never done  . DEXA SCAN  Never done  . PNA vac Low Risk Adult (2 of 2 - PPSV23) 05/15/2018    Colorectal cancer screening: Currently due, declined referral or cologuard order at this time. Pt is in [facility] Dr [facility]) and she is aware of holding off on this at the time.   Mammogram status: Currently due, declined completing at this time.   Bone Density status: Currently due. Declined order at this time.   Lung Cancer Screening: (Low Dose CT Chest recommended if Age 69-80ears, 30 pack-year currently smoking OR have quit w/in 15years.) does not qualify.   Additional Screening:  Hepatitis C Screening: Up to date  Vision Screening: Recommended annual ophthalmology exams for early detection of glaucoma  and other disorders of the eye. Is the patient up to date with their annual eye exam?  Yes  Who is the provider or what is the name of the office in which the patient attends annual eye exams? Dr Edison Pace @ [facility] If pt is not established with a provider, would they like to be referred to a provider to establish care? No .   Dental  Screening: Recommended annual dental exams for proper oral hygiene  Community Resource Referral / Chronic Care Management: CRR required this visit?  No   CCM required this visit?  No      Plan:     I have personally reviewed and noted the following in the patient's chart:   . Medical and social history . Use of alcohol, tobacco or illicit drugs  . Current medications and supplements . Functional ability and status . Nutritional status . Physical activity . Advanced directives . List of other physicians . Hospitalizations, surgeries, and ER visits in previous 12 months . Vitals . Screenings to include cognitive, depression, and falls . Referrals and appointments  In addition, I have reviewed and discussed with patient certain preventive protocols, quality metrics, and best practice recommendations. A written personalized care plan for preventive services as well as general preventive health recommendations were provided to patient.     Tiffany Gregory [place], [place]   [birth year]   Nurse Notes: Pt needs a diabetic foot exam and Pneumovax 23 vaccine at next in office apt. Pt declined a colonoscopy referral, DEXA scan order or mammogram at this time.

## 2020-11-29 ENCOUNTER — Ambulatory Visit (INDEPENDENT_AMBULATORY_CARE_PROVIDER_SITE_OTHER): Payer: Medicare Other

## 2020-11-29 ENCOUNTER — Encounter: Payer: Self-pay | Admitting: Adult Health

## 2020-11-29 ENCOUNTER — Other Ambulatory Visit: Payer: Self-pay

## 2020-11-29 ENCOUNTER — Ambulatory Visit (INDEPENDENT_AMBULATORY_CARE_PROVIDER_SITE_OTHER): Payer: Medicare Other | Admitting: Adult Health

## 2020-11-29 VITALS — BP 132/74 | HR 82 | Temp 98.1°F | Resp 15 | Wt 163.4 lb

## 2020-11-29 DIAGNOSIS — I10 Essential (primary) hypertension: Secondary | ICD-10-CM

## 2020-11-29 DIAGNOSIS — E119 Type 2 diabetes mellitus without complications: Secondary | ICD-10-CM | POA: Diagnosis not present

## 2020-11-29 DIAGNOSIS — E785 Hyperlipidemia, unspecified: Secondary | ICD-10-CM | POA: Diagnosis not present

## 2020-11-29 DIAGNOSIS — K219 Gastro-esophageal reflux disease without esophagitis: Secondary | ICD-10-CM

## 2020-11-29 DIAGNOSIS — Z0000 Encounter for general adult medical examination without abnormal findings: Secondary | ICD-10-CM

## 2020-11-29 MED ORDER — OZEMPIC (0.25 OR 0.5 MG/DOSE) 2 MG/1.5ML ~~LOC~~ SOPN: 0.5000 mg | PEN_INJECTOR | SUBCUTANEOUS | 1 refills | Status: DC

## 2020-11-29 MED ORDER — RAMIPRIL 10 MG PO CAPS: 10.0000 mg | ORAL_CAPSULE | Freq: Two times a day (BID) | ORAL | 1 refills | Status: DC

## 2020-11-29 MED ORDER — ATORVASTATIN CALCIUM 20 MG PO TABS
20.0000 mg | ORAL_TABLET | Freq: Every day | ORAL | 1 refills | Status: DC
Start: 2020-11-29 — End: 2021-09-26

## 2020-11-29 MED ORDER — OMEPRAZOLE 20 MG PO CPDR
40.0000 mg | DELAYED_RELEASE_CAPSULE | Freq: Every day | ORAL | 0 refills | Status: DC
Start: 2020-11-29 — End: 2021-06-26

## 2020-11-29 MED ORDER — METFORMIN HCL ER 500 MG PO TB24
1000.0000 mg | ORAL_TABLET | Freq: Every day | ORAL | 0 refills | Status: DC
Start: 2020-11-29 — End: 2021-04-07

## 2020-11-29 NOTE — Patient Instructions (Signed)
Diabetes Mellitus and Nutrition, Adult When you have diabetes, or diabetes mellitus, it is very important to have healthy eating habits because your blood sugar (glucose) levels are greatly affected by what you eat and drink. Eating healthy foods in the right amounts, at about the same times every day, can help you:  Control your blood glucose.  Lower your risk of heart disease.  Improve your blood pressure.  Reach or maintain a healthy weight. What can affect my meal plan? Every person with diabetes is different, and each person has different needs for a meal plan. Your health care provider may recommend that you work with a dietitian to make a meal plan that is best for you. Your meal plan may vary depending on factors such as:  The calories you need.  The medicines you take.  Your weight.  Your blood glucose, blood pressure, and cholesterol levels.  Your activity level.  Other health conditions you have, such as heart or kidney disease. How do carbohydrates affect me? Carbohydrates, also called carbs, affect your blood glucose level more than any other type of food. Eating carbs naturally raises the amount of glucose in your blood. Carb counting is a method for keeping track of how many carbs you eat. Counting carbs is important to keep your blood glucose at a healthy level, especially if you use insulin or take certain oral diabetes medicines. It is important to know how many carbs you can safely have in each meal. This is different for every person. Your dietitian can help you calculate how many carbs you should have at each meal and for each snack. How does alcohol affect me? Alcohol can cause a sudden decrease in blood glucose (hypoglycemia), especially if you use insulin or take certain oral diabetes medicines. Hypoglycemia can be a life-threatening condition. Symptoms of hypoglycemia, such as sleepiness, dizziness, and confusion, are similar to symptoms of having too much  alcohol.  Do not drink alcohol if: ? Your health care provider tells you not to drink. ? You are pregnant, may be pregnant, or are planning to become pregnant.  If you drink alcohol: ? Do not drink on an empty stomach. ? Limit how much you use to:  0-1 drink a day for women.  0-2 drinks a day for men. ? Be aware of how much alcohol is in your drink. In the [place] one drink equals one 12 oz bottle of beer (355 mL), one 5 oz glass of wine (148 mL), or one 1 oz glass of hard liquor (44 mL). ? Keep yourself hydrated with water, diet soda, or unsweetened iced tea.  Keep in mind that regular soda, juice, and other mixers may contain a lot of sugar and must be counted as carbs. What are tips for following this plan? Reading food labels  Start by checking the serving size on the "Nutrition Facts" label of packaged foods and drinks. The amount of calories, carbs, fats, and other nutrients listed on the label is based on one serving of the item. Many items contain more than one serving per package.  Check the total grams (g) of carbs in one serving. You can calculate the number of servings of carbs in one serving by dividing the total carbs by 15. For example, if a food has 30 g of total carbs per serving, it would be equal to 2 servings of carbs.  Check the number of grams (g) of saturated fats and trans fats in one serving. Choose foods that have   a low amount or none of these fats.  Check the number of milligrams (mg) of salt (sodium) in one serving. Most people should limit total sodium intake to less than 2,300 mg per day.  Always check the nutrition information of foods labeled as "low-fat" or "nonfat." These foods may be higher in added sugar or refined carbs and should be avoided.  Talk to your dietitian to identify your daily goals for nutrients listed on the label. Shopping  Avoid buying canned, pre-made, or processed foods. These foods tend to be high in fat, sodium, and added  sugar.  Shop around the outside edge of the grocery store. This is where you will most often find fresh fruits and vegetables, bulk grains, fresh meats, and fresh dairy. Cooking  Use low-heat cooking methods, such as baking, instead of high-heat cooking methods like deep frying.  Cook using healthy oils, such as olive, canola, or sunflower oil.  Avoid cooking with butter, cream, or high-fat meats. Meal planning  Eat meals and snacks regularly, preferably at the same times every day. Avoid going long periods of time without eating.  Eat foods that are high in fiber, such as fresh fruits, vegetables, beans, and whole grains. Talk with your dietitian about how many servings of carbs you can eat at each meal.  Eat 4-6 oz (112-168 g) of lean protein each day, such as lean meat, chicken, fish, eggs, or tofu. One ounce (oz) of lean protein is equal to: ? 1 oz (28 g) of meat, chicken, or fish. ? 1 egg. ?  cup (62 g) of tofu.  Eat some foods each day that contain healthy fats, such as avocado, nuts, seeds, and fish.   What foods should I eat? Fruits Berries. Apples. Oranges. Peaches. Apricots. Plums. Grapes. Mango. Papaya. Pomegranate. Kiwi. Cherries. Vegetables Lettuce. Spinach. Leafy greens, including kale, chard, collard greens, and mustard greens. Beets. Cauliflower. Cabbage. Broccoli. Carrots. Green beans. Tomatoes. Peppers. Onions. Cucumbers. Brussels sprouts. Grains Whole grains, such as whole-wheat or whole-grain bread, crackers, tortillas, cereal, and pasta. Unsweetened oatmeal. Quinoa. Brown or wild rice. Meats and other proteins Seafood. Poultry without skin. Lean cuts of poultry and beef. Tofu. Nuts. Seeds. Dairy Low-fat or fat-free dairy products such as milk, yogurt, and cheese. The items listed above may not be a complete list of foods and beverages you can eat. Contact a dietitian for more information. What foods should I avoid? Fruits Fruits canned with  syrup. Vegetables Canned vegetables. Frozen vegetables with butter or cream sauce. Grains Refined white flour and flour products such as bread, pasta, snack foods, and cereals. Avoid all processed foods. Meats and other proteins Fatty cuts of meat. Poultry with skin. Breaded or fried meats. Processed meat. Avoid saturated fats. Dairy Full-fat yogurt, cheese, or milk. Beverages Sweetened drinks, such as soda or iced tea. The items listed above may not be a complete list of foods and beverages you should avoid. Contact a dietitian for more information. Questions to ask a health care provider  Do I need to meet with a diabetes educator?  Do I need to meet with a dietitian?  What number can I call if I have questions?  When are the best times to check my blood glucose? Where to find more information:  American Diabetes Association: diabetes.org  Academy of Nutrition and Dietetics: www.eatright.org  National Institute of Diabetes and Digestive and Kidney Diseases: www.niddk.nih.gov  Association of Diabetes Care and Education Specialists: www.diabeteseducator.org Summary  It is important to have healthy eating   habits because your blood sugar (glucose) levels are greatly affected by what you eat and drink.  A healthy meal plan will help you control your blood glucose and maintain a healthy lifestyle.  Your health care provider may recommend that you work with a dietitian to make a meal plan that is best for you.  Keep in mind that carbohydrates (carbs) and alcohol have immediate effects on your blood glucose levels. It is important to count carbs and to use alcohol carefully. This information is not intended to replace advice given to you by your health care provider. Make sure you discuss any questions you have with your health care provider. Document Revised: 09/01/2019 Document Reviewed: 09/01/2019 Elsevier Patient Education  2021 Else[employer] Inc.  

## 2020-11-29 NOTE — Patient Instructions (Signed)
Tiffany Gregory , Thank you for taking time to come for your Medicare Wellness Visit. I appreciate your ongoing commitment to your health goals. Please review the following plan we discussed and let me know if I can assist you in the future.   Screening recommendations/referrals: Colonoscopy: Currently due, declined referral at this time.  Mammogram: Currently due, declined order at this time.  Bone Density: Currently due, declined order at this time.  Recommended yearly ophthalmology/optometry visit for glaucoma screening and checkup Recommended yearly dental visit for hygiene and checkup  Vaccinations: Influenza vaccine: Done 07/22/20 Pneumococcal vaccine: Pneumovax 23 due at next in office apt.  Tdap vaccine: Currently due, declined receiving.  Shingles vaccine: Shingrix discussed. Please contact your pharmacy for coverage information.     Advanced directives: Please bring a copy of your POA (Power of Attorney) and/or Living Will to your next appointment.   Conditions/risks identified: Fall risk preventatives discussed today.   Next appointment: None, declined scheduling a follow up with PCP or an AWV for 2023 at this time.    Preventive Care 69 Years and Older, Female Preventive care refers to lifestyle choices and visits with your health care provider that can promote health and wellness. What does preventive care include?  A yearly physical exam. This is also called an annual well check.  Dental exams once or twice a year.  Routine eye exams. Ask your health care provider how often you should have your eyes checked.  Personal lifestyle choices, including:  Daily care of your teeth and gums.  Regular physical activity.  Eating a healthy diet.  Avoiding tobacco and drug use.  Limiting alcohol use.  Practicing safe sex.  Taking low-dose aspirin every day.  Taking vitamin and mineral supplements as recommended by your health care provider. What happens during an  annual well check? The services and screenings done by your health care provider during your annual well check will depend on your age, overall health, lifestyle risk factors, and family history of disease. Counseling  Your health care provider may ask you questions about your:  Alcohol use.  Tobacco use.  Drug use.  Emotional well-being.  Home and relationship well-being.  Sexual activity.  Eating habits.  History of falls.  Memory and ability to understand (cognition).  Work and work Statistician.  Reproductive health. Screening  You may have the following tests or measurements:  Height, weight, and BMI.  Blood pressure.  Lipid and cholesterol levels. These may be checked every 5 years, or more frequently if you are over 11 years old.  Skin check.  Lung cancer screening. You may have this screening every year starting at age 69 if you have a 30-pack-year history of smoking and currently smoke or have quit within the past 15 years.  Fecal occult blood test (FOBT) of the stool. You may have this test every year starting at age 13.  Flexible sigmoidoscopy or colonoscopy. You may have a sigmoidoscopy every 5 years or a colonoscopy every 10 years starting at age 69.  Hepatitis C blood test.  Hepatitis B blood test.  Sexually transmitted disease (STD) testing.  Diabetes screening. This is done by checking your blood sugar (glucose) after you have not eaten for a while (fasting). You may have this done every 1-3 years.  Bone density scan. This is done to screen for osteoporosis. You may have this done starting at age 69.  Mammogram. This may be done every 1-2 years. Talk to your health care provider about how  often you should have regular mammograms. Talk with your health care provider about your test results, treatment options, and if necessary, the need for more tests. Vaccines  Your health care provider may recommend certain vaccines, such as:  Influenza  vaccine. This is recommended every year.  Tetanus, diphtheria, and acellular pertussis (Tdap, Td) vaccine. You may need a Td booster every 10 years.  Zoster vaccine. You may need this after age 69.  Pneumococcal 13-valent conjugate (PCV13) vaccine. One dose is recommended after age 40.  Pneumococcal polysaccharide (PPSV23) vaccine. One dose is recommended after age 40. Talk to your health care provider about which screenings and vaccines you need and how often you need them. This information is not intended to replace advice given to you by your health care provider. Make sure you discuss any questions you have with your health care provider. Document Released: 10/21/2015 Document Revised: 06/13/2016 Document Reviewed: 07/26/2015 Elsevier Interactive Patient Education  2017 [place] Prevention in the Home Falls can cause injuries. They can happen to people of all ages. There are many things you can do to make your home safe and to help prevent falls. What can I do on the outside of my home?  Regularly fix the edges of walkways and driveways and fix any cracks.  Remove anything that might make you trip as you walk through a door, such as a raised step or threshold.  Trim any bushes or trees on the path to your home.  Use bright outdoor lighting.  Clear any walking paths of anything that might make someone trip, such as rocks or tools.  Regularly check to see if handrails are loose or broken. Make sure that both sides of any steps have handrails.  Any raised decks and porches should have guardrails on the edges.  Have any leaves, snow, or ice cleared regularly.  Use sand or salt on walking paths during winter.  Clean up any spills in your garage right away. This includes oil or grease spills. What can I do in the bathroom?  Use night lights.  Install grab bars by the toilet and in the tub and shower. Do not use towel bars as grab bars.  Use non-skid mats or decals  in the tub or shower.  If you need to sit down in the shower, use a plastic, non-slip stool.  Keep the floor dry. Clean up any water that spills on the floor as soon as it happens.  Remove soap buildup in the tub or shower regularly.  Attach bath mats securely with double-sided non-slip rug tape.  Do not have throw rugs and other things on the floor that can make you trip. What can I do in the bedroom?  Use night lights.  Make sure that you have a light by your bed that is easy to reach.  Do not use any sheets or blankets that are too big for your bed. They should not hang down onto the floor.  Have a firm chair that has side arms. You can use this for support while you get dressed.  Do not have throw rugs and other things on the floor that can make you trip. What can I do in the kitchen?  Clean up any spills right away.  Avoid walking on wet floors.  Keep items that you use a lot in easy-to-reach places.  If you need to reach something above you, use a strong step stool that has a grab bar.  Keep electrical  cords out of the way.  Do not use floor polish or wax that makes floors slippery. If you must use wax, use non-skid floor wax.  Do not have throw rugs and other things on the floor that can make you trip. What can I do with my stairs?  Do not leave any items on the stairs.  Make sure that there are handrails on both sides of the stairs and use them. Fix handrails that are broken or loose. Make sure that handrails are as long as the stairways.  Check any carpeting to make sure that it is firmly attached to the stairs. Fix any carpet that is loose or worn.  Avoid having throw rugs at the top or bottom of the stairs. If you do have throw rugs, attach them to the floor with carpet tape.  Make sure that you have a light switch at the top of the stairs and the bottom of the stairs. If you do not have them, ask someone to add them for you. What else can I do to help  prevent falls?  Wear shoes that:  Do not have high heels.  Have rubber bottoms.  Are comfortable and fit you well.  Are closed at the toe. Do not wear sandals.  If you use a stepladder:  Make sure that it is fully opened. Do not climb a closed stepladder.  Make sure that both sides of the stepladder are locked into place.  Ask someone to hold it for you, if possible.  Clearly mark and make sure that you can see:  Any grab bars or handrails.  First and last steps.  Where the edge of each step is.  Use tools that help you move around (mobility aids) if they are needed. These include:  Canes.  Walkers.  Scooters.  Crutches.  Turn on the lights when you go into a dark area. Replace any light bulbs as soon as they burn out.  Set up your furniture so you have a clear path. Avoid moving your furniture around.  If any of your floors are uneven, fix them.  If there are any pets around you, be aware of where they are.  Review your medicines with your doctor. Some medicines can make you feel dizzy. This can increase your chance of falling. Ask your doctor what other things that you can do to help prevent falls. This information is not intended to replace advice given to you by your health care provider. Make sure you discuss any questions you have with your health care provider. Document Released: 07/21/2009 Document Revised: 03/01/2016 Document Reviewed: 10/29/2014 Elsevier Interactive Patient Education  2017 [employer].

## 2020-11-29 NOTE — Progress Notes (Signed)
Established patient visit   Patient: Tiffany Gregory   DOB: 12-30-1950   70 y.o. Female  MRN: [id number] Visit Date: 11/29/2020  Today's healthcare provider: Marcille Buffy, FNP   Chief Complaint  Patient presents with   Follow-up   Subjective    HPI  Follow up for Memory Loss  The patient was last seen for this 1 months ago. Changes made at last visit include MRI of brain ordered (No evidence of acute intracranial abnormality)  and patient to follow up with neurology ( who ordered labs and refer for neurocognitive testing) . Patient reports she has a scheduled EEG in one week and was started on Vitamin B12 1000 mcg qd. Patient has a scheduled appointment she states with neurosurgeon this week She feels that condition is Unchanged. She is not having side effects.   ----------------------------------------------------------------------------------------- Diabetes Mellitus Type II, Follow-up  She has been prescribed Metformin XR 1000 mg BID, in times past, she reprts to me she has been only taking this once daily after speaking with pharmacist.  Lab Results  Component Value Date   HGBA1C 7.6 (H) 10/27/2020   HGBA1C 8.8 (H) 06/14/2020   Wt Readings from Last 3 Encounters:  11/29/20 163 lb 6.4 oz (74.1 kg)  10/27/20 162 lb 6.4 oz (73.7 kg)  10/19/20 159 lb (72.1 kg)   Last seen for diabetes 1 months ago.  Management since then includes continuing patient on Metformin 552m BID.Patient states that pharmacist previously to only take once a day.  She reports excellent compliance with treatment. She is not having side effects.  Symptoms: Yes fatigue No foot ulcerations  Yes appetite changes Yes nausea  No paresthesia of the feet  No polydipsia  No polyuria No visual disturbances   No vomiting     Home blood sugar records: 106-152  Episodes of hypoglycemia? No    Current insulin regiment: Ozempic 0.580mweekly Most Recent Eye Exam: within the past 12  months Current exercise: not asked Current diet habits: well balanced  She has seen neurology, for memory loss. She also has appointment this Thursday for neurosurgeon to follow up on shunt( see last note)   Patient  denies any fever, body aches,chills, rash, chest pain, shortness of breath, nausea, vomiting, or diarrhea. Denies dizziness, lightheadedness, pre syncopal or   She is going for colonoscopy and endoscopy. She has seen GI.  VaVirgel ManifoldMD  10/27/2020 3:16 PM EST Back to Top     Discuss results with patient on next visit and see if she would be interested in hep B vaccine series given that her core antibody has been the only thing that is positive and every other dose for hep B, including E antigen, E antibody, surface antibody, surface antigen have been negative dating back to 2019  As per up todate " we administer the full series of the hepatitis B vaccine to patients with isolated anti-HBc who are from low endemic areas and have no risk factors for HBV, given the possibility of a false-positive result". As per immunize.org    [facility] Pertinent Labs: Lab Results  Component Value Date   CHOL 139 10/27/2020   HDL 32 (L) 10/27/2020   LDLCALC 74 10/27/2020   LDLDIRECT 109.0 06/14/2020   TRIG 192 (H) 10/27/2020   CHOLHDL 5 06/14/2020   Lab Results  Component Value Date   NA 138 10/27/2020   K 4.1 10/27/2020   CREATININE 0.76 10/27/2020   GFRNONAA 81 10/27/2020  GFRAA 93 10/27/2020   GLUCOSE 137 (H) 10/27/2020     --------------------------------------------------------------------------------------------------- Patient Active Problem List   Diagnosis Date Noted   Gastroesophageal reflux disease 11/29/2020   Memory loss 10/27/2020   History of craniotomy 10/27/2020   Family history of GERD 10/27/2020   Hyperlipidemia 06/22/2020   Elevated liver enzymes 06/22/2020   History of hepatitis B virus infection 06/22/2020   Overweight with body mass index  (BMI) of 27 to 27.9 in adult 06/22/2020   Hepatic steatosis 06/15/2020   Encounter for medical examination to establish care 06/14/2020   Type 2 diabetes mellitus without complication, without long-term current use of insulin ([place]) 06/14/2020   Essential hypertension 06/14/2020   Skin lesion 06/14/2020   Past Medical History:  Diagnosis Date   Allergy    Asthma    Diabetes mellitus without complication (HCC)    Elevated liver enzymes    Hepatic steatosis    History of hepatitis B 1971   She had IV drug use experimentation and was hosptialized and reports treated.    Hyperlipidemia    Hypertension    Allergies  Allergen Reactions   [name] Other (See Comments)   Sulfa Antibiotics Other (See Comments)       Medications: Outpatient Medications Prior to Visit  Medication Sig Note   aspirin EC 81 MG tablet Take 81 mg by mouth daily. Swallow whole.    Blood Glucose Monitoring Suppl (ONE TOUCH ULTRA 2) w/Device KIT by Does not apply route.    glucose blood test strip 1 each by Other route as needed for other. Use as instructed. One touch ultra blue in vitro strips    OneTouch Delica Lancets 84O MISC by Does not apply route.    polyethylene glycol (MIRALAX / GLYCOLAX) 17 g packet Take 17 g by mouth daily.    [DISCONTINUED] atorvastatin (LIPITOR) 20 MG tablet Take 1 tablet (20 mg total) by mouth daily.    [DISCONTINUED] metFORMIN (GLUCOPHAGE XR) 500 MG 24 hr tablet Take 1000 mg twice daily with meals. 11/29/2020: Patient reports that she is only taking once a day.    [DISCONTINUED] omeprazole (PRILOSEC) 20 MG capsule Take 1 capsule (20 mg total) by mouth daily.    [DISCONTINUED] ramipril (ALTACE) 10 MG capsule Take 1 capsule (10 mg total) by mouth 2 (two) times daily.    [DISCONTINUED] Semaglutide,0.25 or 0.5MG/DOS, (OZEMPIC, 0.25 OR 0.5 MG/DOSE,) 2 MG/1.5ML SOPN INJECT 0.25 MG INTO THE SKIN ONCE A WEEK FOR 4 WEEKS, THEN INCREASE TO 0.5MG WEEKLY     [DISCONTINUED] doxycycline (MONODOX) 100 MG capsule Take 1 capsule (100 mg total) by mouth 2 (two) times daily. Take with food and drink    [DISCONTINUED] mupirocin ointment (BACTROBAN) 2 % Apply 1 application topically 2 (two) times daily. Bid to wound R axilla    No facility-administered medications prior to visit.    Review of Systems  Constitutional: Positive for fatigue. Negative for activity change, appetite change, chills, diaphoresis, fever and unexpected weight change.  HENT: Negative.   Respiratory: Negative.   Cardiovascular: Negative.   Genitourinary: Negative.   Neurological: Negative.   Psychiatric/Behavioral: Positive for decreased concentration.    Last CBC Lab Results  Component Value Date   WBC 8.3 10/27/2020   HGB 13.0 10/27/2020   HCT 40.4 10/27/2020   MCV 84 10/27/2020   MCH 27.1 10/27/2020   RDW 13.2 10/27/2020   PLT 324 96/29/5284   Last metabolic panel Lab Results  Component Value Date   GLUCOSE  137 (H) 10/27/2020   NA 138 10/27/2020   K 4.1 10/27/2020   CL 99 10/27/2020   CO2 25 10/27/2020   BUN 12 10/27/2020   CREATININE 0.76 10/27/2020   GFRNONAA 81 10/27/2020   GFRAA 93 10/27/2020   CALCIUM 9.7 10/27/2020   PROT 8.0 10/27/2020   ALBUMIN 4.8 10/27/2020   LABGLOB 3.2 10/27/2020   AGRATIO 1.5 10/27/2020   BILITOT 0.3 10/27/2020   ALKPHOS 84 10/27/2020   AST 33 10/27/2020   ALT 32 10/27/2020   ANIONGAP 9 06/20/2020   Last lipids Lab Results  Component Value Date   CHOL 139 10/27/2020   HDL 32 (L) 10/27/2020   LDLCALC 74 10/27/2020   LDLDIRECT 109.0 06/14/2020   TRIG 192 (H) 10/27/2020   CHOLHDL 5 06/14/2020   Last hemoglobin A1c Lab Results  Component Value Date   HGBA1C 7.6 (H) 10/27/2020   Last thyroid functions Lab Results  Component Value Date   TSH 1.920 10/27/2020   Last vitamin D Lab Results  Component Value Date   VD25OH 21.95 (L) 06/14/2020   Last vitamin B12 and Folate Lab Results  Component Value Date    VITAMINB12 368 10/27/2020   FOLATE 3.4 10/27/2020       Objective    BP 132/74    Pulse 82    Temp 98.1 F (36.7 C) (Oral)    Resp 15    Wt 163 lb 6.4 oz (74.1 kg)    SpO2 100%    BMI 27.19 kg/m  BP Readings from Last 3 Encounters:  11/29/20 132/74  10/27/20 (!) 144/72  10/19/20 134/63   Wt Readings from Last 3 Encounters:  11/29/20 163 lb 6.4 oz (74.1 kg)  10/27/20 162 lb 6.4 oz (73.7 kg)  10/19/20 159 lb (72.1 kg)       Physical Exam Vitals reviewed.  Constitutional:      General: She is not in acute distress.    Appearance: Normal appearance. She is not ill-appearing, toxic-appearing or diaphoretic.  HENT:     Head: Normocephalic and atraumatic.     Right Ear: External ear normal.     Left Ear: External ear normal.     Nose: Nose normal.     Mouth/Throat:     Pharynx: Oropharynx is clear.  Eyes:     General: No scleral icterus.       Right eye: No discharge.        Left eye: No discharge.     Extraocular Movements: Extraocular movements intact.     Conjunctiva/sclera: Conjunctivae normal.     Pupils: Pupils are equal, round, and reactive to light.  Cardiovascular:     Rate and Rhythm: Normal rate and regular rhythm.     Pulses: Normal pulses.     Heart sounds: Normal heart sounds. No murmur heard. No friction rub. No gallop.   Pulmonary:     Effort: Pulmonary effort is normal. No respiratory distress.     Breath sounds: Normal breath sounds. No stridor. No wheezing, rhonchi or rales.  Chest:     Chest wall: No tenderness.  Abdominal:     General: There is no distension.     Palpations: Abdomen is soft.     Tenderness: There is no abdominal tenderness.  Musculoskeletal:        General: Normal range of motion.     Cervical back: Normal range of motion and neck supple.  Skin:    General: Skin is warm.  Findings: No erythema or rash.  Neurological:     Mental Status: She is alert and oriented to person, place, and time.     Motor: No weakness.      Gait: Gait normal.  Psychiatric:        Mood and Affect: Mood normal.        Behavior: Behavior normal.        Thought Content: Thought content normal.        Judgment: Judgment normal.      No results found for any visits on 11/29/20.  Assessment & Plan     1. Type 2 diabetes mellitus without complication, without long-term current use of insulin (HCC) Diet and lifestyle changes. She has been taking Metformin as below.  Will continue.  - metFORMIN (GLUCOPHAGE XR) 500 MG 24 hr tablet; Take 2 tablets (1,000 mg total) by mouth daily with breakfast.  Dispense: 180 tablet; Refill: 0  2. Gastroesophageal reflux disease, unspecified whether esophagitis present  - omeprazole (PRILOSEC) 20 MG capsule; Take 2 capsules (40 mg total) by mouth daily.  Dispense: 180 capsule; Refill: 0  3. Hyperlipidemia, unspecified hyperlipidemia type  - atorvastatin (LIPITOR) 20 MG tablet; Take 1 tablet (20 mg total) by mouth daily.  Dispense: 90 tablet; Refill: 1  4. Essential hypertension Monitor blood pressures at home. Diet and lifestyle changes. Recheck in office 3 months.  - ramipril (ALTACE) 10 MG capsule; Take 1 capsule (10 mg total) by mouth 2 (two) times daily.  Dispense: 180 capsule; Refill: 1  Meds ordered this encounter  Medications   metFORMIN (GLUCOPHAGE XR) 500 MG 24 hr tablet    Sig: Take 2 tablets (1,000 mg total) by mouth daily with breakfast.    Dispense:  180 tablet    Refill:  0   omeprazole (PRILOSEC) 20 MG capsule    Sig: Take 2 capsules (40 mg total) by mouth daily.    Dispense:  180 capsule    Refill:  0   Semaglutide,0.25 or 0.5MG/DOS, (OZEMPIC, 0.25 OR 0.5 MG/DOSE,) 2 MG/1.5ML SOPN    Sig: Inject 0.5 mg into the skin once a week. INJECT 0.25 MG INTO THE SKIN ONCE A WEEK FOR 4 WEEKS, THEN INCREASE TO 0.5MG WEEKLY    Dispense:  1.5 mL    Refill:  1   atorvastatin (LIPITOR) 20 MG tablet    Sig: Take 1 tablet (20 mg total) by mouth daily.    Dispense:  90 tablet     Refill:  1   ramipril (ALTACE) 10 MG capsule    Sig: Take 1 capsule (10 mg total) by mouth 2 (two) times daily.    Dispense:  180 capsule    Refill:  1    No orders of the defined types were placed in this encounter.   Red Flags discussed. The patient was given clear instructions to go to ER or return to medical center if any red flags develop, symptoms do not improve, worsen or new problems develop. They verbalized understanding.  Return in about 3 months (around 02/26/2021), or if symptoms worsen or fail to improve, for at any time for any worsening symptoms, Go to Emergency room/ urgent care if worse.     The entirety of the information documented in the History of Present Illness, Review of Systems and Physical Exam were personally obtained by me. Portions of this information were initially documented by the CMA and reviewed by me for thoroughness and accuracy.      Sharyn Lull  Rolinda Roan, [place] [phone] (phone) [phone] (fax)  [place]

## 2020-12-15 ENCOUNTER — Ambulatory Visit: Payer: Medicare Other | Admitting: Dermatology

## 2020-12-15 ENCOUNTER — Other Ambulatory Visit: Payer: Self-pay

## 2020-12-15 DIAGNOSIS — Z8614 Personal history of Methicillin resistant Staphylococcus aureus infection: Secondary | ICD-10-CM | POA: Diagnosis not present

## 2020-12-15 DIAGNOSIS — L02411 Cutaneous abscess of right axilla: Secondary | ICD-10-CM

## 2020-12-15 DIAGNOSIS — L821 Other seborrheic keratosis: Secondary | ICD-10-CM | POA: Diagnosis not present

## 2020-12-15 DIAGNOSIS — L0291 Cutaneous abscess, unspecified: Secondary | ICD-10-CM

## 2020-12-15 MED ORDER — DOXYCYCLINE MONOHYDRATE 100 MG PO CAPS: 100.0000 mg | ORAL_CAPSULE | Freq: Two times a day (BID) | ORAL | 1 refills | Status: DC

## 2020-12-15 NOTE — Progress Notes (Signed)
   Follow-Up Visit   Subjective  Tiffany Gregory is a 70 y.o. female who presents for the following: Follow-up (Abscess follow up of right axilla with + MRSA. I&D 11/02/2020 and she took doxycycline).  She also asks about brown spots.  The following portions of the chart were reviewed this encounter and updated as appropriate:   Tobacco  Allergies  Meds  Problems  Med Hx  Surg Hx  Fam Hx     Review of Systems:  No other skin or systemic complaints except as noted in HPI or Assessment and Plan.  Objective  Well appearing patient in no apparent distress; mood and affect are within normal limits.  A focused examination was performed including right axilla. Relevant physical exam findings are noted in the Assessment and Plan.  Objective  Right Axilla: Clear today   Assessment & Plan  Abscess with culture proven MRSA Right Axilla Abscess is resolved. Advised the potential for recurrence and new areas involved.  Plan to restart Doxycycline 100 mg and Mupirocin oint with any flares. RTC if needed for I&D  doxycycline (MONODOX) 100 MG capsule - Right Axilla  Seborrheic Keratoses - Stuck-on, waxy, tan-brown papules and plaques  - Discussed benign etiology and prognosis. - Observe - Call for any changes  Return if symptoms worsen or fail to improve.    I, Ashok Cordia, CMA, am acting as scribe for Sarina Ser, MD .  Documentation: I have reviewed the above documentation for accuracy and completeness, and I agree with the above.  Sarina Ser, MD

## 2020-12-17 ENCOUNTER — Telehealth: Payer: Self-pay | Admitting: Internal Medicine

## 2020-12-17 NOTE — Telephone Encounter (Signed)
I received notification that Tiffany Gregory is overdue her mammogram.  I am forwarding this message to you since you are listed as her PCP.  Thank you.   Einar Pheasant

## 2020-12-17 NOTE — Telephone Encounter (Signed)
Thank you she declined screening at this time will readdress next  visit. Thank you for forwarding.   Thank you,  [name] MSN, AGNP-C, FNP-C  Family Nurse Practitioner  Adult Geriatric Nurse Practitioner     Screening recommendations/referrals: Colonoscopy: Currently due, declined referral at this time.  Mammogram: Currently due, declined order at this time.  Bone Density: Currently due, declined order at this time.  Recommended yearly ophthalmology/optometry visit for glaucoma screening and checkup Recommended yearly dental visit for hygiene and checkup   Vaccinations: Influenza vaccine: Done 07/22/20 Pneumococcal vaccine: Pneumovax 23 due at next in office apt.  Tdap vaccine: Currently due, declined receiving.  Shingles vaccine: Shingrix discussed. Please contact your pharmacy for coverage information.

## 2020-12-27 ENCOUNTER — Encounter: Payer: Self-pay | Admitting: Dermatology

## 2021-01-10 ENCOUNTER — Encounter: Payer: Self-pay | Admitting: Gastroenterology

## 2021-01-10 ENCOUNTER — Ambulatory Visit: Payer: Medicare Other | Admitting: Gastroenterology

## 2021-01-10 ENCOUNTER — Other Ambulatory Visit: Payer: Self-pay

## 2021-01-10 VITALS — BP 143/78 | HR 78 | Temp 98.3°F | Ht 65.0 in | Wt 161.0 lb

## 2021-01-10 DIAGNOSIS — K219 Gastro-esophageal reflux disease without esophagitis: Secondary | ICD-10-CM

## 2021-01-10 DIAGNOSIS — B1910 Unspecified viral hepatitis B without hepatic coma: Secondary | ICD-10-CM | POA: Diagnosis not present

## 2021-01-10 NOTE — Progress Notes (Signed)
Tiffany Antigua, MD [address]  [place]  [place],  [address]  Main: [phone]  Fax: [phone]   Primary Care Physician: [name], FNP   Chief Complaint  Patient presents with  . Elevated Hepatic Enzymes    HPI: Tiffany Gregory is a 70 y.o. female here for follow-up of elevated liver enzymes.Most recent motor enzymes are normal.  Patient also has history of fatty liver, and isolated hep B core antibody positive chronically.  Patient also reports chronic GERD, and has been taking Prilosec and reports indigestion only when she eats foods known to cause her symptoms .  No recent upper endoscopy  Previous history:  Patient reports in 2003 at 2004 she had an episode of food impaction and underwent upper endoscopy with removal of food bolus.  She was told she has a tortuous esophagus and needs to be on omeprazole chronically.  Since then she has been on omeprazole with no further dysphagia or heartburn.  No other upper endoscopy since then  Has also had 3 colonoscopies in her lifetime and no polyps were found on any one of them, last one being in 2012.  No family history of colon cancer.  In regard to her hepatitis B results:  "her core antibody has been the only thing that is positive and every other dose for hep B, including E antigen, E antibody, surface antibody, surface antigen have been negative dating back to 2019  As per up todate " we administer the full series of the hepatitis B vaccine to patients with isolated anti-HBc who are from low endemic areas and have no risk factors for HBV, given the possibility of a false-positive result". As per immunize.org  Isolated anti-HBc - The isolated presence of antibody to hepatitis B core antigen (anti-HBc) in the absence of HBsAg and anti-HBs has been reported in 0.4 to 1.7 percent of blood donors in low prevalence areas and in 10 to 20 percent of the population in endemic countries [21]. Isolated  detection of anti-HBc can occur in several settings:  ?During the window period of acute hepatitis B when the anti-HBc is predominantly IgM class. (See 'Acute hepatitis' below.)  ?When anti-HBs has fallen to undetectable levels many years after recovery from acute hepatitis B. (See 'Hepatitis B surface antigen and antibody' above.)  ?When the HBsAg titer has fallen below the cutoff level for detection in those with chronic hepatitis B (approximately 0.5 percent per year [15]). (See 'Hepatitis B surface antigen and antibody' above and 'Occult HBV infection' below.)  ?In the setting of HBsAg mutations where testing leads to false-negative HBsAg results. This occurs when monoclonal instead of polyclonal hepatitis B surface antibodies are used in enzyme immunoassays for capture and/or detection of HBsAg [22,23]. (See "Hepatitis B virus immunization in adults", section on 'Vaccine-induced HBV S escape mutants'.)  ?In some cases, isolated anti-HBc may be due to a false-positive test result. This is most likely seen in patients who are at low risk for disease. In the late 1980s, it was suggested that as many as 24 to 35 percent of persons with isolated anti-HBc had false-positive test results, based upon studies that demonstrated the development of a primary anti-HBs response to hepatitis B vaccination of asymptomatic individuals with isolated anti-HBc [21,24]. However, improvements of enzyme immunoassays in the past decade have decreased the rate of false-positive results."  Current Outpatient Medications  Medication Sig Dispense Refill  . aspirin EC 81 MG tablet Take 81 mg by mouth daily.  Swallow whole.    Marland Kitchen atorvastatin (LIPITOR) 20 MG tablet Take 1 tablet (20 mg total) by mouth daily. 90 tablet 1  . Blood Glucose Monitoring Suppl (ONE TOUCH ULTRA 2) w/Device KIT by Does not apply route.    . cyanocobalamin 1000 MCG tablet Take 1 tablet by mouth daily.    Marland Kitchen glucose blood test strip 1 each by Other  route as needed for other. Use as instructed. One touch ultra blue in vitro strips 100 each 3  . metFORMIN (GLUCOPHAGE XR) 500 MG 24 hr tablet Take 2 tablets (1,000 mg total) by mouth daily with breakfast. 180 tablet 0  . omeprazole (PRILOSEC) 20 MG capsule Take 2 capsules (40 mg total) by mouth daily. 180 capsule 0  . OneTouch Delica Lancets 45W MISC by Does not apply route.    . polyethylene glycol (MIRALAX / GLYCOLAX) 17 g packet Take 17 g by mouth daily.    . ramipril (ALTACE) 10 MG capsule Take 1 capsule (10 mg total) by mouth 2 (two) times daily. 180 capsule 1  . Semaglutide,0.25 or 0.5MG/DOS, (OZEMPIC, 0.25 OR 0.5 MG/DOSE,) 2 MG/1.5ML SOPN Inject 0.5 mg into the skin once a week. INJECT 0.25 MG INTO THE SKIN ONCE A WEEK FOR 4 WEEKS, THEN INCREASE TO 0.5MG WEEKLY 1.5 mL 1  . doxycycline (MONODOX) 100 MG capsule Take 1 capsule (100 mg total) by mouth 2 (two) times daily. (Patient not taking: Reported on 01/10/2021) 20 capsule 1   No current facility-administered medications for this visit.    Allergies as of 01/10/2021 - Review Complete 01/10/2021  Allergen Reaction Noted  . [name] Other (See Comments) 11/30/2020  . Sulfa antibiotics Other (See Comments) 06/14/2020    ROS:  General: Negative for anorexia, weight loss, fever, chills, fatigue, weakness. ENT: Negative for hoarseness, difficulty swallowing , nasal congestion. CV: Negative for chest pain, angina, palpitations, dyspnea on exertion, peripheral edema.  Respiratory: Negative for dyspnea at rest, dyspnea on exertion, cough, sputum, wheezing.  GI: See history of present illness. GU:  Negative for dysuria, hematuria, urinary incontinence, urinary frequency, nocturnal urination.  Endo: Negative for unusual weight change.    Physical Examination:   BP (!) 143/78   Pulse 78   Temp 98.3 F (36.8 C) (Oral)   Ht 5' 5"  (1.651 m)   Wt 161 lb (73 kg)   BMI 26.79 kg/m   General: Well-nourished, well-developed in no  acute distress.  Eyes: No icterus. Conjunctivae pink. Mouth: Oropharyngeal mucosa moist and pink , no lesions erythema or exudate. Neck: Supple, Trachea midline Abdomen: Bowel sounds are normal, nontender, nondistended, no hepatosplenomegaly or masses, no abdominal bruits or hernia , no rebound or guarding.   Extremities: No lower extremity edema. No clubbing or deformities. Neuro: Alert and oriented x 3.  Grossly intact. Skin: Warm and dry, no jaundice.   Psych: Alert and cooperative, normal mood and affect.   Labs: CMP     Component Value Date/Time   NA 138 10/27/2020 1128   K 4.1 10/27/2020 1128   CL 99 10/27/2020 1128   CO2 25 10/27/2020 1128   GLUCOSE 137 (H) 10/27/2020 1128   GLUCOSE 147 (H) 08/12/2020 0851   BUN 12 10/27/2020 1128   CREATININE 0.76 10/27/2020 1128   CALCIUM 9.7 10/27/2020 1128   PROT 8.0 10/27/2020 1128   ALBUMIN 4.8 10/27/2020 1128   AST 33 10/27/2020 1128   ALT 32 10/27/2020 1128   ALKPHOS 84 10/27/2020 1128   BILITOT 0.3 10/27/2020 1128  GFRNONAA 81 10/27/2020 1128   GFRAA 93 10/27/2020 1128   Lab Results  Component Value Date   WBC 8.3 10/27/2020   HGB 13.0 10/27/2020   HCT 40.4 10/27/2020   MCV 84 10/27/2020   PLT 324 10/27/2020    Imaging Studies: No results found.  Assessment and Plan:   Tiffany Gregory is a 70 y.o. y/o female here for follow-up of elevated liver enzymes  Liver enzymes have normalized at this time  Patient with isolated hep B core antibody positive.  Based on literature and recommendations, mentioned in HPI, we discussed hepatitis B vaccination with the patient and she would like to proceed with this.  She will also need hepatitis A vaccination.  Therefore, start vaccination series for hep a and B  Patient also due for screening colonoscopy, and given chronic GERD, would also benefit from EGD for Barrett's screening  Patient is agreeable with the procedures, however, would not like to schedule at this time, would  like to go home and discuss with family as she states she does not know when she would have availability to have someone drive her there.  Once she figures this out, she states she will call us back  Dr Tiffany Gregory

## 2021-01-10 NOTE — Patient Instructions (Signed)
Hepatitis A and B Vaccine: Dose 1: Today Dose 2: 1 month from today (02/09/2021) Dose 3: 6 months from dose dose 1 (07/12/2021)

## 2021-01-15 ENCOUNTER — Encounter: Payer: Self-pay | Admitting: Emergency Medicine

## 2021-01-15 ENCOUNTER — Emergency Department: Payer: Medicare Other

## 2021-01-15 ENCOUNTER — Emergency Department
Admission: EM | Admit: 2021-01-15 | Discharge: 2021-01-15 | Disposition: A | Discharge status: Home / Self Care | Payer: Medicare Other | Attending: Emergency Medicine | Admitting: Emergency Medicine

## 2021-01-15 ENCOUNTER — Other Ambulatory Visit: Payer: Self-pay

## 2021-01-15 DIAGNOSIS — S0990XA Unspecified injury of head, initial encounter: Secondary | ICD-10-CM | POA: Diagnosis present

## 2021-01-15 DIAGNOSIS — S0003XA Contusion of scalp, initial encounter: Secondary | ICD-10-CM | POA: Insufficient documentation

## 2021-01-15 DIAGNOSIS — Z7984 Long term (current) use of oral hypoglycemic drugs: Secondary | ICD-10-CM | POA: Diagnosis not present

## 2021-01-15 DIAGNOSIS — Z7982 Long term (current) use of aspirin: Secondary | ICD-10-CM | POA: Diagnosis not present

## 2021-01-15 DIAGNOSIS — Z79899 Other long term (current) drug therapy: Secondary | ICD-10-CM | POA: Insufficient documentation

## 2021-01-15 DIAGNOSIS — J45909 Unspecified asthma, uncomplicated: Secondary | ICD-10-CM | POA: Diagnosis not present

## 2021-01-15 DIAGNOSIS — S7002XA Contusion of left hip, initial encounter: Secondary | ICD-10-CM | POA: Diagnosis not present

## 2021-01-15 DIAGNOSIS — E119 Type 2 diabetes mellitus without complications: Secondary | ICD-10-CM | POA: Diagnosis not present

## 2021-01-15 DIAGNOSIS — I10 Essential (primary) hypertension: Secondary | ICD-10-CM | POA: Diagnosis not present

## 2021-01-15 DIAGNOSIS — W06XXXA Fall from bed, initial encounter: Secondary | ICD-10-CM | POA: Insufficient documentation

## 2021-01-15 DIAGNOSIS — M25552 Pain in left hip: Secondary | ICD-10-CM

## 2021-01-15 NOTE — ED Provider Notes (Signed)
[facility] Emergency Department Provider Note   ____________________________________________    I have reviewed the triage vital signs and the nursing notes.   HISTORY  Chief Complaint Head Injury and Hip Pain     HPI Tiffany Gregory is a 71 y.o. female with history of diabetes, status post craniotomy in January of this year after a subdural hematoma.  Patient reports she fell out of bed accidentally and hit her head on the nightstand.  No LOC, no neuro deficits.  She complains of a goose egg to her left posterior scalp.  She also reports she bruised her left hip.  Past Medical History:  Diagnosis Date  . Allergy   . Asthma   . Diabetes mellitus without complication ([place])   . Elevated liver enzymes   . Hepatic steatosis   . History of hepatitis B 1971   She had IV drug use experimentation and was hosptialized and reports treated.   . Hyperlipidemia   . Hypertension     Patient Active Problem List   Diagnosis Date Noted  . Gastroesophageal reflux disease 11/29/2020  . Memory loss 10/27/2020  . History of craniotomy 10/27/2020  . Family history of GERD 10/27/2020  . Hyperlipidemia 06/22/2020  . Elevated liver enzymes 06/22/2020  . History of hepatitis B virus infection 06/22/2020  . Overweight with body mass index (BMI) of 27 to 27.9 in adult 06/22/2020  . Hepatic steatosis 06/15/2020  . Encounter for medical examination to establish care 06/14/2020  . Type 2 diabetes mellitus without complication, without long-term current use of insulin ([place]) 06/14/2020  . Essential hypertension 06/14/2020  . Skin lesion 06/14/2020    Past Surgical History:  Procedure Laterality Date  . ABDOMINAL HYSTERECTOMY    . BREAST BIOPSY    . CRANIECTOMY    . NASAL FRACTURE SURGERY  1975  . NASAL SINUS SURGERY  2010    Prior to Admission medications   Medication Sig Start Date End Date Taking? Authorizing Provider  aspirin EC 81 MG tablet Take 81 mg by  mouth daily. Swallow whole.    [provider]  atorvastatin (LIPITOR) 20 MG tablet Take 1 tablet (20 mg total) by mouth daily. 11/29/20   [name], [name], FNP  Blood Glucose Monitoring Suppl (ONE TOUCH ULTRA 2) w/Device KIT by Does not apply route.    [provider]  cyanocobalamin 1000 MCG tablet Take 1 tablet by mouth daily.    [provider]  doxycycline (MONODOX) 100 MG capsule Take 1 capsule (100 mg total) by mouth 2 (two) times daily. Patient not taking: Reported on 01/10/2021 12/15/20   [name], MD  glucose blood test strip 1 each by Other route as needed for other. Use as instructed. One touch ultra blue in vitro strips 08/15/20   Marval Regal, NP  metFORMIN (GLUCOPHAGE XR) 500 MG 24 hr tablet Take 2 tablets (1,000 mg total) by mouth daily with breakfast. 11/29/20   [name], [name], FNP  omeprazole (PRILOSEC) 20 MG capsule Take 2 capsules (40 mg total) by mouth daily. 11/29/20   [name], [name], FNP  OneTouch Delica Lancets 63S MISC by Does not apply route.    [provider]  polyethylene glycol (MIRALAX / GLYCOLAX) 17 g packet Take 17 g by mouth daily.    [provider]  ramipril (ALTACE) 10 MG capsule Take 1 capsule (10 mg total) by mouth 2 (two) times daily. 11/29/20   [name], [name], FNP  Semaglutide,0.25 or 0.5MG/DOS, (OZEMPIC, 0.25 OR 0.5 MG/DOSE,) 2 MG/1.5ML SOPN Inject 0.5 mg into the skin once a week. INJECT 0.25 MG INTO THE SKIN ONCE A WEEK FOR 4 WEEKS, THEN INCREASE TO 0.5MG WEEKLY 11/29/20   [name], [name], FNP     Allergies Lemon flavor, [name], and Sulfa antibiotics  Family History  Problem Relation Age of Onset  . Diabetes Mother   . Heart attack Mother   . Diabetes Father   . Heart disease Father   . Hypertension Father   . Diabetes Sister   . COPD Sister   . Heart disease Sister   . Diabetes Brother   . Heart attack Maternal Grandmother   . Cancer Maternal Grandfather    . Heart attack Paternal Grandmother   . Cancer Paternal Grandfather   . Heart attack Brother   . Heart disease Brother     Social History Social History   Tobacco Use  . Smoking status: Never Smoker  . Smokeless tobacco: Never Used  Vaping Use  . Vaping Use: Never used  Substance Use Topics  . Alcohol use: Not Currently  . Drug use: Never    Review of Systems  Constitutional: No fever/chills Eyes: No visual changes.  ENT: No nasal injury Cardiovascular: Denies chest pain. Respiratory: Denies shortness of breath. Gastrointestinal: No abdominal pain.    Genitourinary: Negative for dysuria. Musculoskeletal: As above Skin: Negative for rash. Neurological: As above   ____________________________________________   PHYSICAL EXAM:  VITAL SIGNS: ED Triage Vitals  Enc Vitals Group     BP 01/15/21 2007 (!) 165/84     Pulse Rate 01/15/21 2007 86     Resp 01/15/21 2007 18     Temp 01/15/21 2007 98.7 F (37.1 C)     Temp Source 01/15/21 2007 Oral     SpO2 01/15/21 2007 97 %     Weight 01/15/21 2007 71.2 kg (157 lb)     Height 01/15/21 2007 1.651 m (5' 5" )     Head Circumference --      Peak Flow --      Pain Score 01/15/21 2015 0     Pain Loc --      Pain Edu? --      Excl. in [place]? --     Constitutional: Alert and oriented. No acute distress. Pleasant and interactive Eyes: Conjunctivae are normal.  Head: Hematoma left posterior scalp Nose: No epistaxis or swelling Mouth/Throat: Mucous membranes are moist.   Neck:  Painless ROM, no pain with axial load Cardiovascular: Normal rate, regular rhythm.Kermit Balo peripheral circulation. Respiratory: Normal respiratory effort.  No retractions. Gastrointestinal: Soft and nontender. No distention.  No CVA tenderness. Genitourinary: deferred Musculoskeletal: Normal range of motion of all extremities, no pain with axial load of both hips.  Warm and well perfused Neurologic:  Normal speech and language. No gross focal  neurologic deficits are appreciated.  Skin:  Skin is warm, dry and intact. No rash noted. Psychiatric: Mood and affect are normal. Speech and behavior are normal.  ____________________________________________   LABS (all labs ordered are listed, but only abnormal results are displayed)  Labs Reviewed - No data to display ____________________________________________  EKG  None ____________________________________________  RADIOLOGY  CT head cervical spine without acute abnormality Hip x-ray reviewed by me, no acute abnormality ____________________________________________   PROCEDURES  Procedure(s) performed: No  Procedures   Critical Care performed: No ____________________________________________   INITIAL IMPRESSION / ASSESSMENT AND PLAN / ED COURSE  Pertinent  labs & imaging results that were available during my care of the patient were reviewed by me and considered in my medical decision making (see chart for details).  Patient presents after fall from bed with head injury.  She is concerned given her history of craniotomy.  No neuro deficits, no LOC, no nausea or vomiting.  CT imaging is reassuring, exam is reassuring  Hip x-ray without evidence of fracture.  Recommend supportive care, appropriate for discharge.    ____________________________________________   FINAL CLINICAL IMPRESSION(S) / ED DIAGNOSES  Final diagnoses:  Injury of head, initial encounter  Left hip pain        Note:  This document was prepared using Dragon voice recognition software and may include unintentional dictation errors.   Lavonia Drafts, MD 01/15/21 [id number]

## 2021-01-15 NOTE — ED Triage Notes (Signed)
Pt c/o of falling out of bed, head struck furniture (lump felt in back left of head), pain in left hip/buttocks area from floor.  Pt denies LOC or blood thinners, no meds taken prior to arrival.  Pt denies loss of vision, but reports speech "is slower than normal"   Pt reports hx of subdural hematoma with following craniectomy in Feb 2021 from a fall

## 2021-01-15 NOTE — ED Notes (Signed)
Pt presenting to ED after falling from her bed. Pt reports she was reaching for something off the floor when she slipped and hit her head on her nightstand. She reports having a "knot" on the back of her head and as well on her L hip. Takes ASA but denies blood thinners. Denies LOC. AAOx4

## 2021-01-16 ENCOUNTER — Telehealth: Payer: Self-pay

## 2021-01-16 ENCOUNTER — Telehealth: Payer: Self-pay | Admitting: *Deleted

## 2021-01-16 NOTE — Telephone Encounter (Signed)
Patient states she was told to take Motrin at the ED- she wants to know if it is the same as Advil- advised patient same medication class- may take one or the other but not together. She understands.

## 2021-01-16 NOTE — Telephone Encounter (Signed)
Patient has been advised. KW 

## 2021-01-16 NOTE — Telephone Encounter (Signed)
Copied from [place] [id number]. Topic: Appointment Scheduling - Scheduling Inquiry for Clinic >> Jan 16, 2021  9:43 AM Alanda Slim E wrote: Reason for CRM: Pt scheduled appt for a follow up from the ED for a fall/ Pts appt is for 4.19.22 and she wanted to know if she should be seen sooner so provider can look at her bruising / please advise if scheduled appt is fine

## 2021-01-16 NOTE — Telephone Encounter (Signed)
Patient is on asa 81 mg daily no other blood thinners. She can keep that appointment but if any worsening symptoms prior to that appointment she should return to Emergency room immediately at anytime.

## 2021-01-16 NOTE — Telephone Encounter (Signed)
See telephone encounter 01/16/21, patient has been advised. KW

## 2021-01-16 NOTE — Telephone Encounter (Signed)
Copied from [place] [phone]. Topic: Appointment Scheduling - Scheduling Inquiry for Clinic >> Jan 16, 2021  9:43 AM Tiffany Gregory wrote: Reason for CRM: Pt scheduled appt for a follow up from the ED for a fall/ Pts appt is for 4.19.22 and she wanted to know if she should be seen sooner so provider can look at her bruising / please advise if scheduled appt is fine

## 2021-01-21 ENCOUNTER — Other Ambulatory Visit: Payer: Self-pay | Admitting: Adult Health

## 2021-01-21 DIAGNOSIS — E119 Type 2 diabetes mellitus without complications: Secondary | ICD-10-CM

## 2021-01-21 NOTE — Telephone Encounter (Signed)
Requested Prescriptions  Pending Prescriptions Disp Refills  . OZEMPIC, 0.25 OR 0.5 MG/DOSE, 2 MG/1.5ML SOPN [Pharmacy Med Name: OZEMPIC 0.25 OR 0.5MG /DOS 1X2MG  PEN] 1.5 mL 1    Sig: INJECT 0.25MG  UNDER THE SKIN ONCE A WEEK FOR 4 WEEKS THEN INCREASE TO 0.5MG  A WEEK     Endocrinology:  Diabetes - GLP-1 Receptor Agonists Passed - 01/21/2021 12:27 PM      Passed - HBA1C is between 0 and 7.9 and within 180 days    Hgb A1c MFr Bld  Date Value Ref Range Status  10/27/2020 7.6 (H) 4.8 - 5.6 % Final    Comment:             Prediabetes: 5.7 - 6.4          Diabetes: >6.4          Glycemic control for adults with diabetes: <7.0          Passed - Valid encounter within last 6 months    Recent Outpatient Visits          1 month ago Type 2 diabetes mellitus without complication, without long-term current use of insulin ([place])   [facility] [name], [name], FNP   2 months ago Memory loss   [employer] [facility], [name], FNP      Future Appointments            In 3 days [name], [name], [place], [place]   In 2 weeks  [place]   In 5 months Tahiliani, Lennette Bihari, MD [place]

## 2021-01-24 ENCOUNTER — Other Ambulatory Visit: Payer: Self-pay

## 2021-01-24 ENCOUNTER — Ambulatory Visit (INDEPENDENT_AMBULATORY_CARE_PROVIDER_SITE_OTHER): Payer: Medicare Other | Admitting: Adult Health

## 2021-01-24 ENCOUNTER — Ambulatory Visit
Admission: RE | Admit: 2021-01-24 | Discharge: 2021-01-24 | Disposition: A | Discharge status: Home / Self Care | Payer: Medicare Other | Source: Ambulatory Visit | Attending: Adult Health | Admitting: Adult Health

## 2021-01-24 ENCOUNTER — Encounter: Payer: Self-pay | Admitting: Adult Health

## 2021-01-24 ENCOUNTER — Ambulatory Visit
Admission: RE | Admit: 2021-01-24 | Discharge: 2021-01-24 | Disposition: A | Discharge status: Home / Self Care | Payer: Medicare Other | Attending: Adult Health | Admitting: Adult Health

## 2021-01-24 VITALS — BP 136/70 | HR 71 | Temp 98.0°F | Resp 16 | Wt 161.2 lb

## 2021-01-24 DIAGNOSIS — E538 Deficiency of other specified B group vitamins: Secondary | ICD-10-CM | POA: Diagnosis not present

## 2021-01-24 DIAGNOSIS — M62838 Other muscle spasm: Secondary | ICD-10-CM | POA: Diagnosis not present

## 2021-01-24 DIAGNOSIS — M542 Cervicalgia: Secondary | ICD-10-CM | POA: Insufficient documentation

## 2021-01-24 DIAGNOSIS — Z9889 Other specified postprocedural states: Secondary | ICD-10-CM

## 2021-01-24 DIAGNOSIS — R413 Other amnesia: Secondary | ICD-10-CM

## 2021-01-24 DIAGNOSIS — W19XXXD Unspecified fall, subsequent encounter: Secondary | ICD-10-CM

## 2021-01-24 DIAGNOSIS — I10 Essential (primary) hypertension: Secondary | ICD-10-CM

## 2021-01-24 DIAGNOSIS — W19XXXA Unspecified fall, initial encounter: Secondary | ICD-10-CM | POA: Insufficient documentation

## 2021-01-24 MED ORDER — CYCLOBENZAPRINE HCL 5 MG PO TABS: 5.0000 mg | ORAL_TABLET | Freq: Every evening | ORAL | 0 refills | Status: DC | PRN

## 2021-01-24 NOTE — Progress Notes (Signed)
Established patient visit   Patient: Tiffany Gregory   DOB: 1951/01/15   70 y.o. Female  MRN: [id number] Visit Date: 01/24/2021  Today's healthcare provider: Marcille Buffy, FNP   Chief Complaint  Patient presents with  . Hospitalization Follow-up   Subjective    HPI  Follow up Hospitalization  Patient was admitted to [facility] on 01/15/2021 and discharged on 01/15/2021. She was treated for fall that injured her head and hip.She was on the bed and something fell off the dresser to the floor and she reached down to pick it up while still on the bed and fell over onto the dresser she lost her balance. She was not unconscious.   Treatment for this included No neuro deficits, no LOC, no nausea or vomiting.CT imaging is reassuring, exam is reassuring Hip x-ray without evidence of fracture.  Recommend supportive care, appropriate for discharge . Telephone follow up was done on 01/16/2021 She reports satisfactory compliance with treatment. She reports this condition is stayed the same. Patient reports neck pain has been persistent, she has been alternating between Tylenol and Advil BID. Patient reports bruising on left side of body and reports tenderness of scalp on the left side.   No bleeding. She has been seen by Dr. Manuella Ghazi neurology and EEG showed possible seizure activity and video EEG was advised.  Mild headache since fall , however " I am having sinus congestion too so hard to tell" reports as mild headache.  She does report being very sleepy at time and " falling off to sleep" and awakening one hour or so later not knowing where the time went.    Telephone Encounter - Danella Maiers, [place] - 01/09/2021 1:11 PM EDT Formatting of this note might be different from the original. ----- Message from [facility], MD sent at 01/08/2021 7:58 PM EDT ----- Inform patient of EEG report, forward to PCP/referring physician.Patient should have in office prolonged video EEG  monitoring if she continues to have seizure like activity. Electronically signed by Danella Maiers, CMA at 01/09/2021 1:11 PM EDT  EXAM:  CT CERVICAL SPINE WITHOUT CONTRAST  Status post craniotomy in January after a subdural hematoma. IMPRESSION:  Straightening of the cervical spine with degenerative changes. No  acute osseous abnormality.    Electronically Signed  By: Donavan Foil M.D.  On: 01/15/2021 21:10  CT head  MPRESSION:  1. No CT evidence for acute intracranial abnormality. Status post  right craniotomy with dural thickening on the right which is stable  with the previous examinations.  2. Mild white matter hypodensity, likely chronic small vessel  ischemic change.    Electronically Signed  By: Donavan Foil M.D.  On: 01/15/2021 21:06   Hip x ray was normal.  ----------------------------------------------------------------------------------------- -  Patient  denies any fever, body aches,chills, rash, chest pain, shortness of breath, nausea, vomiting, or diarrhea.  Denies dizziness, lightheadedness, pre syncopal or syncopal episodes.    Patient Active Problem List   Diagnosis Date Noted  . Muscle spasm 01/24/2021  . B12 deficiency 01/24/2021  . Neck pain 01/24/2021  . Fall 01/24/2021  . Gastroesophageal reflux disease 11/29/2020  . Memory loss 10/27/2020  . History of craniotomy 10/27/2020  . Family history of GERD 10/27/2020  . Hyperlipidemia 06/22/2020  . Elevated liver enzymes 06/22/2020  . History of hepatitis B virus infection 06/22/2020  . Overweight with body mass index (BMI) of 27 to 27.9 in adult 06/22/2020  . Hepatic steatosis 06/15/2020  . Encounter for  medical examination to establish care 06/14/2020  . Type 2 diabetes mellitus without complication, without long-term current use of insulin ([place]) 06/14/2020  . Essential hypertension 06/14/2020  . Skin lesion 06/14/2020   Past Medical History:  Diagnosis Date  . Allergy   .  Asthma   . Diabetes mellitus without complication ([place])   . Elevated liver enzymes   . Hepatic steatosis   . History of hepatitis B 1971   She had IV drug use experimentation and was hosptialized and reports treated.   . Hyperlipidemia   . Hypertension    Past Surgical History:  Procedure Laterality Date  . ABDOMINAL HYSTERECTOMY    . BREAST BIOPSY    . CRANIECTOMY    . NASAL FRACTURE SURGERY  1975  . NASAL SINUS SURGERY  2010   Social History   Tobacco Use  . Smoking status: Never Smoker  . Smokeless tobacco: Never Used  Vaping Use  . Vaping Use: Never used  Substance Use Topics  . Alcohol use: Not Currently  . Drug use: Never   Social History   Socioeconomic History  . Marital status: Married    Spouse name: Not on file  . Number of children: 2  . Years of education: Not on file  . Highest education level: Bachelor's degree (e.g., BA, AB, BS)  Occupational History  . Occupation: self employed  Tobacco Use  . Smoking status: Never Smoker  . Smokeless tobacco: Never Used  Vaping Use  . Vaping Use: Never used  Substance and Sexual Activity  . Alcohol use: Not Currently  . Drug use: Never  . Sexual activity: Not Currently    Partners: Male  Other Topics Concern  . Not on file  Social History Narrative  . Not on file   Social Determinants of Health   Financial Resource Strain: Medium Risk  . Difficulty of Paying Living Expenses: Somewhat hard  Food Insecurity: No Food Insecurity  . Worried About Charity fundraiser in the Last Year: Never true  . Ran Out of Food in the Last Year: Never true  Transportation Needs: No Transportation Needs  . Lack of Transportation (Medical): No  . Lack of Transportation (Non-Medical): No  Physical Activity: Inactive  . Days of Exercise per Week: 0 days  . Minutes of Exercise per Session: 0 min  Stress: Stress Concern Present  . Feeling of Stress : To some extent  Social Connections: Moderately Isolated  . Frequency of  Communication with Friends and Family: More than three times a week  . Frequency of Social Gatherings with Friends and Family: Never  . Attends Religious Services: Never  . Active Member of Clubs or Organizations: No  . Attends Archivist Meetings: Never  . Marital Status: Married  Human resources officer Violence: Not At Risk  . Fear of Current or Ex-Partner: No  . Emotionally Abused: No  . Physically Abused: No  . Sexually Abused: No   Family Status  Relation Name Status  . Mother  Deceased  . Father  Deceased  . Sister Charolette Deceased  . Brother ONEOK  . Daughter  Alive  . Son  Alive  . MGM  Deceased  . MGF  Deceased  . PGM  Deceased  . PGF  Deceased  . Brother [employer]   Family History  Problem Relation Age of Onset  . Diabetes Mother   . Heart attack Mother   . Diabetes Father   . Heart disease Father   .  Hypertension Father   . Diabetes Sister   . COPD Sister   . Heart disease Sister   . Diabetes Brother   . Heart attack Maternal Grandmother   . Cancer Maternal Grandfather   . Heart attack Paternal Grandmother   . Cancer Paternal Grandfather   . Heart attack Brother   . Heart disease Brother    Allergies  Allergen Reactions  . Lemon Flavor Anaphylaxis    "my throat closes up"  . [name] Other (See Comments)  . Sulfa Antibiotics Other (See Comments)       Medications: Outpatient Medications Prior to Visit  Medication Sig  . aspirin EC 81 MG tablet Take 81 mg by mouth daily. Swallow whole.  Marland Kitchen atorvastatin (LIPITOR) 20 MG tablet Take 1 tablet (20 mg total) by mouth daily.  . Blood Glucose Monitoring Suppl (ONE TOUCH ULTRA 2) w/Device KIT by Does not apply route.  . cyanocobalamin 1000 MCG tablet Take 1 tablet by mouth daily.  Marland Kitchen glucose blood test strip 1 each by Other route as needed for other. Use as instructed. One touch ultra blue in vitro strips  . metFORMIN (GLUCOPHAGE XR) 500 MG 24 hr tablet Take 2 tablets (1,000  mg total) by mouth daily with breakfast.  . omeprazole (PRILOSEC) 20 MG capsule Take 2 capsules (40 mg total) by mouth daily.  Glory Rosebush Delica Lancets 74J MISC by Does not apply route.  Marland Kitchen OZEMPIC, 0.25 OR 0.5 MG/DOSE, 2 MG/1.5ML SOPN INJECT 0.25MG UNDER THE SKIN ONCE A WEEK FOR 4 WEEKS THEN INCREASE TO 0.5MG A WEEK  . polyethylene glycol (MIRALAX / GLYCOLAX) 17 g packet Take 17 g by mouth daily.  . ramipril (ALTACE) 10 MG capsule Take 1 capsule (10 mg total) by mouth 2 (two) times daily.  . [DISCONTINUED] doxycycline (MONODOX) 100 MG capsule Take 1 capsule (100 mg total) by mouth 2 (two) times daily. (Patient not taking: Reported on 01/10/2021)   No facility-administered medications prior to visit.    Review of Systems  Last CBC Lab Results  Component Value Date   WBC 8.3 10/27/2020   HGB 13.0 10/27/2020   HCT 40.4 10/27/2020   MCV 84 10/27/2020   MCH 27.1 10/27/2020   RDW 13.2 10/27/2020   PLT 324 28/78/6767   Last metabolic panel Lab Results  Component Value Date   GLUCOSE 137 (H) 10/27/2020   NA 138 10/27/2020   K 4.1 10/27/2020   CL 99 10/27/2020   CO2 25 10/27/2020   BUN 12 10/27/2020   CREATININE 0.76 10/27/2020   GFRNONAA 81 10/27/2020   GFRAA 93 10/27/2020   CALCIUM 9.7 10/27/2020   PROT 8.0 10/27/2020   ALBUMIN 4.8 10/27/2020   LABGLOB 3.2 10/27/2020   AGRATIO 1.5 10/27/2020   BILITOT 0.3 10/27/2020   ALKPHOS 84 10/27/2020   AST 33 10/27/2020   ALT 32 10/27/2020   ANIONGAP 9 06/20/2020   Last lipids Lab Results  Component Value Date   CHOL 139 10/27/2020   HDL 32 (L) 10/27/2020   LDLCALC 74 10/27/2020   LDLDIRECT 109.0 06/14/2020   TRIG 192 (H) 10/27/2020   CHOLHDL 5 06/14/2020   Last hemoglobin A1c Lab Results  Component Value Date   HGBA1C 7.6 (H) 10/27/2020   Last thyroid functions Lab Results  Component Value Date   TSH 1.920 10/27/2020   Last vitamin D Lab Results  Component Value Date   VD25OH 21.95 (L) 06/14/2020   Last vitamin  B12 and Folate Lab Results  Component Value Date   VITAMINB12 368 10/27/2020   FOLATE 3.4 10/27/2020       Objective    BP (!) 144/77   Pulse 71   Resp 16   Wt 161 lb 3.2 oz (73.1 kg)   SpO2 100%   BMI 26.83 kg/m  BP Readings from Last 3 Encounters:  01/24/21 (!) 144/77  01/15/21 (!) 141/89  01/10/21 (!) 143/78   Wt Readings from Last 3 Encounters:  01/24/21 161 lb 3.2 oz (73.1 kg)  01/15/21 157 lb (71.2 kg)  01/10/21 161 lb (73 kg)       Physical Exam Vitals reviewed.  Constitutional:      General: She is not in acute distress.    Appearance: Normal appearance. She is not ill-appearing, toxic-appearing or diaphoretic.  HENT:     Head: Normocephalic and atraumatic.     Right Ear: Tympanic membrane, ear canal and external ear normal.     Left Ear: Tympanic membrane, ear canal and external ear normal.     Nose: Congestion present.     Mouth/Throat:     Pharynx: Oropharynx is clear. No oropharyngeal exudate or posterior oropharyngeal erythema.  Eyes:     General: No scleral icterus.       Right eye: No discharge.        Left eye: No discharge.     Conjunctiva/sclera: Conjunctivae normal.     Pupils: Pupils are equal, round, and reactive to light.  Neck:     Vascular: No carotid bruit.  Cardiovascular:     Rate and Rhythm: Normal rate and regular rhythm.     Pulses: Normal pulses.     Heart sounds: Normal heart sounds. No murmur heard. No friction rub. No gallop.   Abdominal:     General: Bowel sounds are normal. There is no distension.     Tenderness: There is no abdominal tenderness.  Musculoskeletal:        General: Normal range of motion.     Cervical back: Normal range of motion. Spasms and tenderness present. No swelling, edema, deformity, erythema, signs of trauma, lacerations, rigidity, torticollis, bony tenderness or crepitus. No pain with movement. Normal range of motion.     Thoracic back: Normal.     Lumbar back: Normal.       Back:     Right  lower leg: No edema.     Left lower leg: No edema.       Legs:     Comments: Resolving bruise on left buttock from fall.   Cervical muscle tenderness with palpation.   Lymphadenopathy:     Cervical: No cervical adenopathy.  Skin:    General: Skin is warm.     Findings: No erythema or rash.  Neurological:     General: No focal deficit present.     Mental Status: She is oriented to person, place, and time.  Psychiatric:        Mood and Affect: Mood normal.        Behavior: Behavior normal.        Thought Content: Thought content normal.        Judgment: Judgment normal.       No results found for any visits on 01/24/21.  Assessment & Plan     Neck pain - Plan: CBC with Differential/Platelet, Comprehensive Metabolic Panel (CMET), DG Cervical Spine Complete, cyclobenzaprine (FLEXERIL) 5 MG tablet, AMB Referral to [facility]  B12 deficiency - Plan: B12 and Folate Panel,  [facility]B [facility] to [facility]  Muscle spasm - Plan: AMB Referral to [place]  History of craniotomy - Plan: AMB Referral to [place], subsequent encounter - Plan: AMB Referral to [place]  Essential hypertension - Plan: AMB Referral to [place]  Memory loss - Plan: AMB Referral to [place]  Needs sooner follow up for EEG video with Dr. Manuella Ghazi, concerned given patient reports of drifting off to sleep over the past few months.  Advise no driving until cleared by neurology is advised.   [facility], 03/20/21. She saw Dub Mikes as well for subdural shunt  as well 12/01/20.She will call neurologist office today.  CC; note to Dr. Manuella Ghazi and Dr Cari Caraway given her critical  neurological history.   Today  In office she is alert and oriented and in no acute distress.   Red Flags discussed. The patient was given clear instructions to go to ER or return to medical center if any red flags  develop, symptoms do not improve, worsen or new problems develop. They verbalized understanding.  Labs today.  Orders Placed This Encounter  Procedures  . DG Cervical Spine Complete  . CBC with Differential/Platelet  . Comprehensive Metabolic Panel (CMET)  . B12 and Folate Panel  . AMB Referral to [place]  Pharmacist to see if help with Ozempic cost.  She is monitoring blood glucose as well denies any hypoglycemic numbers.    Return in about 3 weeks (around 02/14/2021), or if symptoms worsen or fail to improve, for at any time for any worsening symptoms, Go to Emergency room/ urgent care if worse.      The entirety of the information documented in the History of Present Illness, Review of Systems and Physical Exam were personally obtained by me. Portions of this information were initially documented by the CMA and reviewed by me for thoroughness and accuracy.      Marcille Buffy, [place] [phone] (phone) [phone] (fax)  [place]

## 2021-01-24 NOTE — Patient Instructions (Addendum)
Back to top of Miscellaneous Notes Telephone Encounter - Danella Maiers, [place] - 01/09/2021 1:11 PM EDT Formatting of this note might be different from the original. ----- Message from [facility], MD sent at 01/08/2021 7:58 PM EDT ----- Inform patient of EEG report, forward to PCP/referring physician.Patient should have in office prolonged video EEG monitoring if she continues to have seizure like activity. Electronically signed by Danella Maiers, [place] at 01/09/2021 1:11 PM EDT  Back to top of Miscellaneous Notes  Plan of Treatment - documented as of this encounter  Upcoming Encounters Upcoming Encounters  Date Type Specialty Care Team Description  03/20/2021 Office Visit Neurology Ray Church, [place]  [facility]  [place], [place] [address]  [phone] (Work)  [phone] (Fax)      Health Maintenance After Age 22 After age 29, you are at a higher risk for certain long-term diseases and infections as well as injuries from falls. Falls are a major cause of broken bones and head injuries in people who are older than age 28. Getting regular preventive care can help to keep you healthy and well. Preventive care includes getting regular testing and making lifestyle changes as recommended by your health care provider. Talk with your health care provider about:  Which screenings and tests you should have. A screening is a test that checks for a disease when you have no symptoms.  A diet and exercise plan that is right for you. What should I know about screenings and tests to prevent falls? Screening and testing are the best ways to find a health problem early. Early diagnosis and treatment give you the best chance of managing medical conditions that are common after age 31. Certain conditions and lifestyle choices may make you more likely to have a fall. Your health care provider may recommend:  Regular vision checks.  Poor vision and conditions such as cataracts can make you more likely to have a fall. If you wear glasses, make sure to get your prescription updated if your vision changes.  Medicine review. Work with your health care provider to regularly review all of the medicines you are taking, including over-the-counter medicines. Ask your health care provider about any side effects that may make you more likely to have a fall. Tell your health care provider if any medicines that you take make you feel dizzy or sleepy.  Osteoporosis screening. Osteoporosis is a condition that causes the bones to get weaker. This can make the bones weak and cause them to break more easily.  Blood pressure screening. Blood pressure changes and medicines to control blood pressure can make you feel dizzy.  Strength and balance checks. Your health care provider may recommend certain tests to check your strength and balance while standing, walking, or changing positions.  Foot health exam. Foot pain and numbness, as well as not wearing proper footwear, can make you more likely to have a fall.  Depression screening. You may be more likely to have a fall if you have a fear of falling, feel emotionally low, or feel unable to do activities that you used to do.  Alcohol use screening. Using too much alcohol can affect your balance and may make you more likely to have a fall. What actions can I take to lower my risk of falls? General instructions  Talk with your health care provider about your risks for falling. Tell your health care provider if: ? You fall. Be sure to tell your health  care provider about all falls, even ones that seem minor. ? You feel dizzy, sleepy, or off-balance.  Take over-the-counter and prescription medicines only as told by your health care provider. These include any supplements.  Eat a healthy diet and maintain a healthy weight. A healthy diet includes low-fat dairy products, low-fat (lean) meats, and  fiber from whole grains, beans, and lots of fruits and vegetables. Home safety  Remove any tripping hazards, such as rugs, cords, and clutter.  Install safety equipment such as grab bars in bathrooms and safety rails on stairs.  Keep rooms and walkways well-lit. Activity  Follow a regular exercise program to stay fit. This will help you maintain your balance. Ask your health care provider what types of exercise are appropriate for you.  If you need a cane or walker, use it as recommended by your health care provider.  Wear supportive shoes that have nonskid soles.   Lifestyle  Do not drink alcohol if your health care provider tells you not to drink.  If you drink alcohol, limit how much you have: ? 0-1 drink a day for women. ? 0-2 drinks a day for men.  Be aware of how much alcohol is in your drink. In the [place] one drink equals one typical bottle of beer (12 oz), one-half glass of wine (5 oz), or one shot of hard liquor (1 oz).  Do not use any products that contain nicotine or tobacco, such as cigarettes and e-cigarettes. If you need help quitting, ask your health care provider. Summary  Having a healthy lifestyle and getting preventive care can help to protect your health and wellness after age 31.  Screening and testing are the best way to find a health problem early and help you avoid having a fall. Early diagnosis and treatment give you the best chance for managing medical conditions that are more common for people who are older than age 4.  Falls are a major cause of broken bones and head injuries in people who are older than age 49. Take precautions to prevent a fall at home.  Work with your health care provider to learn what changes you can make to improve your health and wellness and to prevent falls. This information is not intended to replace advice given to you by your health care provider. Make sure you discuss any questions you have with your health care  provider. Document Revised: 01/15/2019 Document Reviewed: 08/07/2017 Elsevier Patient Education  2021 [place].  Muscle Cramps and Spasms Muscle cramps and spasms are when muscles tighten by themselves. They usually get better within minutes. Muscle cramps are painful. They are usually stronger and last longer than muscle spasms. Muscle spasms may or may not be painful. They can last a few seconds or much longer. Cramps and spasms can affect any muscle, but they occur most often in the calf muscles of the leg. They are usually not caused by a serious problem. In many cases, the cause is not known. Some common causes include:  Doing more physical work or exercise than your body is ready for.  Using the muscles too much (overuse) by repeating certain movements too many times.  Staying in a certain position for a long time.  Playing a sport or doing an activity without preparing properly.  Using bad form or technique while playing a sport or doing an activity.  Not having enough water in your body (dehydration).  Injury.  Side effects of some medicines.  Low levels of  the salts and minerals in your blood (electrolytes), such as low potassium or calcium. Follow these instructions at home: Managing pain and stiffness  Massage, stretch, and relax the muscle. Do this for many minutes at a time.  If told, put heat on tight or tense muscles as often as told by your doctor. Use the heat source that your doctor recommends, such as a moist heat pack or a heating pad. ? Place a towel between your skin and the heat source. ? Leave the heat on for 20-30 minutes. ? Remove the heat if your skin turns bright red. This is very important if you are not able to feel pain, heat, or cold. You may have a greater risk of getting burned.  If told, put ice on the affected area. This may help if you are sore or have pain after a cramp or spasm. ? Put ice in a plastic bag. ? Place a towel between your  skin and the bag. ? Leave the ice on for 20 minutes, 2-3 times a day.  Try taking hot showers or baths to help relax tight muscles.      Eating and drinking  Drink enough fluid to keep your pee (urine) pale yellow.  Eat a healthy diet to help ensure that your muscles work well. This should include: ? Fruits and vegetables. ? Lean protein. ? Whole grains. ? Low-fat or nonfat dairy products. General instructions  If you are having cramps often, avoid intense exercise for several days.  Take over-the-counter and prescription medicines only as told by your doctor.  Watch for any changes in your symptoms.  Keep all follow-up visits as told by your doctor. This is important. Contact a doctor if:  Your cramps or spasms get worse or happen more often.  Your cramps or spasms do not get better with time. Summary  Muscle cramps and spasms are when muscles tighten by themselves. They usually get better within minutes.  Cramps and spasms occur most often in the calf muscles of the leg.  Massage, stretch, and relax the muscle. This may help the cramp or spasm go away.  Drink enough fluid to keep your pee (urine) pale yellow. This information is not intended to replace advice given to you by your health care provider. Make sure you discuss any questions you have with your health care provider. Document Revised: 02/17/2018 Document Reviewed: 02/17/2018 Elsevier Patient Education  [place].

## 2021-01-25 LAB — CBC WITH DIFFERENTIAL/PLATELET
Basophils Absolute: 0.1 10*3/uL (ref 0.0–0.2)
Basos: 1 %
EOS (ABSOLUTE): 0.3 10*3/uL (ref 0.0–0.4)
Eos: 3 %
Hematocrit: 37.1 % (ref 34.0–46.6)
Hemoglobin: 12.4 g/dL (ref 11.1–15.9)
Immature Grans (Abs): 0 10*3/uL (ref 0.0–0.1)
Immature Granulocytes: 0 %
Lymphocytes Absolute: 2.3 10*3/uL (ref 0.7–3.1)
Lymphs: 22 %
MCH: 27.9 pg (ref 26.6–33.0)
MCHC: 33.4 g/dL (ref 31.5–35.7)
MCV: 83 fL (ref 79–97)
Monocytes Absolute: 0.6 10*3/uL (ref 0.1–0.9)
Monocytes: 6 %
Neutrophils Absolute: 7 10*3/uL (ref 1.4–7.0)
Neutrophils: 68 %
Platelets: 320 10*3/uL (ref 150–450)
RBC: 4.45 x10E6/uL (ref 3.77–5.28)
RDW: 13.3 % (ref 11.7–15.4)
WBC: 10.4 10*3/uL (ref 3.4–10.8)

## 2021-01-25 LAB — COMPREHENSIVE METABOLIC PANEL
ALT: 25 IU/L (ref 0–32)
AST: 26 IU/L (ref 0–40)
Albumin/Globulin Ratio: 1.5 (ref 1.2–2.2)
Albumin: 4.5 g/dL (ref 3.8–4.8)
Alkaline Phosphatase: 76 IU/L (ref 44–121)
BUN/Creatinine Ratio: 18 (ref 12–28)
BUN: 13 mg/dL (ref 8–27)
Bilirubin Total: 0.3 mg/dL (ref 0.0–1.2)
CO2: 23 mmol/L (ref 20–29)
Calcium: 9.6 mg/dL (ref 8.7–10.3)
Chloride: 98 mmol/L (ref 96–106)
Creatinine, Ser: 0.73 mg/dL (ref 0.57–1.00)
Globulin, Total: 3 g/dL (ref 1.5–4.5)
Glucose: 94 mg/dL (ref 65–99)
Potassium: 4.3 mmol/L (ref 3.5–5.2)
Sodium: 137 mmol/L (ref 134–144)
Total Protein: 7.5 g/dL (ref 6.0–8.5)
eGFR: 89 mL/min/{1.73_m2} (ref >59)

## 2021-01-25 LAB — B12 AND FOLATE PANEL
Folate: 5.1 ng/mL (ref >3.0)
Vitamin B-12: 929 pg/mL (ref 232–1245)

## 2021-01-25 NOTE — Progress Notes (Signed)
Degenerative change in C6 and C7 seen on x ray. Carotid artery calcifications also noted on ultrasound, recommend carotid ultrasound if patient in agreement.  Orthopedics if neck pain persists is advised.  Ok to place referral.

## 2021-01-25 NOTE — Progress Notes (Signed)
Labs within normal limits

## 2021-01-26 ENCOUNTER — Telehealth: Payer: Self-pay

## 2021-01-26 ENCOUNTER — Telehealth: Payer: Self-pay | Admitting: *Deleted

## 2021-01-26 DIAGNOSIS — M542 Cervicalgia: Secondary | ICD-10-CM

## 2021-01-26 DIAGNOSIS — I6529 Occlusion and stenosis of unspecified carotid artery: Secondary | ICD-10-CM

## 2021-01-26 NOTE — Telephone Encounter (Signed)
Patient has been advised and order has been placed. KW

## 2021-01-26 NOTE — Telephone Encounter (Signed)
-----   Message from [name], [place] sent at 01/25/2021  2:34 PM EDT ----- Degenerative change in C6 and C7 seen on x ray. Carotid artery calcifications also noted on ultrasound, recommend carotid ultrasound if patient in agreement.  Orthopedics if neck pain persists is advised.  Ok to place referral.

## 2021-01-26 NOTE — Chronic Care Management (AMB) (Signed)
  Chronic Care Management   Note  01/26/2021 Name: Atiyah Bauer MRN: [id number] DOB: 04-26-1951  Melynda Krzywicki is a 70 y.o. year old female who is a primary care patient of [name], [name], FNP. I reached out to [name] by phone today in response to a referral sent by Ms. Butch Penny Grauberger's PCP, [name], [name], FNP.     Ms. Greenwalt was given information about Chronic Care Management services today including:  1. CCM service includes personalized support from designated clinical staff supervised by her physician, including individualized plan of care and coordination with other care providers 2. 24/7 contact phone numbers for assistance for urgent and routine care needs. 3. Service will only be billed when office clinical staff spend 20 minutes or more in a month to coordinate care. 4. Only one practitioner may furnish and bill the service in a calendar month. 5. The patient may stop CCM services at any time (effective at the end of the month) by phone call to the office staff. 6. The patient will be responsible for cost sharing (co-pay) of up to 20% of the service fee (after annual deductible is met).  Patient agreed to services and verbal consent obtained.   Follow up plan: Telephone appointment with care management team member scheduled for: 02/03/2021  [place] [facility]

## 2021-01-30 ENCOUNTER — Telehealth: Payer: Self-pay

## 2021-01-30 ENCOUNTER — Telehealth: Payer: Self-pay | Admitting: *Deleted

## 2021-01-30 NOTE — Progress Notes (Signed)
Error

## 2021-01-30 NOTE — Progress Notes (Signed)
Chronic Care Management Pharmacy Assistant   Name: Tiffany Gregory  MRN: [id number] DOB: Jul 08, 1951  Reason for Encounter: Chart Review for CPP on 02/03/2021  Conditions to be addressed/monitored: HTN, HLD, DMII and Hepatic Steatosis, GERD, Memory Loss, B12 deficiency  Primary concerns for visit include: Patient assistance for Ozempic.   Recent office visits:  01/24/2021 Tiffany Peace FNP (PCP) - Started cyclobenzaprine 5 mg tablet PRN at bedtime, AMB Referral to [place] 11/29/2020 Tiffany Neighbors LPN (PCP Office) - No medication changes 11/29/2020 Tiffany Peace FNP (PCP) - Change metformin 1000 mg  Twice daily to once daily with breakfast, Change Omeprazole from 20 mg to 40 mg daily 10/27/2020 Tiffany Peace FNP (PCP) - No medication changes 08/24/2020 Tiffany Gregory CCM  Recent consult visits:  01/10/2021 Tiffany Gregory (Gastrienterology)- No medication Changes 12/15/2020 Tiffany Gregory (Dermatology) -restarted Doxycycline 100 mg and Mupirocin oint with any flares 12/08/2020 TiffanyShah MD (Neurology) - No medication changes 11/09/2020 Dr. Manuella Ghazi MD (Neurology)  11/02/2020 Tiffany Gregory (Dermatology) -  Started Doxycycline 138m 1 po qd with food and drink x 10 days,Started Mupirocin oint bid axilla and qd to each nostril, Started Cln wash qd 10/11/2020 TiffanyTahiliani MD (Gastroenterology) - No medication changes 08/24/2020 BBenay Gregory(ophthalmology)- No medication changes  Hospital visits:  Medication Reconciliation was completed by comparing discharge summary, patient's EMR and Pharmacy list, and upon discussion with patient.  Admitted to the hospital on 01/15/2021 due to Injury of head. Discharge date was 01/15/2021. [facility]   Medication Changes at Hospital Discharge: -Changed None   Medications Discontinued at Hospital Discharge: -Stopped None    Medications that remain the same after Hospital Discharge:??  -All other medications will remain the same.   Admitted to the hospital on 10/19/2020 due to Facial laceration. Discharge date was 10/19/2020. [facility]  Medication Changes at Hospital Discharge: -Changed none  Medications Discontinued at Hospital Discharge: -Stopped none   Medications that remain the same after Hospital Discharge:??  -All other medications will remain the same.    Have you seen any other providers since your last visit? no Any changes in your medications or health? no Any side effects from any medications? no Do you have an symptoms or problems not managed by your medications? no Any concerns about your health right now? no Has your provider asked that you check blood pressure, blood sugar, or follow special diet at home? No  Patient states she checks her blood pressure here and there. Do you get any type of exercise on a regular basis? No  Patient states she walks her dog. Can you think of a goal you would like to reach for your health? None ID  Do you have any problems getting your medications? Yes  Patient states her Ozempic has place her in the donut hole.Patient states she Ozempic is $197.00 right now. Is there anything that you would like to discuss during the appointment?   Patient assistance for Ozempic.  Please bring medications and supplements to appointment   Medications: Outpatient Encounter Medications as of 01/30/2021  Medication Sig  . aspirin EC 81 MG tablet Take 81 mg by mouth daily. Swallow whole.  .Marland Kitchenatorvastatin (LIPITOR) 20 MG tablet Take 1 tablet (20 mg total) by mouth daily.  . Blood Glucose Monitoring Suppl (ONE TOUCH ULTRA 2) w/Device KIT by Does not apply route.  .Marland Kitchen  cyanocobalamin 1000 MCG tablet Take 1 tablet by mouth daily.  . cyclobenzaprine (FLEXERIL) 5 MG tablet Take  1 tablet (5 mg total) by mouth at bedtime as needed for muscle spasms (will cause drowsiness.).  Marland Kitchen glucose blood test strip 1 each by Other route as needed for other. Use as instructed. One touch ultra blue in vitro strips  . metFORMIN (GLUCOPHAGE XR) 500 MG 24 hr tablet Take 2 tablets (1,000 mg total) by mouth daily with breakfast.  . omeprazole (PRILOSEC) 20 MG capsule Take 2 capsules (40 mg total) by mouth daily.  Glory Rosebush Delica Lancets 58W MISC by Does not apply route.  Marland Kitchen OZEMPIC, 0.25 OR 0.5 MG/DOSE, 2 MG/1.5ML SOPN INJECT 0.25MG UNDER THE SKIN ONCE A WEEK FOR 4 WEEKS THEN INCREASE TO 0.5MG A WEEK  . polyethylene glycol (MIRALAX / GLYCOLAX) 17 g packet Take 17 g by mouth daily.  . ramipril (ALTACE) 10 MG capsule Take 1 capsule (10 mg total) by mouth 2 (two) times daily.   No facility-administered encounter medications on file as of 01/30/2021.    Star Rating Drugs: Atorvastatin 20 mg last filled on 10/22/2020 for 90 day supply at [facility]. Metformin 500 mg last filled on 11/30/2020 for 90 day supply at [facility]. Ozempic 0.25 last filled on 12/28/2020 for 90 day supply at [facility]. Ramipril 10 mg last filled on 01/12/2021 for 90 day supply at [facility].  [place] Pharmacist Assistant [phone]

## 2021-01-31 ENCOUNTER — Ambulatory Visit (HOSPITAL_COMMUNITY)
Admission: RE | Admit: 2021-01-31 | Discharge: 2021-01-31 | Disposition: A | Discharge status: Home / Self Care | Payer: Medicare Other | Source: Ambulatory Visit | Attending: Family Medicine | Admitting: Family Medicine

## 2021-01-31 ENCOUNTER — Other Ambulatory Visit: Payer: Self-pay

## 2021-01-31 DIAGNOSIS — I6529 Occlusion and stenosis of unspecified carotid artery: Secondary | ICD-10-CM | POA: Diagnosis not present

## 2021-02-02 NOTE — Progress Notes (Signed)
Mild to moderate bilateral carotid arteries blockage with 1-39 % stenosis. Would like her to see Vascular for follow up if she is willing we can place referral.

## 2021-02-02 NOTE — Progress Notes (Signed)
    Mild to moderate bilateral carotid arteries blockage with 1-39 % stenosis. Would like her to see Vascular for follow up if she is willing we can place referral.

## 2021-02-03 ENCOUNTER — Ambulatory Visit (INDEPENDENT_AMBULATORY_CARE_PROVIDER_SITE_OTHER): Payer: Medicare Other

## 2021-02-03 DIAGNOSIS — E119 Type 2 diabetes mellitus without complications: Secondary | ICD-10-CM | POA: Diagnosis not present

## 2021-02-03 NOTE — Patient Instructions (Addendum)
Visit Information It was great speaking with you today!  Please let me know if you have any questions about our visit.  Goals Addressed            This Visit's Progress   . Monitor and Manage My Blood Sugar-Diabetes Type 2       Timeframe:  Long-Range Goal Priority:  High Start Date: 02/03/2021                             Expected End Date:  08/05/2021                      Follow Up Date TBD   - check blood sugar at prescribed times - check blood sugar if I feel it is too high or too low - enter blood sugar readings and medication or insulin into daily log    Why is this important?    Checking your blood sugar at home helps to keep it from getting very high or very low.   Writing the results in a diary or log helps the doctor know how to care for you.   Your blood sugar log should have the time, date and the results.   Also, write down the amount of insulin or other medicine that you take.   Other information, like what you ate, exercise done and how you were feeling, will also be helpful.     Notes:          Patient Care Plan: General Pharmacy (Adult)    Problem Identified: Hypertension, Hyperlipidemia, Diabetes and GERD   Priority: High    Long-Range Goal: Patient-Specific Goal   Start Date: 02/03/2021  Expected End Date: 08/05/2021  This Visit's Progress: On track  Priority: High  Note:   Current Barriers:  . Unable to independently afford treatment regimen  Pharmacist Clinical Goal(s):  Marland Kitchen Patient will verbalize ability to afford treatment regimen through collaboration with PharmD and provider.   Interventions: . 1:1 collaboration with [name], [name], FNP regarding development and update of comprehensive plan of care as evidenced by provider attestation and co-signature . Inter-disciplinary care team collaboration (see longitudinal plan of care) . Comprehensive medication review performed; medication list updated in electronic medical  record  Diabetes (A1c goal <8%) -Uncontrolled -Current medications: Marland Kitchen Metformin XR 500 mg 2 tablets daily  . Ozempic 0.5 mg weekly  -Medications previously tried: NA  -Patient reports significant financial concerns with Ozempic, reports the cost has been too expensive now that she is in the coverage gap.  -Current home glucose readings . fasting glucose: NA . post prandial glucose: NA -Denies hypoglycemic/hyperglycemic symptoms -Educated on Benefits of routine self-monitoring of blood sugar; -Counseled to check feet daily and get yearly eye exams -Recommended to continue current medication Assessed patient finances. Will start patient assistance application for Ozempic, and inform CPP at [facility] of need to complete the paperwork.   Patient Goals/Self-Care Activities . Patient will:  - Complete patient assistance paperwork for Ozempic and return it to PCP and CPP at new clinic for submission.  Follow Up Plan: Patient is planning on moving to previous clinic with her PCP to [facility]. Will give update to CPP at the clinic there for transition of care.        Tiffany Gregory was given information about Chronic Care Management services today including:  1. CCM service includes personalized support from designated clinical staff  supervised by her physician, including individualized plan of care and coordination with other care providers 2. 24/7 contact phone numbers for assistance for urgent and routine care needs. 3. Standard insurance, coinsurance, copays and deductibles apply for chronic care management only during months in which we provide at least 20 minutes of these services. Most insurances cover these services at 100%, however patients may be responsible for any copay, coinsurance and/or deductible if applicable. This service may help you avoid the need for more expensive face-to-face services. 4. Only one practitioner may furnish and bill the service in a  calendar month. 5. The patient may stop CCM services at any time (effective at the end of the month) by phone call to the office staff.  Patient agreed to services and verbal consent obtained.   The patient verbalized understanding of instructions, educational materials, and care plan provided today and declined offer to receive copy of patient instructions, educational materials, and care plan.   Junius Argyle, PharmD, [place] [phone]

## 2021-02-03 NOTE — Progress Notes (Signed)
Chronic Care Management Pharmacy Note  02/03/2021 Name:  Tiffany Gregory MRN:  [id number] DOB:  10/10/1950  Subjective: Tiffany Gregory is an 71 y.o. year old female who is a primary patient of [name], [name], FNP.  The CCM team was consulted for assistance with disease management and care coordination needs.    Engaged with patient by telephone for initial visit in response to provider referral for pharmacy case management and/or care coordination services.   Consent to Services:  The patient was given the following information about Chronic Care Management services today, agreed to services, and gave verbal consent: 1. CCM service includes personalized support from designated clinical staff supervised by the primary care provider, including individualized plan of care and coordination with other care providers 2. 24/7 contact phone numbers for assistance for urgent and routine care needs. 3. Service will only be billed when office clinical staff spend 20 minutes or more in a month to coordinate care. 4. Only one practitioner may furnish and bill the service in a calendar month. 5.The patient may stop CCM services at any time (effective at the end of the month) by phone call to the office staff. 6. The patient will be responsible for cost sharing (co-pay) of up to 20% of the service fee (after annual deductible is met). Patient agreed to services and consent obtained.  Patient Care Team: [name], [name], FNP as PCP - General (Family Medicine) [name], MD as Consulting Physician (Ophthalmology) [name], MD as Consulting Physician (Neurology) [name], MD as Consulting Physician (Neurosurgery) [name], MD (Dermatology) [name], MD as Consulting Physician (Gastroenterology) Germaine Pomfret, [facility] (Pharmacist)  Recent office visits: 01/24/2021 [name] FNP (PCP) - Started cyclobenzaprine 5 mg tablet PRN at bedtime, AMB  Referral to [place] 11/29/2020 Fabio Neighbors LPN (PCP Office) - No medication changes 11/29/2020 [name] FNP (PCP) - Change metformin 1000 mg  Twice daily to once daily with breakfast, Change Omeprazole from 20 mg to 40 mg daily 10/27/2020 [name] FNP (PCP) - No medication changes 08/24/2020 Alphonzo Severance CCM  Recent consult visits: 01/10/2021 Dr.Tahiliani (Gastrienterology)- No medication Changes 12/15/2020 Dr. Nehemiah Massed (Dermatology) -restarted Doxycycline 100 mg and Mupirocin oint with any flares 12/08/2020 Dr.Shah MD (Neurology) - No medication changes 11/09/2020 Dr. Manuella Ghazi MD (Neurology)  11/02/2020 Dr. Nehemiah Massed (Dermatology) -  Started Doxycycline 171m 1 po qd with food and drink x 10 days,Started Mupirocin oint bid axilla and qd to each nostril, Started Cln wash qd 10/11/2020 Dr.Tahiliani MD (Gastroenterology) - No medication changes 08/24/2020 BBenay Pillow(ophthalmology)- No medication changes  Hospital visits: Medication Reconciliation was completed by comparing discharge summary, patient's EMR and Pharmacy list, and upon discussion with patient.  Admitted to the hospital on 01/15/2021 due to Injury of head. Discharge date was 01/15/2021. [facility]   Medication Changes at Hospital Discharge: -Changed None   Medications Discontinued at Hospital Discharge: -Stopped None   Medications that remain the same after Hospital Discharge:??  -All other medications will remain the same.   Admitted to the hospital on 10/19/2020 due to Facial laceration. Discharge date was 10/19/2020. [facility]  Medication Changes at Hospital Discharge: -Changed none  Medications Discontinued at Hospital Discharge: -Stopped none   Medications  that remain the same after Hospital Discharge:??  -All other medications will remain the  same.   Objective:  Lab Results  Component Value Date   CREATININE 0.73 01/24/2021   BUN 13 01/24/2021   GFR 76.76 08/12/2020   GFRNONAA 81 10/27/2020   GFRAA 93 10/27/2020   NA 137 01/24/2021   K 4.3 01/24/2021   CALCIUM 9.6 01/24/2021   CO2 23 01/24/2021   GLUCOSE 94 01/24/2021    Lab Results  Component Value Date/Time   HGBA1C 7.6 (H) 10/27/2020 11:28 AM   HGBA1C 8.8 (H) 06/14/2020 11:17 AM   GFR 76.76 08/12/2020 08:51 AM   GFR 69.22 07/06/2020 09:40 AM   MICROALBUR <0.7 06/14/2020 11:17 AM    Last diabetic Eye exam:  Lab Results  Component Value Date/Time   HMDIABEYEEXA No Retinopathy 08/24/2020 12:00 AM    Last diabetic Foot exam: No results found for: HMDIABFOOTEX   Lab Results  Component Value Date   CHOL 139 10/27/2020   HDL 32 (L) 10/27/2020   LDLCALC 74 10/27/2020   LDLDIRECT 109.0 06/14/2020   TRIG 192 (H) 10/27/2020   CHOLHDL 5 06/14/2020    Hepatic Function Latest Ref Rng & Units 01/24/2021 10/27/2020 10/11/2020  Total Protein 6.0 - 8.5 g/dL 7.5 8.0 7.7  Albumin 3.8 - 4.8 g/dL 4.5 4.8 4.5  AST 0 - 40 IU/L 26 33 28  ALT 0 - 32 IU/L 25 32 33(H)  Alk Phosphatase 44 - 121 IU/L 76 84 80  Total Bilirubin 0.0 - 1.2 mg/dL 0.3 0.3 0.4  Bilirubin, Direct 0.00 - 0.40 mg/dL - - 0.13    Lab Results  Component Value Date/Time   TSH 1.920 10/27/2020 11:28 AM   TSH 1.88 06/14/2020 11:17 AM    CBC Latest Ref Rng & Units 01/24/2021 10/27/2020 06/14/2020  WBC 3.4 - 10.8 x10E3/uL 10.4 8.3 6.8  Hemoglobin 11.1 - 15.9 g/dL 12.4 13.0 12.8  Hematocrit 34.0 - 46.6 % 37.1 40.4 37.6  Platelets 150 - 450 x10E3/uL 320 324 277.0    Lab Results  Component Value Date/Time   VD25OH 21.95 (L) 06/14/2020 11:17 AM    Clinical ASCVD: No  The 10-year ASCVD risk score [name], et al., 2013) is: 23.2%   Values used to calculate the score:     Age: 51 years     Sex: Female     Is  Non-Hispanic African American: No     Diabetic: Yes     Tobacco smoker: No     Systolic Blood Pressure: 638 mmHg     Is BP treated: Yes     HDL Cholesterol: 32 mg/dL     Total Cholesterol: 139 mg/dL    Depression screen [facility] 2/9 11/29/2020 10/27/2020 06/14/2020  Decreased Interest 0 1 0  Down, Depressed, Hopeless 1 1 0  PHQ - 2 Score 1 2 0  Altered sleeping 1 2 0  Tired, decreased energy 2 2 1   Change in appetite 1 0 1  Feeling bad or failure about yourself  0 1 0  Trouble concentrating 0 0 0  Moving slowly or fidgety/restless 0 0 0  Suicidal thoughts 0 0 0  PHQ-9 Score 5 7 2   Difficult doing work/chores Not difficult at all Not difficult at all Somewhat difficult     Social History   Tobacco Use  Smoking Status Never Smoker  Smokeless Tobacco Never Used   BP Readings from Last 3 Encounters:  01/24/21 136/70  01/15/21 (!) 141/89  01/10/21 (!) 143/78   Pulse Readings from Last 3 Encounters:  01/24/21 71  01/15/21  87  01/10/21 78   Wt Readings from Last 3 Encounters:  01/24/21 161 lb 3.2 oz (73.1 kg)  01/15/21 157 lb (71.2 kg)  01/10/21 161 lb (73 kg)   BMI Readings from Last 3 Encounters:  01/24/21 26.83 kg/m  01/15/21 26.13 kg/m  01/10/21 26.79 kg/m    Assessment/Interventions: Review of patient past medical history, allergies, medications, health status, including review of consultants reports, laboratory and other test data, was performed as part of comprehensive evaluation and provision of chronic care management services.   SDOH:  (Social Determinants of Health) assessments and interventions performed: Yes SDOH Interventions   Flowsheet Row Most Recent Value  SDOH Interventions   Financial Strain Interventions Other (Comment)  [PAP]     SDOH Screenings   Alcohol Screen: Low Risk   . Last Alcohol Screening Score (AUDIT): 0  Depression (PHQ2-9): Medium Risk  . PHQ-2 Score: 5  Financial Resource Strain: High Risk  . Difficulty of Paying Living  Expenses: Hard  Food Insecurity: No Food Insecurity  . Worried About Charity fundraiser in the Last Year: Never true  . Ran Out of Food in the Last Year: Never true  Housing: Low Risk   . Last Housing Risk Score: 0  Physical Activity: Inactive  . Days of Exercise per Week: 0 days  . Minutes of Exercise per Session: 0 min  Social Connections: Moderately Isolated  . Frequency of Communication with Friends and Family: More than three times a week  . Frequency of Social Gatherings with Friends and Family: Never  . Attends Religious Services: Never  . Active Member of Clubs or Organizations: No  . Attends Archivist Meetings: Never  . Marital Status: Married  Stress: Stress Concern Present  . Feeling of Stress : To some extent  Tobacco Use: Low Risk   . Smoking Tobacco Use: Never Smoker  . Smokeless Tobacco Use: Never Used  Transportation Needs: No Transportation Needs  . Lack of Transportation (Medical): No  . Lack of Transportation (Non-Medical): No    CCM Care Plan  Allergies  Allergen Reactions  . Lemon Flavor Anaphylaxis    "my throat closes up"  . [name] Other (See Comments)  . Sulfa Antibiotics Other (See Comments)    Medications Reviewed Today    Reviewed by Minette Headland, CMA (Certified Medical Assistant) on 01/24/21 at [facility] List Status: <None>  Medication Order Taking? Sig Documenting Provider Last Dose Status Informant  aspirin EC 81 MG tablet [id number] Yes Take 81 mg by mouth daily. Swallow whole. [provider] Taking Active   atorvastatin (LIPITOR) 20 MG tablet [id number] Yes Take 1 tablet (20 mg total) by mouth daily. [name], [name], FNP Taking Active   Blood Glucose Monitoring Suppl (ONE TOUCH ULTRA 2) w/Device KIT [id number] Yes by Does not apply route. [provider] Taking Active   cyanocobalamin 1000 MCG tablet [id number] Yes Take 1 tablet by mouth daily. [provider] Taking Active    glucose blood test strip [id number] Yes 1 each by Other route as needed for other. Use as instructed. One touch ultra blue in vitro strips Marval Regal, NP Taking Active   metFORMIN (GLUCOPHAGE XR) 500 MG 24 hr tablet [id number] Yes Take 2 tablets (1,000 mg total) by mouth daily with breakfast. [name], [name], FNP Taking Active   omeprazole (PRILOSEC) 20 MG capsule [id number] Yes Take 2 capsules (40 mg total) by mouth daily. [name], [name], FNP Taking Active  OneTouch Delica Lancets 26V MISC [id number] Yes by Does not apply route. [provider] Taking Active   OZEMPIC, 0.25 OR 0.5 MG/DOSE, 2 MG/1.5ML SOPN [id number] Yes INJECT 0.25MG UNDER THE SKIN ONCE A WEEK FOR 4 WEEKS THEN INCREASE TO 0.5MG A WEEK [name], [name], FNP Taking Active   polyethylene glycol (MIRALAX / GLYCOLAX) 17 g packet [id number] Yes Take 17 g by mouth daily. [provider] Taking Active   ramipril (ALTACE) 10 MG capsule [id number] Yes Take 1 capsule (10 mg total) by mouth 2 (two) times daily. [name], [name], FNP Taking Active           Patient Active Problem List   Diagnosis Date Noted  . Muscle spasm 01/24/2021  . B12 deficiency 01/24/2021  . Neck pain 01/24/2021  . Fall 01/24/2021  . Gastroesophageal reflux disease 11/29/2020  . Memory loss 10/27/2020  . History of craniotomy 10/27/2020  . Family history of GERD 10/27/2020  . Hyperlipidemia 06/22/2020  . Elevated liver enzymes 06/22/2020  . History of hepatitis B virus infection 06/22/2020  . Overweight with body mass index (BMI) of 27 to 27.9 in adult 06/22/2020  . Hepatic steatosis 06/15/2020  . Encounter for medical examination to establish care 06/14/2020  . Type 2 diabetes mellitus without complication, without long-term current use of insulin ([place]) 06/14/2020  . Essential hypertension 06/14/2020  . Skin lesion 06/14/2020    Immunization History  Administered Date(s) Administered  . Fluad Quad(high Dose 65+)  07/22/2020  . Influenza-Unspecified 07/17/2018  . Moderna Sars-Covid-2 Vaccination 01/13/2020, 02/10/2020, 08/27/2020  . Pneumococcal Conjugate-13 05/15/2017  . Pneumococcal-Unspecified 10/08/2009    Conditions to be addressed/monitored:  Hypertension, Hyperlipidemia, Diabetes and GERD  Care Plan : General Pharmacy (Adult)  Updates made by Germaine Pomfret, [place] since 02/03/2021 12:00 AM    Problem: Hypertension, Hyperlipidemia, Diabetes and GERD   Priority: High    Long-Range Goal: Patient-Specific Goal   Start Date: 02/03/2021  Expected End Date: 08/05/2021  This Visit's Progress: On track  Priority: High  Note:   Current Barriers:  . Unable to independently afford treatment regimen  Pharmacist Clinical Goal(s):  Marland Kitchen Patient will verbalize ability to afford treatment regimen through collaboration with PharmD and provider.   Interventions: . 1:1 collaboration with [name], [name], FNP regarding development and update of comprehensive plan of care as evidenced by provider attestation and co-signature . Inter-disciplinary care team collaboration (see longitudinal plan of care) . Comprehensive medication review performed; medication list updated in electronic medical record  Diabetes (A1c goal <8%) -Uncontrolled -Current medications: Marland Kitchen Metformin XR 500 mg 2 tablets daily  . Ozempic 0.5 mg weekly  -Medications previously tried: NA  -Patient reports significant financial concerns with Ozempic, reports the cost has been too expensive now that she is in the coverage gap.  -Current home glucose readings . fasting glucose: NA . post prandial glucose: NA -Denies hypoglycemic/hyperglycemic symptoms -Educated on Benefits of routine self-monitoring of blood sugar; -Counseled to check feet daily and get yearly eye exams -Recommended to continue current medication Assessed patient finances. Will start patient assistance application for Ozempic, and inform CPP at [facility]  of need to complete the paperwork.   Patient Goals/Self-Care Activities . Patient will:  - Complete patient assistance paperwork for Ozempic and return it to PCP and CPP at new clinic for submission.  Follow Up Plan: Patient is planning on moving to previous clinic with her PCP to [facility]. Will give update to CPP at the clinic  there for transition of care.       Medication Assistance: Application for Ozempic  medication assistance program. in process.  Anticipated assistance start date TBD.  See plan of care for additional detail.  Patient's preferred pharmacy is:  [facility] [place], [place] - [place] [facility] [place] [address] [place] [address] Phone: [phone] Fax: [phone]  Care Plan and Follow Up Patient Decision: Patient is planning on moving to previous clinic with her PCP to [facility]. Will give update to CPP at the clinic there for transition of care.   Plan: Patient is planning on moving to previous clinic with her PCP to [facility]. Will give update to CPP at the clinic there for transition of care.   Junius Argyle, PharmD, [place] [phone]

## 2021-02-07 ENCOUNTER — Other Ambulatory Visit: Payer: Self-pay

## 2021-02-10 ENCOUNTER — Ambulatory Visit (INDEPENDENT_AMBULATORY_CARE_PROVIDER_SITE_OTHER): Payer: Medicare Other | Admitting: Gastroenterology

## 2021-02-10 DIAGNOSIS — Z23 Encounter for immunization: Secondary | ICD-10-CM

## 2021-02-14 ENCOUNTER — Ambulatory Visit (INDEPENDENT_AMBULATORY_CARE_PROVIDER_SITE_OTHER): Payer: Medicare Other | Admitting: Pharmacist

## 2021-02-14 ENCOUNTER — Other Ambulatory Visit: Payer: Self-pay

## 2021-02-14 ENCOUNTER — Encounter: Payer: Self-pay | Admitting: Adult Health

## 2021-02-14 ENCOUNTER — Ambulatory Visit (INDEPENDENT_AMBULATORY_CARE_PROVIDER_SITE_OTHER): Payer: Medicare Other | Admitting: Adult Health

## 2021-02-14 VITALS — HR 91 | Temp 97.9°F | Ht 65.0 in | Wt 159.0 lb

## 2021-02-14 DIAGNOSIS — E119 Type 2 diabetes mellitus without complications: Secondary | ICD-10-CM

## 2021-02-14 DIAGNOSIS — R0981 Nasal congestion: Secondary | ICD-10-CM

## 2021-02-14 DIAGNOSIS — E785 Hyperlipidemia, unspecified: Secondary | ICD-10-CM

## 2021-02-14 DIAGNOSIS — I10 Essential (primary) hypertension: Secondary | ICD-10-CM

## 2021-02-14 DIAGNOSIS — E538 Deficiency of other specified B group vitamins: Secondary | ICD-10-CM

## 2021-02-14 LAB — POC COVID19 BINAXNOW: SARS Coronavirus 2 Ag: NEGATIVE

## 2021-02-14 NOTE — Patient Instructions (Signed)
Allergic Rhinitis, Adult Allergic rhinitis is a reaction to allergens. Allergens are things that can cause an allergic reaction. This condition affects the lining inside the nose (mucous membrane). There are two types of allergic rhinitis:  Seasonal. This type is also called hay fever. It happens only during some times of the year.  Perennial. This type can happen at any time of the year. This condition cannot be spread from person to person (is not contagious). It can be mild, worse, or very bad. It can develop at any age and may be outgrown. What are the causes? This condition may be caused by:  Pollen from grasses, trees, and weeds.  Dust mites.  Smoke.  Mold.  Car fumes.  The pee (urine), spit, or dander of pets. Dander is dead skin cells from a pet.   What increases the risk? You are more likely to develop this condition if:  You have allergies in your family.  You have problems like allergies in your family. You may have: ? Swelling of parts of your eyes and eyelids. ? Asthma. This affects how you breathe. ? Long-term redness and swelling on your skin. ? Food allergies. What are the signs or symptoms? The main symptom of this condition is a runny or stuffy nose (nasal congestion). Other symptoms may include:  Sneezing or coughing.  Itching and tearing of your eyes.  Mucus that drips down the back of your throat (postnasal drip).  Trouble sleeping.  Feeling tired.  Headache.  Sore throat. How is this treated? There is no cure for this condition. You should avoid things that you are allergic to. Treatment can help to relieve symptoms. This may include:  Medicines that block allergy symptoms, such as corticosteroids or antihistamines. These may be given as a shot, nasal spray, or pill.  Avoiding things you are allergic to.  Medicines that give you bits of what you are allergic to over time. This is called immunotherapy. It is done if other treatments do not  help. You may get: ? Shots. ? Medicine under your tongue.  Stronger medicines, if other treatments do not help. Follow these instructions at home: Avoiding allergens Find out what things you are allergic to and avoid them. To do this, try these things:  If you get allergies any time of year: ? Replace carpet with wood, tile, or vinyl flooring. Carpet can trap pet dander and dust. ? Do not smoke. Do not allow smoking in your home. ? Change your heating and air conditioning filters at least once a month.  If you get allergies only some times of the year: ? Keep windows closed when you can. ? Plan things to do outside when pollen counts are lowest. Check pollen counts before you plan things to do outside. ? When you come indoors, change your clothes and shower before you sit on furniture or bedding.   If you are allergic to a pet: ? Keep the pet out of your bedroom. ? Vacuum, sweep, and dust often.   General instructions  Take over-the-counter and prescription medicines only as told by your doctor.  Drink enough fluid to keep your pee (urine) pale yellow.  Keep all follow-up visits as told by your doctor. This is important. Where to find more information  American Academy of Allergy, Asthma & Immunology: www.aaaai.org Contact a doctor if:  You have a fever.  You get a cough that does not go away.  You make whistling sounds when you breathe (wheeze).  Your   symptoms slow you down.  Your symptoms stop you from doing your normal things each day. Get help right away if:  You are short of breath. This symptom may be an emergency. Do not wait to see if the symptom will go away. Get medical help right away. Call your local emergency services (911 in the [place] Do not drive yourself to the hospital. Summary  Allergic rhinitis may be treated by taking medicines and avoiding things you are allergic to.  If you have allergies only some of the year, keep windows closed when you  can at those times.  Contact your doctor if you get a fever or a cough that does not go away. This information is not intended to replace advice given to you by your health care provider. Make sure you discuss any questions you have with your health care provider. Document Revised: 11/16/2019 Document Reviewed: 09/22/2019 Elsevier Patient Education  2021 [place]. Diabetes Mellitus and Nutrition, Adult When you have diabetes, or diabetes mellitus, it is very important to have healthy eating habits because your blood sugar (glucose) levels are greatly affected by what you eat and drink. Eating healthy foods in the right amounts, at about the same times every day, can help you:  Control your blood glucose.  Lower your risk of heart disease.  Improve your blood pressure.  Reach or maintain a healthy weight. What can affect my meal plan? Every person with diabetes is different, and each person has different needs for a meal plan. Your health care provider may recommend that you work with a dietitian to make a meal plan that is best for you. Your meal plan may vary depending on factors such as:  The calories you need.  The medicines you take.  Your weight.  Your blood glucose, blood pressure, and cholesterol levels.  Your activity level.  Other health conditions you have, such as heart or kidney disease. How do carbohydrates affect me? Carbohydrates, also called carbs, affect your blood glucose level more than any other type of food. Eating carbs naturally raises the amount of glucose in your blood. Carb counting is a method for keeping track of how many carbs you eat. Counting carbs is important to keep your blood glucose at a healthy level, especially if you use insulin or take certain oral diabetes medicines. It is important to know how many carbs you can safely have in each meal. This is different for every person. Your dietitian can help you calculate how many carbs you should  have at each meal and for each snack. How does alcohol affect me? Alcohol can cause a sudden decrease in blood glucose (hypoglycemia), especially if you use insulin or take certain oral diabetes medicines. Hypoglycemia can be a life-threatening condition. Symptoms of hypoglycemia, such as sleepiness, dizziness, and confusion, are similar to symptoms of having too much alcohol.  Do not drink alcohol if: ? Your health care provider tells you not to drink. ? You are pregnant, may be pregnant, or are planning to become pregnant.  If you drink alcohol: ? Do not drink on an empty stomach. ? Limit how much you use to:  0-1 drink a day for women.  0-2 drinks a day for men. ? Be aware of how much alcohol is in your drink. In the [place] one drink equals one 12 oz bottle of beer (355 mL), one 5 oz glass of wine (148 mL), or one 1 oz glass of hard liquor (44 mL). ? Keep yourself  hydrated with water, diet soda, or unsweetened iced tea.  Keep in mind that regular soda, juice, and other mixers may contain a lot of sugar and must be counted as carbs. What are tips for following this plan? Reading food labels  Start by checking the serving size on the "Nutrition Facts" label of packaged foods and drinks. The amount of calories, carbs, fats, and other nutrients listed on the label is based on one serving of the item. Many items contain more than one serving per package.  Check the total grams (g) of carbs in one serving. You can calculate the number of servings of carbs in one serving by dividing the total carbs by 15. For example, if a food has 30 g of total carbs per serving, it would be equal to 2 servings of carbs.  Check the number of grams (g) of saturated fats and trans fats in one serving. Choose foods that have a low amount or none of these fats.  Check the number of milligrams (mg) of salt (sodium) in one serving. Most people should limit total sodium intake to less than 2,300 mg per  day.  Always check the nutrition information of foods labeled as "low-fat" or "nonfat." These foods may be higher in added sugar or refined carbs and should be avoided.  Talk to your dietitian to identify your daily goals for nutrients listed on the label. Shopping  Avoid buying canned, pre-made, or processed foods. These foods tend to be high in fat, sodium, and added sugar.  Shop around the outside edge of the grocery store. This is where you will most often find fresh fruits and vegetables, bulk grains, fresh meats, and fresh dairy. Cooking  Use low-heat cooking methods, such as baking, instead of high-heat cooking methods like deep frying.  Cook using healthy oils, such as olive, canola, or sunflower oil.  Avoid cooking with butter, cream, or high-fat meats. Meal planning  Eat meals and snacks regularly, preferably at the same times every day. Avoid going long periods of time without eating.  Eat foods that are high in fiber, such as fresh fruits, vegetables, beans, and whole grains. Talk with your dietitian about how many servings of carbs you can eat at each meal.  Eat 4-6 oz (112-168 g) of lean protein each day, such as lean meat, chicken, fish, eggs, or tofu. One ounce (oz) of lean protein is equal to: ? 1 oz (28 g) of meat, chicken, or fish. ? 1 egg. ?  cup (62 g) of tofu.  Eat some foods each day that contain healthy fats, such as avocado, nuts, seeds, and fish.   What foods should I eat? Fruits Berries. Apples. Oranges. Peaches. Apricots. Plums. Grapes. Mango. Papaya. Pomegranate. Kiwi. Cherries. Vegetables Lettuce. Spinach. Leafy greens, including kale, chard, collard greens, and mustard greens. Beets. Cauliflower. Cabbage. Broccoli. Carrots. Green beans. Tomatoes. Peppers. Onions. Cucumbers. Brussels sprouts. Grains Whole grains, such as whole-wheat or whole-grain bread, crackers, tortillas, cereal, and pasta. Unsweetened oatmeal. Quinoa. Brown or wild rice. Meats  and other proteins Seafood. Poultry without skin. Lean cuts of poultry and beef. Tofu. Nuts. Seeds. Dairy Low-fat or fat-free dairy products such as milk, yogurt, and cheese. The items listed above may not be a complete list of foods and beverages you can eat. Contact a dietitian for more information. What foods should I avoid? Fruits Fruits canned with syrup. Vegetables Canned vegetables. Frozen vegetables with butter or cream sauce. Grains Refined white flour and flour products such as  bread, pasta, snack foods, and cereals. Avoid all processed foods. Meats and other proteins Fatty cuts of meat. Poultry with skin. Breaded or fried meats. Processed meat. Avoid saturated fats. Dairy Full-fat yogurt, cheese, or milk. Beverages Sweetened drinks, such as soda or iced tea. The items listed above may not be a complete list of foods and beverages you should avoid. Contact a dietitian for more information. Questions to ask a health care provider  Do I need to meet with a diabetes educator?  Do I need to meet with a dietitian?  What number can I call if I have questions?  When are the best times to check my blood glucose? Where to find more information:  American Diabetes Association: diabetes.org  Academy of Nutrition and Dietetics: www.eatright.[employer] of Diabetes and Digestive and Kidney Diseases: [employer]  Association of Diabetes Care and Education Specialists: www.diabeteseducator.org Summary  It is important to have healthy eating habits because your blood sugar (glucose) levels are greatly affected by what you eat and drink.  A healthy meal plan will help you control your blood glucose and maintain a healthy lifestyle.  Your health care provider may recommend that you work with a dietitian to make a meal plan that is best for you.  Keep in mind that carbohydrates (carbs) and alcohol have immediate effects on your blood glucose levels. It is  important to count carbs and to use alcohol carefully. This information is not intended to replace advice given to you by your health care provider. Make sure you discuss any questions you have with your health care provider. Document Revised: 09/01/2019 Document Reviewed: 09/01/2019 Elsevier Patient Education  2021 [place]. Semaglutide injection solution What is this medicine? SEMAGLUTIDE (Sem a GLOO tide) is used to improve blood sugar control in adults with type 2 diabetes. This medicine may be used with other diabetes medicines. This drug may also reduce the risk of heart attack or stroke if you have type 2 diabetes and risk factors for heart disease. This medicine may be used for other purposes; ask your health care provider or pharmacist if you have questions. COMMON BRAND NAME(S): OZEMPIC What should I tell my health care provider before I take this medicine? They need to know if you have any of these conditions:  endocrine tumors (MEN 2) or if someone in your family had these tumors  eye disease, vision problems  history of pancreatitis  kidney disease  stomach problems  thyroid cancer or if someone in your family had thyroid cancer  an unusual or allergic reaction to semaglutide, other medicines, foods, dyes, or preservatives  pregnant or trying to get pregnant  breast-feeding How should I use this medicine? This medicine is for injection under the skin of your upper leg (thigh), stomach area, or upper arm. It is given once every week (every 7 days). You will be taught how to prepare and give this medicine. Use exactly as directed. Take your medicine at regular intervals. Do not take it more often than directed. If you use this medicine with insulin, you should inject this medicine and the insulin separately. Do not mix them together. Do not give the injections right next to each other. Change (rotate) injection sites with each injection. It is important that you put  your used needles and syringes in a special sharps container. Do not put them in a trash can. If you do not have a sharps container, call your pharmacist or healthcare provider to get one. A  special MedGuide will be given to you by the pharmacist with each prescription and refill. Be sure to read this information carefully each time. This drug comes with INSTRUCTIONS FOR USE. Ask your pharmacist for directions on how to use this drug. Read the information carefully. Talk to your pharmacist or health care provider if you have questions. Talk to your pediatrician regarding the use of this medicine in children. Special care may be needed. Overdosage: If you think you have taken too much of this medicine contact a poison control center or emergency room at once. NOTE: This medicine is only for you. Do not share this medicine with others. What if I miss a dose? If you miss a dose, take it as soon as you can within 5 days after the missed dose. Then take your next dose at your regular weekly time. If it has been longer than 5 days after the missed dose, do not take the missed dose. Take the next dose at your regular time. Do not take double or extra doses. If you have questions about a missed dose, contact your health care provider for advice. What may interact with this medicine?  other medicines for diabetes Many medications may cause changes in blood sugar, these include:  alcohol containing beverages  antiviral medicines for HIV or AIDS  aspirin and aspirin-like drugs  certain medicines for blood pressure, heart disease, irregular heart beat  chromium  diuretics  female hormones, such as estrogens or progestins, birth control pills  fenofibrate  gemfibrozil  isoniazid  lanreotide  female hormones or anabolic steroids  MAOIs like Carbex, Eldepryl, Marplan, Nardil, and Parnate  medicines for weight loss  medicines for allergies, asthma, cold, or cough  medicines for depression,  anxiety, or psychotic disturbances  niacin  nicotine  NSAIDs, medicines for pain and inflammation, like ibuprofen or naproxen  octreotide  pasireotide  pentamidine  phenytoin  probenecid  quinolone antibiotics such as ciprofloxacin, levofloxacin, ofloxacin  some herbal dietary supplements  steroid medicines such as prednisone or cortisone  sulfamethoxazole; trimethoprim  thyroid hormones Some medications can hide the warning symptoms of low blood sugar (hypoglycemia). You may need to monitor your blood sugar more closely if you are taking one of these medications. These include:  beta-blockers, often used for high blood pressure or heart problems (examples include atenolol, metoprolol, propranolol)  clonidine  guanethidine  reserpine This list may not describe all possible interactions. Give your health care provider a list of all the medicines, herbs, non-prescription drugs, or dietary supplements you use. Also tell them if you smoke, drink alcohol, or use illegal drugs. Some items may interact with your medicine. What should I watch for while using this medicine? Visit your doctor or health care professional for regular checks on your progress. Drink plenty of fluids while taking this medicine. Check with your doctor or health care professional if you get an attack of severe diarrhea, nausea, and vomiting. The loss of too much body fluid can make it dangerous for you to take this medicine. A test called the HbA1C (A1C) will be monitored. This is a simple blood test. It measures your blood sugar control over the last 2 to 3 months. You will receive this test every 3 to 6 months. Learn how to check your blood sugar. Learn the symptoms of low and high blood sugar and how to manage them. Always carry a quick-source of sugar with you in case you have symptoms of low blood sugar. Examples include hard sugar  candy or glucose tablets. Make sure others know that you can choke if  you eat or drink when you develop serious symptoms of low blood sugar, such as seizures or unconsciousness. They must get medical help at once. Tell your doctor or health care professional if you have high blood sugar. You might need to change the dose of your medicine. If you are sick or exercising more than usual, you might need to change the dose of your medicine. Do not skip meals. Ask your doctor or health care professional if you should avoid alcohol. Many nonprescription cough and cold products contain sugar or alcohol. These can affect blood sugar. Pens should never be shared. Even if the needle is changed, sharing may result in passing of viruses like hepatitis or HIV. Wear a medical ID bracelet or chain, and carry a card that describes your disease and details of your medicine and dosage times. Do not become pregnant while taking this medicine. Women should inform their doctor if they wish to become pregnant or think they might be pregnant. There is a potential for serious side effects to an unborn child. Talk to your health care professional or pharmacist for more information. What side effects may I notice from receiving this medicine? Side effects that you should report to your doctor or health care professional as soon as possible:  allergic reactions like skin rash, itching or hives, swelling of the face, lips, or tongue  breathing problems  changes in vision  diarrhea that continues or is severe  lump or swelling on the neck  severe nausea  signs and symptoms of infection like fever or chills; cough; sore throat; pain or trouble passing urine  signs and symptoms of low blood sugar such as feeling anxious, confusion, dizziness, increased hunger, unusually weak or tired, sweating, shakiness, cold, irritable, headache, blurred vision, fast heartbeat, loss of consciousness  signs and symptoms of kidney injury like trouble passing urine or change in the amount of urine  trouble  swallowing  unusual stomach upset or pain  vomiting Side effects that usually do not require medical attention (report to your doctor or health care professional if they continue or are bothersome):  constipation  diarrhea  nausea  pain, redness, or irritation at site where injected  stomach upset This list may not describe all possible side effects. Call your doctor for medical advice about side effects. You may report side effects to FDA at 1-800-FDA-1088. Where should I keep my medicine? Keep out of the reach of children. Store unopened pens in a refrigerator between 2 and 8 degrees C (36 and 46 degrees F). Do not freeze. Protect from light and heat. After you first use the pen, it can be stored for 56 days at room temperature between 15 and 30 degrees C (59 and 86 degrees F) or in a refrigerator. Throw away your used pen after 56 days or after the expiration date, whichever comes first. Do not store your pen with the needle attached. If the needle is left on, medicine may leak from the pen. NOTE: This sheet is a summary. It may not cover all possible information. If you have questions about this medicine, talk to your doctor, pharmacist, or health care provider.  2021 Elsevier/Gold Standard (2019-06-09 09:41:51)

## 2021-02-14 NOTE — Chronic Care Management (AMB) (Addendum)
Chronic Care Management Pharmacy Note  02/14/2021 Name:  Tiffany Gregory MRN:  [id number] DOB:  1951-03-28  Subjective: Tiffany Gregory is an 70 y.o. year old female who is a primary patient of [name], [name], FNP.  The CCM team was consulted for assistance with disease management and care coordination needs.    Patient presented for visit today, but reported nasal congestion symptoms and had to be tested for COVID   Consent to Services:  The patient was given information about Chronic Care Management services, agreed to services, and gave verbal consent prior to initiation of services.  Please see initial visit note for detailed documentation.   Patient Care Team: [name], [name], FNP as PCP - General (Family Medicine) [name], MD as Consulting Physician (Ophthalmology) [name], MD as Consulting Physician (Neurology) [name], MD as Consulting Physician (Neurosurgery) [name], MD (Dermatology) [name], MD as Consulting Physician (Gastroenterology) Germaine Pomfret, [facility] (Pharmacist)  Recent office visits:  2/2 - PCP - continue current regimen  Recent consult visits:  4/5- GI, recommend EGD + Colonoscopy  Hospital visits:  4/10 - ED for fall.  Objective:  Lab Results  Component Value Date   CREATININE 0.73 01/24/2021   CREATININE 0.76 10/27/2020   CREATININE 0.79 08/12/2020    Lab Results  Component Value Date   HGBA1C 7.6 (H) 10/27/2020   Last diabetic Eye exam:  Lab Results  Component Value Date/Time   HMDIABEYEEXA No Retinopathy 08/24/2020 12:00 AM    Last diabetic Foot exam: No results found for: HMDIABFOOTEX      Component Value Date/Time   CHOL 139 10/27/2020 1128   TRIG 192 (H) 10/27/2020 1128   HDL 32 (L) 10/27/2020 1128   CHOLHDL 5 06/14/2020 1117   VLDL 57.2 (H) 06/14/2020 1117   LDLCALC 74 10/27/2020 1128   LDLDIRECT 109.0 06/14/2020 1117    Hepatic Function Latest Ref Rng &  Units 01/24/2021 10/27/2020 10/11/2020  Total Protein 6.0 - 8.5 g/dL 7.5 8.0 7.7  Albumin 3.8 - 4.8 g/dL 4.5 4.8 4.5  AST 0 - 40 IU/L 26 33 28  ALT 0 - 32 IU/L 25 32 33(H)  Alk Phosphatase 44 - 121 IU/L 76 84 80  Total Bilirubin 0.0 - 1.2 mg/dL 0.3 0.3 0.4  Bilirubin, Direct 0.00 - 0.40 mg/dL - - 0.13    Lab Results  Component Value Date/Time   TSH 1.920 10/27/2020 11:28 AM   TSH 1.88 06/14/2020 11:17 AM    CBC Latest Ref Rng & Units 01/24/2021 10/27/2020 06/14/2020  WBC 3.4 - 10.8 x10E3/uL 10.4 8.3 6.8  Hemoglobin 11.1 - 15.9 g/dL 12.4 13.0 12.8  Hematocrit 34.0 - 46.6 % 37.1 40.4 37.6  Platelets 150 - 450 x10E3/uL 320 324 277.0    Lab Results  Component Value Date/Time   VD25OH 21.95 (L) 06/14/2020 11:17 AM    Clinical ASCVD: No  The 10-year ASCVD risk score [name], et al., 2013) is: 23.2%   Values used to calculate the score:     Age: 89 years     Sex: Female     Is Non-Hispanic African American: No     Diabetic: Yes     Tobacco smoker: No     Systolic Blood Pressure: 007 mmHg     Is BP treated: Yes     HDL Cholesterol: 32 mg/dL     Total Cholesterol: 139 mg/dL     Social History   Tobacco Use  Smoking Status Never Smoker  Smokeless Tobacco Never Used   BP Readings from Last 3 Encounters:  01/24/21 136/70  01/15/21 (!) 141/89  01/10/21 (!) 143/78   Pulse Readings from Last 3 Encounters:  02/14/21 91  01/24/21 71  01/15/21 87   Wt Readings from Last 3 Encounters:  02/14/21 159 lb (72.1 kg)  01/24/21 161 lb 3.2 oz (73.1 kg)  01/15/21 157 lb (71.2 kg)    Assessment: Review of patient past medical history, allergies, medications, health status, including review of consultants reports, laboratory and other test data, was performed as part of comprehensive evaluation and provision of chronic care management services.   SDOH:  (Social Determinants of Health) assessments and interventions performed:    CCM Care Plan  Allergies  Allergen Reactions  .  Lemon Flavor Anaphylaxis    "my throat closes up"  . [name] Other (See Comments)  . Sulfa Antibiotics Other (See Comments)    Medications Reviewed Today    Reviewed by Minette Headland, CMA (Certified Medical Assistant) on 01/24/21 at [place] List Status: <None>  Medication Order Taking? Sig Documenting Provider Last Dose Status Informant  aspirin EC 81 MG tablet [id number] Yes Take 81 mg by mouth daily. Swallow whole. [provider] Taking Active   atorvastatin (LIPITOR) 20 MG tablet [id number] Yes Take 1 tablet (20 mg total) by mouth daily. [name], [name], FNP Taking Active   Blood Glucose Monitoring Suppl (ONE TOUCH ULTRA 2) w/Device KIT [id number] Yes by Does not apply route. [provider] Taking Active   cyanocobalamin 1000 MCG tablet [id number] Yes Take 1 tablet by mouth daily. [provider] Taking Active   glucose blood test strip [id number] Yes 1 each by Other route as needed for other. Use as instructed. One touch ultra blue in vitro strips Marval Regal, NP Taking Active   metFORMIN (GLUCOPHAGE XR) 500 MG 24 hr tablet [id number] Yes Take 2 tablets (1,000 mg total) by mouth daily with breakfast. [name], [name], FNP Taking Active   omeprazole (PRILOSEC) 20 MG capsule [id number] Yes Take 2 capsules (40 mg total) by mouth daily. [name], [name], FNP Taking Active   OneTouch Delica Lancets 64W MISC [id number] Yes by Does not apply route. [provider] Taking Active   OZEMPIC, 0.25 OR 0.5 MG/DOSE, 2 MG/1.5ML SOPN [id number] Yes INJECT 0.25MG UNDER THE SKIN ONCE A WEEK FOR 4 WEEKS THEN INCREASE TO 0.5MG A WEEK [name], [name], FNP Taking Active   polyethylene glycol (MIRALAX / GLYCOLAX) 17 g packet [id number] Yes Take 17 g by mouth daily. [provider] Taking Active   ramipril (ALTACE) 10 MG capsule [id number] Yes Take 1 capsule (10 mg total) by mouth 2 (two) times daily. [name], [name], FNP Taking  Active           Patient Active Problem List   Diagnosis Date Noted  . Muscle spasm 01/24/2021  . B12 deficiency 01/24/2021  . Neck pain 01/24/2021  . Fall 01/24/2021  . Gastroesophageal reflux disease 11/29/2020  . Memory loss 10/27/2020  . History of craniotomy 10/27/2020  . Family history of GERD 10/27/2020  . Hyperlipidemia 06/22/2020  . Elevated liver enzymes 06/22/2020  . History of hepatitis B virus infection 06/22/2020  . Overweight with body mass index (BMI) of 27 to 27.9 in adult 06/22/2020  . Hepatic steatosis 06/15/2020  . Encounter for medical examination to establish care 06/14/2020  . Type 2 diabetes mellitus without complication, without long-term current  use of insulin ([place]) 06/14/2020  . Essential hypertension 06/14/2020  . Skin lesion 06/14/2020    Immunization History  Administered Date(s) Administered  . Fluad Quad(high Dose 65+) 07/22/2020  . Hep A / Hep B 01/10/2021, 02/10/2021  . Influenza-Unspecified 07/17/2018  . Moderna Sars-Covid-2 Vaccination 01/13/2020, 02/10/2020, 08/27/2020  . Pneumococcal Conjugate-13 05/15/2017  . Pneumococcal-Unspecified 10/08/2009    Conditions to be addressed/monitored: HTN, HLD and DMII  Care Plan : Medication Management  Updates made by [name], RPH-CPP since 02/14/2021 12:00 AM    Problem: Diabetes, Hypertension     Long-Range Goal: Disease Progression Prevention   Start Date: 07/13/2020  Recent Progress: On track  Priority: High  Note:   Current Barriers:  . Concerns regarding her ability to independently afford treatment regimen . Unable to achieve control of diabetes  . Complex medical history including hx hepatitis B, history of intracranial hemorrhage   Pharmacist Clinical Goal(s):  Marland Kitchen Over the next 90 days, patient will verbalize ability to afford treatment regimen through collaboration with PharmD and provider. . Over the next 90 days, patient will achieve control of diabetes as evidenced  by improvement in A1c through collaboration with PharmD and provider  Interventions: . Inter-disciplinary care team collaboration (see longitudinal plan of care) . Comprehensive medication review performed; medication list updated in electronic medical record  Diabetes: . Uncontrolled but improved; current treatment: metformin XR 1000 mg QAM, Ozempic 0.5 mg weekly  o Hx glimepiride (d/c d/t hypoglycemia)  . Received patient portion of Ozempic patient assistance application. Will collaborate w/ CPhT to submit to [employer]. . Recommend to continue current regimen at this time  Hypertension: . Controlled; current treatment: ramipril 20 mg daily (2 10 mg tabs daily) o Previously on HCTZ, d/c d/t hyponatremia . Recommended to continue current regimen  Hyperlipidemia and ASCVD risk reduction: . Controlled; current treatment: atorvastatin 20 mg daily  . Antiplatelet therapy: aspirin 81 mg daily  . Recommended to continue current regimen at this time.  Will discuss increasing statin intensity moving forward.   GERD: . Controlled on omeprazole 40 mg daily.  . Recommend to continue current regimen at this time  Patient Goals/Self-Care Activities . Over the next 90 days, patient will:  - take medications as prescribed check blood glucose twice daily, document, and provide at future appointments  Follow Up Plan: Telephone follow up appointment with care management team member scheduled for: will collaborate w/ Care Guide to outreach for scheduling      Medication Assistance: Application for Ozempic  medication assistance program. in process.  Anticipated assistance start date TBD.  See plan of care for additional detail.  Patient's preferred pharmacy is:  [facility] [place], [place] - [place] [facility] [place] [address] [place] [address] Phone: [phone] Fax: [phone]   Follow Up:  Patient agrees  to Care Plan and Follow-up.  Plan: Telephone follow up appointment with care management team member scheduled for:  will collaborate w/ Care Guide to outreach  Catie Darnelle Maffucci, PharmD, [place], [place] Clinical Pharmacist [employer] at [facility] [phone]

## 2021-02-14 NOTE — Progress Notes (Signed)
Virtual Visit via Telephone Note  I connected with Tiffany Gregory on 02/14/21 at  9:30 AM EDT by telephone and verified that I am speaking with the correct person using two identifiers. Parties involved in visit as below:   Location: Patient: in car  Provider: Provider: Provider's office at  [facility], [place].     I discussed the limitations, risks, security and privacy concerns of performing an evaluation and management service by telephone and the availability of in person appointments. I also discussed with the patient that there may be a patient responsible charge related to this service. The patient expressed understanding and agreed to proceed.   History of Present Illness: no vitals available.  Patient is a 70 year old female in no acute distress, she has had allergy symptoms.  Dizziness has resolved. She reports she had an " icky day yesterday". She had blood glucose 112 ate a little and recheck blood sugar was 108. She felt nausea. She did have sinus headache she reports. With Ozempic, she has not feel that hungry. Weight is staying the same she reports.  Has a appointment in June.  Feels better today.  Denies any falls since her last visit 01/24/21.  Flexeril as needed for neck pain, she has seen orthopedics. He wants her to see surgeon. She has been refer for surgeon and has appointment on 03/04/21.    Observations/Objective:   Patient is alert and oriented and responsive to questions Engages in conversation with provider. Speaks in full sentences without any pauses without any shortness of breath or distress  Assessment and Plan:  The primary encounter diagnosis was Nasal congestion. Diagnoses of Type 2 diabetes mellitus without complication, without long-term current use of insulin (HCC) and B12 deficiency were also pertinent to this visit.   Orders Placed This Encounter  Procedures  . Novel Coronavirus, NAA ([facility])    Order Specific  Question:   Is this test for diagnosis or screening    Answer:   Diagnosis of ill patient    Order Specific Question:   Symptomatic for COVID-19 as defined by CDC    Answer:   Yes    Order Specific Question:   Date of Symptom Onset    Answer:   02/13/2021    Order Specific Question:   Hospitalized for COVID-19    Answer:   No    Order Specific Question:   Admitted to ICU for COVID-19    Answer:   No    Order Specific Question:   Previously tested for COVID-19    Answer:   No    Order Specific Question:   Resident in a congregate (group) care setting    Answer:   No    Order Specific Question:   Is the patient student?    Answer:   No    Order Specific Question:   Employed in healthcare setting    Answer:   No    Order Specific Question:   Pregnant    Answer:   No    Order Specific Question:   Has patient completed COVID vaccination(s) (2 doses of Pfizer/Moderna 1 dose of [employer])    Answer:   No  . Hgb A1c w/o eAG    Standing Status:   Future    Standing Expiration Date:   02/14/2022  . CBC with Differential/Platelet    Standing Status:   Future    Standing Expiration Date:   02/14/2022  .  Comprehensive metabolic panel    Standing Status:   Future    Standing Expiration Date:   02/14/2022  . B12 and Folate Panel    Standing Status:   Future    Standing Expiration Date:   02/14/2022  . POC COVID-19    Order Specific Question:   Is this test for diagnosis or screening    Answer:   Diagnosis of ill patient    Order Specific Question:   Symptomatic for COVID-19 as defined by CDC    Answer:   Yes    Order Specific Question:   Date of Symptom Onset    Answer:   02/13/2021    Order Specific Question:   Hospitalized for COVID-19    Answer:   No    Order Specific Question:   Admitted to ICU for COVID-19    Answer:   No    Order Specific Question:   Previously tested for COVID-19    Answer:   No    Order Specific Question:   Resident in a congregate (group) care setting     Answer:   No    Order Specific Question:   Employed in healthcare setting    Answer:   No    Order Specific Question:   Pregnant    Answer:   No   Labs in 3 months ordered for future. Keep follow up with neurology.   Follow Up Instructions: Advised in person evaluation at anytime is advised if any symptoms do not improve, worsen or change at any given time.  Red Flags discussed. The patient was given clear instructions to go to ER or return to medical center if any red flags develop, symptoms do not improve, worsen or new problems develop. They verbalized understanding.   I discussed the assessment and treatment plan with the patient. The patient was provided an opportunity to ask questions and all were answered. The patient agreed with the plan and demonstrated an understanding of the instructions.   The patient was advised to call back or seek an in-person evaluation if the symptoms worsen or if the condition fails to improve as anticipated.  I provided 20  minutes of non-face-to-face time during this encounter. Return in about 3 months (around 05/17/2021), or if symptoms worsen or fail to improve, for at any time for any worsening symptoms, Go to Emergency room/ urgent care if worse.  Marcille Buffy, FNP

## 2021-02-14 NOTE — Progress Notes (Signed)
Negative rapid test. covid

## 2021-02-14 NOTE — Patient Instructions (Signed)
Visit Information  PATIENT GOALS: Goals Addressed              This Visit's Progress     Patient Stated   .  Self Monitoring (pt-stated)        Patient Goals/Self-Care Activities . Over the next 90 days, patient will:  - take medications as prescribed check blood glucose twice daily, document, and provide at future appointments       Patient verbalizes understanding of instructions provided today and agrees to view in [place].   Plan: Telephone follow up appointment with care management team member scheduled for:  will collaborate w/ Care Guide to outreach  Catie Darnelle Maffucci, PharmD, [place], [place] Clinical Pharmacist [employer] at [facility] [phone]

## 2021-02-16 LAB — NOVEL CORONAVIRUS, NAA: SARS-CoV-2, NAA: NOT DETECTED

## 2021-02-16 LAB — SARS-COV-2, NAA 2 DAY TAT

## 2021-02-16 NOTE — Progress Notes (Signed)
Negative Covid  testing.

## 2021-02-17 ENCOUNTER — Telehealth: Payer: Self-pay

## 2021-02-17 NOTE — Chronic Care Management (AMB) (Signed)
  Chronic Care Management   Note  02/17/2021 Name: Tiffany Gregory MRN: [id number] DOB: 1951/05/05  Tiffany Gregory is a 70 y.o. year old female who is a primary care patient of [name], [name], FNP. I reached out to [name] by phone today in response to a referral sent by [name]'s PCP, [name], [name], FNP     [name] was given information about Chronic Care Management services today including:  1. CCM service includes personalized support from designated clinical staff supervised by her physician, including individualized plan of care and coordination with other care providers 2. 24/7 contact phone numbers for assistance for urgent and routine care needs. 3. Service will only be billed when office clinical staff spend 20 minutes or more in a month to coordinate care. 4. Only one practitioner may furnish and bill the service in a calendar month. 5. The patient may stop CCM services at any time (effective at the end of the month) by phone call to the office staff. 6. The patient will be responsible for cost sharing (co-pay) of up to 20% of the service fee (after annual deductible is met).  Patient agreed to services and verbal consent obtained.   Follow up plan: Telephone appointment with care management team member scheduled for:04/07/2021  [name], [place], [place], [place] [address] Direct Dial: [phone] [name] Website: [email or web address]

## 2021-02-20 ENCOUNTER — Telehealth: Payer: Self-pay | Admitting: Pharmacy Technician

## 2021-02-20 DIAGNOSIS — Z596 Low income: Secondary | ICD-10-CM

## 2021-02-20 NOTE — Progress Notes (Signed)
Tiffany [facility])                                            [place] Team    02/20/2021  Tiffany Gregory Mar 12, 1951 [id number]                                      Medication Assistance Referral  Referral From: [place] RPh Catie T.   Medication/Company: [name] / Novo Nordisk Patient application portion:  N/A Embedded PharmD had patient sign in clinic Provider application portion:  N/A Embedded PharmD had provider sign in clinic to [name], [place] Provider address/fax verified via: Office website    Received both patient and provider portion(s) of patient assistance application(s) for Ozmepic. Faxed completed application and required documents into [employer] on 02/21/2021.    Tiffany Verner P. [name], [place]  [phone]

## 2021-02-27 ENCOUNTER — Telehealth: Payer: Self-pay | Admitting: Pharmacy Technician

## 2021-02-27 NOTE — Progress Notes (Signed)
[facility])                                            [place] Team    02/27/2021  Tiffany Gregory September 11, 1951 [id number]  Care coordination call placed to [place] in regards to patient's application for Ozempic.  Spoke to [place] who informed patient was APPROVED 02/22/21-10/07/21. She informed medication would be delivered to the provider's office in the next 10-14 business days.  [name] P. [place] Acklin, [place]  [phone]

## 2021-03-23 ENCOUNTER — Telehealth: Payer: Self-pay

## 2021-03-23 NOTE — Telephone Encounter (Signed)
Received notification that Patients ozempic medication has arrived. We have received 5 boxes of patient Ozempic. It has been labeled for patient pick up. It has been given to patient by Catie.

## 2021-04-07 ENCOUNTER — Ambulatory Visit (INDEPENDENT_AMBULATORY_CARE_PROVIDER_SITE_OTHER): Payer: Medicare Other | Admitting: Pharmacist

## 2021-04-07 DIAGNOSIS — I10 Essential (primary) hypertension: Secondary | ICD-10-CM

## 2021-04-07 DIAGNOSIS — E119 Type 2 diabetes mellitus without complications: Secondary | ICD-10-CM

## 2021-04-07 DIAGNOSIS — E785 Hyperlipidemia, unspecified: Secondary | ICD-10-CM

## 2021-04-07 MED ORDER — METFORMIN HCL ER 500 MG PO TB24: 500.0000 mg | ORAL_TABLET | Freq: Every day | ORAL | 1 refills | Status: DC

## 2021-04-07 MED ORDER — RAMIPRIL 10 MG PO CAPS: 20.0000 mg | ORAL_CAPSULE | Freq: Every day | ORAL | 1 refills | Status: DC

## 2021-04-07 MED ORDER — OZEMPIC (1 MG/DOSE) 4 MG/3ML ~~LOC~~ SOPN: 1.0000 mg | PEN_INJECTOR | SUBCUTANEOUS | 0 refills | Status: DC

## 2021-04-07 NOTE — Chronic Care Management (AMB) (Signed)
Chronic Care Management Pharmacy Note  04/07/2021 Name:  Tiffany Gregory MRN:  [id number] DOB:  [birth year]  Subjective: Tiffany Gregory is an 70 y.o. year old female who is a primary patient of [name], [name], FNP.  The CCM team was consulted for assistance with disease management and care coordination needs. Collaborating with supervising provider, Dr. Derrel Nip, as PCP is on leave.  Engaged with patient by telephone for follow up visit in response to provider referral for pharmacy case management and/or care coordination services.   Consent to Services:  The patient was given information about Chronic Care Management services, agreed to services, and gave verbal consent prior to initiation of services.  Please see initial visit note for detailed documentation.   Patient Care Team: [name], FNP as PCP - General (Family Medicine) [name], MD as Consulting Physician (Ophthalmology) [name], MD as Consulting Physician (Neurology) [name], MD as Consulting Physician (Neurosurgery) [name], MD (Dermatology) [name], MD as Consulting Physician (Gastroenterology) [name], RPH-CPP (Pharmacist)  Recent office visits: None recently  Recent consult visits: 6/13 [facility] neurology - post traumatic cognitive impairment; referring for neurocognitive testing  Hospital visits: None in previous 6 months  Objective:  Lab Results  Component Value Date   CREATININE 0.73 01/24/2021   CREATININE 0.76 10/27/2020   CREATININE 0.79 08/12/2020    Lab Results  Component Value Date   HGBA1C 7.6 (H) 10/27/2020   Last diabetic Eye exam:  Lab Results  Component Value Date/Time   HMDIABEYEEXA No Retinopathy 08/24/2020 12:00 AM    Last diabetic Foot exam: No results found for: HMDIABFOOTEX      Component Value Date/Time   CHOL 139 10/27/2020 1128   TRIG 192 (H) 10/27/2020 1128   HDL 32 (L) 10/27/2020 1128   CHOLHDL 5  06/14/2020 1117   VLDL 57.2 (H) 06/14/2020 1117   [place] 74 10/27/2020 1128   LDLDIRECT 109.0 06/14/2020 1117    Hepatic Function Latest Ref Rng & Units 01/24/2021 10/27/2020 10/11/2020  Total Protein 6.0 - 8.5 g/dL 7.5 8.0 7.7  Albumin 3.8 - 4.8 g/dL 4.5 4.8 4.5  AST 0 - 40 IU/L 26 33 28  ALT 0 - 32 IU/L 25 32 33(H)  Alk Phosphatase 44 - 121 IU/L 76 84 80  Total Bilirubin 0.0 - 1.2 mg/dL 0.3 0.3 0.4  Bilirubin, Direct 0.00 - 0.40 mg/dL - - 0.13    Lab Results  Component Value Date/Time   TSH 1.920 10/27/2020 11:28 AM   TSH 1.88 06/14/2020 11:17 AM    CBC Latest Ref Rng & Units 01/24/2021 10/27/2020 06/14/2020  WBC 3.4 - 10.8 x10E3/uL 10.4 8.3 6.8  Hemoglobin 11.1 - 15.9 g/dL 12.4 13.0 12.8  Hematocrit 34.0 - 46.6 % 37.1 40.4 37.6  Platelets 150 - 450 x10E3/uL 320 324 277.0    Lab Results  Component Value Date/Time   VD25OH 21.95 (L) 06/14/2020 11:17 AM    Clinical ASCVD: No  The 10-year ASCVD risk score [name], et al., 2013) is: 18.9%   Values used to calculate the score:     Age: 35 years     Sex: Female     Is Non-Hispanic African American: No     Diabetic: Yes     Tobacco smoker: No     Systolic Blood Pressure: 333 mmHg     Is BP treated: Yes     HDL Cholesterol: 32 mg/dL     Total  Cholesterol: 139 mg/dL     Social History   Tobacco Use  Smoking Status Never  Smokeless Tobacco Never   BP Readings from Last 3 Encounters:  01/24/21 136/70  01/15/21 (!) 141/89  01/10/21 (!) 143/78   Pulse Readings from Last 3 Encounters:  02/14/21 91  01/24/21 71  01/15/21 87   Wt Readings from Last 3 Encounters:  02/14/21 159 lb (72.1 kg)  01/24/21 161 lb 3.2 oz (73.1 kg)  01/15/21 157 lb (71.2 kg)    Assessment: Review of patient past medical history, allergies, medications, health status, including review of consultants reports, laboratory and other test data, was performed as part of comprehensive evaluation and provision of chronic care management services.    SDOH:  (Social Determinants of Health) assessments and interventions performed:  SDOH Interventions    Flowsheet Row Most Recent Value  SDOH Interventions   Financial Strain Interventions Other (Comment)  [manufacturer assistance]       CCM Care Plan  Allergies  Allergen Reactions   Lemon Flavor Anaphylaxis    "my throat closes up"   [name] Other (See Comments)   Sulfa Antibiotics Other (See Comments)    Medications Reviewed Today     Reviewed by [name], RPH-CPP (Pharmacist) on 04/07/21 at [place] List Status: <None>   Medication Order Taking? Sig Documenting Provider Last Dose Status Informant  aspirin EC 81 MG tablet [id number] Yes Take 81 mg by mouth daily. Swallow whole. [provider] Taking Active   atorvastatin (LIPITOR) 20 MG tablet [id number] Yes Take 1 tablet (20 mg total) by mouth daily. [name], [name], FNP Taking Active   Blood Glucose Monitoring Suppl (ONE TOUCH ULTRA 2) w/Device KIT [id number] Yes by Does not apply route. [provider] Taking Active   cyanocobalamin 1000 MCG tablet [id number] Yes Take 1 tablet by mouth daily. [provider] Taking Active   cyclobenzaprine (FLEXERIL) 5 MG tablet [id number] No Take 1 tablet (5 mg total) by mouth at bedtime as needed for muscle spasms (will cause drowsiness.).  Patient not taking: Reported on 04/07/2021   [name], FNP Not Taking Active   glucose blood test strip [id number]  1 each by Other route as needed for other. Use as instructed. One touch ultra blue in vitro strips Denice Paradise A, NP  Active   meloxicam (MOBIC) 7.5 MG tablet [id number] Yes Take 7.5 mg by mouth in the morning and at bedtime. [provider] Taking Active            Med Note Darnelle Maffucci, Arville Lime   Fri Apr 07, 2021  9:53 AM) Taking once daily  metFORMIN (GLUCOPHAGE XR) 500 MG 24 hr tablet [id number] Yes Take 2 tablets (1,000 mg total) by mouth daily with breakfast.  [name], [name], FNP Taking Active            Med Note Darnelle Maffucci, Arville Lime   Fri Apr 07, 2021  9:51 AM) 500 mg daily  omeprazole (PRILOSEC) 20 MG capsule [id number] Yes Take 2 capsules (40 mg total) by mouth daily. [name], [name], FNP Taking Active            Med Note Darnelle Maffucci, Arville Lime   Fri Apr 07, 2021  9:53 AM) 20 mg daily  OneTouch Delica Lancets 26S MISC [id number] Yes by Does not apply route. [provider] Taking Active   OZEMPIC, 0.25 OR 0.5 MG/DOSE, 2 MG/1.5ML SOPN [id number] Yes INJECT 0.25MG UNDER THE SKIN ONCE A  WEEK FOR 4 WEEKS THEN INCREASE TO 0.5MG A WEEK [name], [name], FNP Taking Active   polyethylene glycol (MIRALAX / GLYCOLAX) 17 g packet [id number] Yes Take 17 g by mouth daily. [provider] Taking Active   ramipril (ALTACE) 10 MG capsule [id number] Yes Take 1 capsule (10 mg total) by mouth 2 (two) times daily. [name], [name], FNP Taking Active            Med Note [name]   Fri Apr 07, 2021  9:57 AM) 20 units QAM            Patient Active Problem List   Diagnosis Date Noted   Nasal congestion 02/14/2021   Muscle spasm 01/24/2021   B12 deficiency 01/24/2021   Neck pain 01/24/2021   Fall 01/24/2021   Gastroesophageal reflux disease 11/29/2020   Memory loss 10/27/2020   History of craniotomy 10/27/2020   Family history of GERD 10/27/2020   Hyperlipidemia 06/22/2020   Elevated liver enzymes 06/22/2020   History of hepatitis B virus infection 06/22/2020   Overweight with body mass index (BMI) of 27 to 27.9 in adult 06/22/2020   Hepatic steatosis 06/15/2020   Encounter for medical examination to establish care 06/14/2020   Type 2 diabetes mellitus without complication, without long-term current use of insulin ([place]) 06/14/2020   Essential hypertension 06/14/2020   Skin lesion 06/14/2020    Immunization History  Administered Date(s) Administered   Fluad Quad(high Dose 65+) 07/22/2020   Hep A / Hep B  01/10/2021, 02/10/2021   Influenza-Unspecified 07/17/2018   Moderna Sars-Covid-2 Vaccination 01/13/2020, 02/10/2020, 08/27/2020   Pneumococcal Conjugate-13 05/15/2017   Pneumococcal-Unspecified 10/08/2009    Conditions to be addressed/monitored: HTN and DMII  Care Plan : Medication Management  Updates made by [name], RPH-CPP since 04/07/2021 12:00 AM     Problem: Diabetes, Hypertension      Long-Range Goal: Disease Progression Prevention   Start Date: 07/13/2020  This Visit's Progress: On track  Recent Progress: On track  Priority: High  Note:   Current Barriers:  Concerns regarding her ability to independently afford treatment regimen Unable to achieve control of diabetes  Complex medical history including hx hepatitis B, history of intracranial hemorrhage   Pharmacist Clinical Goal(s):  Over the next 90 days, patient will verbalize ability to afford treatment regimen through collaboration with PharmD and provider. Over the next 90 days, patient will achieve control of diabetes as evidenced by improvement in A1c through collaboration with PharmD and provider   Interventions: 1:1 collaboration with [name], [name], FNP regarding development and update of comprehensive plan of care as evidenced by provider attestation and co-signature Inter-disciplinary care team collaboration (see longitudinal plan of care) Comprehensive medication review performed; medication list updated in electronic medical record  Health Maintenance: Due for Shingrix. Will discuss moving forward.   Diabetes: Uncontrolled but improved; current treatment: metformin XR 500 mg QAM- reduced due to stomach upset on 1000 mg, Ozempic 0.5 mg weekly  Approved for Ozempic assistance through [employer] Hx glimepiride (d/c d/t hypoglycemia)  Current glucose readings: fastings 110-160s; reports more often 130 or greater Does report constipation at baseline. Does not feel that Ozempic exacerbated  this.  Recommend to increase Ozempic to 1 mg weekly. Patient just received supply of 0.25/0.5 mg pen, will take 2 doses of 0.5 mg weekly. Will submit script for higher dose in a few weeks. Discussed to contact our office if troublesome nausea, vomiting, stomach upset, or pain Will update metformin  script to reflect current dosing.   Hypertension: Controlled per last office visit; current treatment: ramipril 20 mg daily (2 10 mg tabs daily, taking at once QAM) Previously on HCTZ, d/c d/t hyponatremia Will send updated script to reflect current dosing.  Recommended to continue current regimen  Hyperlipidemia and ASCVD risk reduction: Controlled around goal of 70; current treatment: atorvastatin 20 mg daily  Antiplatelet therapy: aspirin 81 mg daily  Recommended to continue current regimen at this time.     Hx esophageal stricture (per patient report): Controlled on omeprazole 20 mg daily - reports that she increased to 40 mg daily for a little while per PCP due to increased stomach upset. This is improved, so she reduced to 20 mg daily.  Reports hx of esophageal stricture/narrowing requiring stretching, was advised to stay on PPI therapy for life Agree to continue low dose daily PPI.   Arthritis: Moderately well controlled per patient report; current regimen: meloxicam 7.5 mg QAM (prescribed BID but only remembering to take QAM); acetaminophen 325 mg PRN, occasional ibuprofen 200 mg for headache Reviewed to avoid combining ibuprofen and meloxicam. Reviewed long term renal, CV, GI effects of NSAID therapy. Recommend more frequent use of acetaminophen over ibuprofen. She verbalizes understanding.   Supplements: Vitamin B12  Patient Goals/Self-Care Activities Over the next 90 days, patient will:  - take medications as prescribed check blood glucose twice daily, document, and provide at future appointments  Follow Up Plan: Telephone follow up appointment with care management team member  scheduled for: ~ 6 weeks     Medication Assistance:  Ozempic obtained through [employer] medication assistance program.  Enrollment ends 09/06/21  Patient's preferred pharmacy is:  [facility] [place], [place] - [address] [place] [address] Phone: [phone] Fax: [phone]   Follow Up:  Patient agrees to Care Plan and Follow-up.  Plan: Telephone follow up appointment with care management team member scheduled for:  ~ 6 weeks  Catie Darnelle Maffucci, PharmD, [place], [place] Clinical Pharmacist [employer] [facility] [employer] [phone]

## 2021-04-07 NOTE — Patient Instructions (Signed)
Visit Information  PATIENT GOALS:  Goals Addressed               This Visit's Progress     Patient Stated     Self Monitoring (pt-stated)        Patient Goals/Self-Care Activities Over the next 90 days, patient will:  - take medications as prescribed check blood glucose twice daily, document, and provide at future appointments          Patient verbalizes understanding of instructions provided today and agrees to view in [place].   Plan: Telephone follow up appointment with care management team member scheduled for:  ~ 6 weeks  Catie Darnelle Maffucci, PharmD, [place], [place] Clinical Pharmacist [employer] at [employer] [phone]

## 2021-05-26 ENCOUNTER — Ambulatory Visit (INDEPENDENT_AMBULATORY_CARE_PROVIDER_SITE_OTHER): Payer: Medicare Other | Admitting: Pharmacist

## 2021-05-26 DIAGNOSIS — E119 Type 2 diabetes mellitus without complications: Secondary | ICD-10-CM

## 2021-05-26 DIAGNOSIS — I10 Essential (primary) hypertension: Secondary | ICD-10-CM

## 2021-05-26 DIAGNOSIS — E785 Hyperlipidemia, unspecified: Secondary | ICD-10-CM

## 2021-05-26 NOTE — Chronic Care Management (AMB) (Signed)
Chronic Care Management Pharmacy Note  05/26/2021 Name:  Tiffany Gregory MRN:  [id number] DOB:  August 18, 1951   Subjective: Tiffany Gregory is an 70 y.o. year old female who is a primary patient of [name], [name], FNP.  Collaborating with covering provider Deborra Medina, MD. The CCM team was consulted for assistance with disease management and care coordination needs.    Engaged with patient by telephone for follow up visit in response to provider referral for pharmacy case management and/or care coordination services.   Consent to Services:  The patient was given information about Chronic Care Management services, agreed to services, and gave verbal consent prior to initiation of services.  Please see initial visit note for detailed documentation.   Patient Care Team: [name], FNP as PCP - General (Family Medicine) [name], MD as Consulting Physician (Ophthalmology) [name], MD as Consulting Physician (Neurology) [name], MD as Consulting Physician (Neurosurgery) [name], MD (Dermatology) [name], MD as Consulting Physician (Gastroenterology) [name], RPH-CPP (Pharmacist)    Objective:  Lab Results  Component Value Date   CREATININE 0.73 01/24/2021   CREATININE 0.76 10/27/2020   CREATININE 0.79 08/12/2020    Lab Results  Component Value Date   HGBA1C 7.6 (H) 10/27/2020   Last diabetic Eye exam:  Lab Results  Component Value Date/Time   HMDIABEYEEXA No Retinopathy 08/24/2020 12:00 AM    Last diabetic Foot exam: No results found for: HMDIABFOOTEX      Component Value Date/Time   CHOL 139 10/27/2020 1128   TRIG 192 (H) 10/27/2020 1128   HDL 32 (L) 10/27/2020 1128   CHOLHDL 5 06/14/2020 1117   VLDL 57.2 (H) 06/14/2020 1117   [place] 74 10/27/2020 1128   LDLDIRECT 109.0 06/14/2020 1117    Hepatic Function Latest Ref Rng & Units 01/24/2021 10/27/2020 10/11/2020  Total Protein 6.0 - 8.5  g/dL 7.5 8.0 7.7  Albumin 3.8 - 4.8 g/dL 4.5 4.8 4.5  AST 0 - 40 IU/L 26 33 28  ALT 0 - 32 IU/L 25 32 33(H)  Alk Phosphatase 44 - 121 IU/L 76 84 80  Total Bilirubin 0.0 - 1.2 mg/dL 0.3 0.3 0.4  Bilirubin, Direct 0.00 - 0.40 mg/dL - - 0.13    Lab Results  Component Value Date/Time   TSH 1.920 10/27/2020 11:28 AM   TSH 1.88 06/14/2020 11:17 AM    CBC Latest Ref Rng & Units 01/24/2021 10/27/2020 06/14/2020  WBC 3.4 - 10.8 x10E3/uL 10.4 8.3 6.8  Hemoglobin 11.1 - 15.9 g/dL 12.4 13.0 12.8  Hematocrit 34.0 - 46.6 % 37.1 40.4 37.6  Platelets 150 - 450 x10E3/uL 320 324 277.0    Lab Results  Component Value Date/Time   VD25OH 21.95 (L) 06/14/2020 11:17 AM    Clinical ASCVD: No  The 10-year ASCVD risk score [name], et al., 2013) is: 18.9%   Values used to calculate the score:     Age: 72 years     Sex: Female     Is Non-Hispanic African American: No     Diabetic: Yes     Tobacco smoker: No     Systolic Blood Pressure: 628 mmHg     Is BP treated: Yes     HDL Cholesterol: 32 mg/dL     Total Cholesterol: 139 mg/dL     Social History   Tobacco Use  Smoking Status Never  Smokeless Tobacco Never   BP Readings from Last 3 Encounters:  01/24/21 136/70  01/15/21 (!) 141/89  01/10/21 (!) 143/78   Pulse Readings from Last 3 Encounters:  02/14/21 91  01/24/21 71  01/15/21 87   Wt Readings from Last 3 Encounters:  02/14/21 159 lb (72.1 kg)  01/24/21 161 lb 3.2 oz (73.1 kg)  01/15/21 157 lb (71.2 kg)    Assessment: Review of patient past medical history, allergies, medications, health status, including review of consultants reports, laboratory and other test data, was performed as part of comprehensive evaluation and provision of chronic care management services.   SDOH:  (Social Determinants of Health) assessments and interventions performed:  SDOH Interventions    Flowsheet Row Most Recent Value  SDOH Interventions   Financial Strain Interventions Other (Comment)   [manufacturer assistance]       CCM Care Plan  Allergies  Allergen Reactions   Lemon Flavor Anaphylaxis    "my throat closes up"   [name] Other (See Comments)   Sulfa Antibiotics Other (See Comments)    Medications Reviewed Today     Reviewed by [name], RPH-CPP (Pharmacist) on 05/26/21 at [phone]  Med List Status: <None>   Medication Order Taking? Sig Documenting Provider Last Dose Status Informant  acetaminophen (TYLENOL) 325 MG tablet [id number] Yes Take 650 mg by mouth every 6 (six) hours as needed. [provider] Taking Active   Apoaequorin (PREVAGEN PO) [id number] Yes Take 1 tablet by mouth daily. [provider] Taking Active   aspirin EC 81 MG tablet [id number] Yes Take 81 mg by mouth daily. Swallow whole. [provider] Taking Active   atorvastatin (LIPITOR) 20 MG tablet [id number] Yes Take 1 tablet (20 mg total) by mouth daily. [name], [name], FNP Taking Active   Blood Glucose Monitoring Suppl (ONE TOUCH ULTRA 2) w/Device KIT [id number] Yes by Does not apply route. [provider] Taking Active   cyanocobalamin 1000 MCG tablet [id number] Yes Take 1 tablet by mouth daily. [provider] Taking Active   cyclobenzaprine (FLEXERIL) 5 MG tablet [id number] No Take 1 tablet (5 mg total) by mouth at bedtime as needed for muscle spasms (will cause drowsiness.).  Patient not taking: Reported on 05/26/2021   [name], FNP Not Taking Active   glucose blood test strip [id number] Yes 1 each by Other route as needed for other. Use as instructed. One touch ultra blue in vitro strips Marval Regal, NP Taking Active   meloxicam (MOBIC) 7.5 MG tablet [id number] Yes Take 7.5 mg by mouth in the morning and at bedtime. [provider] Taking Active            Med Note Darnelle Maffucci, Arville Lime   Fri Apr 07, 2021  9:53 AM) Taking once daily  metFORMIN (GLUCOPHAGE XR) 500 MG 24 hr tablet [id number] Yes Take 1  tablet (500 mg total) by mouth daily with breakfast. [name], MD Taking Active   omeprazole (PRILOSEC) 20 MG capsule [id number] Yes Take 2 capsules (40 mg total) by mouth daily. [name], [name], FNP Taking Active            Med Note Darnelle Maffucci, Arville Lime   Fri Apr 07, 2021  9:53 AM) 20 mg daily  OneTouch Delica Lancets 76L MISC [id number] Yes by Does not apply route. [provider] Taking Active   polyethylene glycol (MIRALAX / GLYCOLAX) 17 g packet [id number] Yes Take 17 g by mouth daily. [provider] Taking Active   ramipril (ALTACE) 10 MG capsule [id number] Yes Take  2 capsules (20 mg total) by mouth daily. [name], MD Taking Active   Semaglutide, 1 MG/DOSE, (OZEMPIC, 1 MG/DOSE,) 4 MG/3ML SOPN [id number] Yes Inject 1 mg into the skin once a week. [name], MD Taking Active   vitamin E 180 MG (400 UNITS) capsule [id number] Yes Take 400 Units by mouth daily. [provider] Taking Active             Patient Active Problem List   Diagnosis Date Noted   Nasal congestion 02/14/2021   Muscle spasm 01/24/2021   B12 deficiency 01/24/2021   Neck pain 01/24/2021   Fall 01/24/2021   Gastroesophageal reflux disease 11/29/2020   Memory loss 10/27/2020   History of craniotomy 10/27/2020   Family history of GERD 10/27/2020   Hyperlipidemia 06/22/2020   Elevated liver enzymes 06/22/2020   History of hepatitis B virus infection 06/22/2020   Overweight with body mass index (BMI) of 27 to 27.9 in adult 06/22/2020   Hepatic steatosis 06/15/2020   Encounter for medical examination to establish care 06/14/2020   Type 2 diabetes mellitus without complication, without long-term current use of insulin ([place]) 06/14/2020   Essential hypertension 06/14/2020   Skin lesion 06/14/2020    Immunization History  Administered Date(s) Administered   Fluad Quad(high Dose 65+) 07/22/2020   Hep A / Hep B 01/10/2021, 02/10/2021   Influenza-Unspecified 07/17/2018    Moderna Sars-Covid-2 Vaccination 01/13/2020, 02/10/2020, 08/27/2020   Pneumococcal Conjugate-13 05/15/2017   Pneumococcal-Unspecified 10/08/2009    Conditions to be addressed/monitored: HTN and DMII  Care Plan : Medication Management  Updates made by [name], RPH-CPP since 05/26/2021 12:00 AM     Problem: Diabetes, Hypertension      Long-Range Goal: Disease Progression Prevention   Start Date: 07/13/2020  This Visit's Progress: On track  Recent Progress: On track  Priority: High  Note:   Current Barriers:  Concerns regarding her ability to independently afford treatment regimen Unable to achieve control of diabetes  Complex medical history including hx hepatitis B, history of intracranial hemorrhage   Pharmacist Clinical Goal(s):  Over the next 90 days, patient will verbalize ability to afford treatment regimen through collaboration with PharmD and provider. Over the next 90 days, patient will achieve control of diabetes as evidenced by improvement in A1c through collaboration with PharmD and provider   Interventions: 1:1 collaboration with [name], [name], FNP regarding development and update of comprehensive plan of care as evidenced by provider attestation and co-signature Inter-disciplinary care team collaboration (see longitudinal plan of care) Comprehensive medication review performed; medication list updated in electronic medical record  Health Maintenance: Due for Shingrix, pneumonia. Will discuss moving forward.   SDOH: Reports her husband's health continues to decline (CHF, COPD, possible cancer). She is primary caregiver and wishes she had a support group. Will collaborate w/ LCSW on any options/support.   Diabetes: Uncontrolled but improved; current treatment: metformin XR 500 mg QAM, Ozempic 1 mg weekly Reports she has reached her goal pre-rehab weight. Some constipation. Uses miralax daily.  Approved for Ozempic assistance through [employer] through 2022 Hx glimepiride (d/c d/t hypoglycemia)  Current glucose readings: fastings 110-120s; highest reading 126 Recommend to continue current regimen at this time. Will send updated prescription for Ozempic 1 mg pen strength to [facility].   Hypertension: Controlled per last office visit; current treatment: ramipril 20 mg daily Previously on HCTZ, d/c d/t hyponatremia Recommended to continue current regimen  Hyperlipidemia and ASCVD risk reduction: Controlled around goal  of 70; current treatment: atorvastatin 20 mg daily  Antiplatelet therapy: aspirin 81 mg daily  Recommended to continue current regimen at this time.     Hx esophageal stricture (per patient report): Controlled per patient report; current regimen: omeprazole 20 mg daily;  Reports hx of esophageal stricture/narrowing requiring stretching, was advised to stay on PPI therapy for life Recommended to continue current regimen  Arthritis: Moderately well controlled per patient report; current regimen: meloxicam 7.5 mg QAM (prescribed BID but only remembering to take QAM); acetaminophen 325 mg PRN, occasional ibuprofen 200 mg for headache Previously reviewed to avoid combining ibuprofen and meloxicam due to long term renal, CV, GI effects of NSAID therapy. Previously recommended to continue current regimen   Supplements: Vitamin B12, Vitamin E - reports she started this to help with skin. Reviewed risks of high dose Vitamin E  Patient Goals/Self-Care Activities Over the next 90 days, patient will:  - take medications as prescribed check blood glucose twice daily, document, and provide at future appointments  Follow Up Plan: Telephone follow up appointment with care management team member scheduled for: ~ 12 weeks     Medication Assistance:  Ozempic obtained through [employer] medication assistance program.  Enrollment ends 09/06/21  Patient's preferred pharmacy is:  [facility] [place], [place] [address] [place] [address] Phone: [phone] Fax: [phone]    Follow Up:  Patient agrees to Care Plan and Follow-up.  Plan: Telephone follow up appointment with care management team member scheduled for:  12 weeks  Catie Darnelle Maffucci, PharmD, [place], [place] Clinical Pharmacist [employer] at [employer] [phone]

## 2021-05-26 NOTE — Patient Instructions (Signed)
Visit Information  PATIENT GOALS:  Goals Addressed               This Visit's Progress     Patient Stated     Self Monitoring (pt-stated)        Patient Goals/Self-Care Activities Over the next 90 days, patient will:  - take medications as prescribed check blood glucose twice daily, document, and provide at future appointments         Patient verbalizes understanding of instructions provided today and agrees to view in [place].   Plan: Telephone follow up appointment with care management team member scheduled for:  12 weeks  Catie Darnelle Maffucci, PharmD, [place], [place] Clinical Pharmacist [employer] at [employer] [phone]

## 2021-05-29 ENCOUNTER — Telehealth: Payer: Self-pay

## 2021-05-29 NOTE — Chronic Care Management (AMB) (Signed)
  Chronic Care Management   Note  05/29/2021 Name: Tiffany Gregory MRN: [id number] DOB: 1950-12-29  Tiffany Gregory is a 70 y.o. year old female who is a primary care patient of [name], [name], FNP. Tiffany Gregory is currently enrolled in care management services. An additional referral for LCSW  was placed.   Follow up plan: Telephone appointment with care management team member scheduled for:06/08/2021  [name], [place], [place], [place] [address] Direct Dial: [phone] [email or web address]: [email or web address]

## 2021-05-29 NOTE — Chronic Care Management (AMB) (Signed)
  Chronic Care Management   Note  05/29/2021 Name: Tiffany Gregory MRN: [id number] DOB: [birth year]  Tiffany Gregory is a 70 y.o. year old female who is a primary care patient of [name], [name], FNP. Tiffany Gregory is currently enrolled in care management services. An additional referral for LCSW was placed.   Follow up plan: Unsuccessful telephone outreach attempt made. A HIPAA compliant phone message was left for the patient providing contact information and requesting a return call.  The care management team will reach out to the patient again over the next 5 days.  If patient returns call to provider office, please advise to call [facility]  at [place], [place], [place], [place] [address] Direct Dial: [phone] [email or web address]: [email or web address]

## 2021-06-07 ENCOUNTER — Ambulatory Visit: Payer: Medicare Other | Admitting: Pharmacist

## 2021-06-07 DIAGNOSIS — E119 Type 2 diabetes mellitus without complications: Secondary | ICD-10-CM | POA: Diagnosis not present

## 2021-06-07 DIAGNOSIS — E785 Hyperlipidemia, unspecified: Secondary | ICD-10-CM

## 2021-06-07 DIAGNOSIS — I10 Essential (primary) hypertension: Secondary | ICD-10-CM

## 2021-06-07 NOTE — Chronic Care Management (AMB) (Signed)
Chronic Care Management Pharmacy Note  06/07/2021 Name:  Tiffany Gregory MRN:  [id number] DOB:  04/21/1951   Subjective: Tiffany Gregory is an 70 y.o. year old female who is a primary patient of [name], [name], FNP.  Collaborating with covering provider Deborra Medina, MD while PCP is on leave. The CCM team was consulted for assistance with disease management and care coordination needs.    Care coordination for follow up visit in response to provider referral for pharmacy case management and/or care coordination services.   Consent to Services:  The patient was given information about Chronic Care Management services, agreed to services, and gave verbal consent prior to initiation of services.  Please see initial visit note for detailed documentation.   Patient Care Team: [name], FNP as PCP - General (Family Medicine) [name], MD as Consulting Physician (Ophthalmology) [name], MD as Consulting Physician (Neurology) [name], MD as Consulting Physician (Neurosurgery) [name], MD (Dermatology) [name], MD as Consulting Physician (Gastroenterology) [name], RPH-CPP (Pharmacist) Francis Gaines, LCSW as [place] Management  Objective:  Lab Results  Component Value Date   CREATININE 0.73 01/24/2021   CREATININE 0.76 10/27/2020   CREATININE 0.79 08/12/2020    Lab Results  Component Value Date   HGBA1C 7.6 (H) 10/27/2020   Last diabetic Eye exam:  Lab Results  Component Value Date/Time   HMDIABEYEEXA No Retinopathy 08/24/2020 12:00 AM    Last diabetic Foot exam: No results found for: HMDIABFOOTEX      Component Value Date/Time   CHOL 139 10/27/2020 1128   TRIG 192 (H) 10/27/2020 1128   HDL 32 (L) 10/27/2020 1128   CHOLHDL 5 06/14/2020 1117   VLDL 57.2 (H) 06/14/2020 1117   [place] 74 10/27/2020 1128   LDLDIRECT 109.0 06/14/2020 1117    Hepatic Function Latest  Ref Rng & Units 01/24/2021 10/27/2020 10/11/2020  Total Protein 6.0 - 8.5 g/dL 7.5 8.0 7.7  Albumin 3.8 - 4.8 g/dL 4.5 4.8 4.5  AST 0 - 40 IU/L 26 33 28  ALT 0 - 32 IU/L 25 32 33(H)  Alk Phosphatase 44 - 121 IU/L 76 84 80  Total Bilirubin 0.0 - 1.2 mg/dL 0.3 0.3 0.4  Bilirubin, Direct 0.00 - 0.40 mg/dL - - 0.13    Lab Results  Component Value Date/Time   TSH 1.920 10/27/2020 11:28 AM   TSH 1.88 06/14/2020 11:17 AM    CBC Latest Ref Rng & Units 01/24/2021 10/27/2020 06/14/2020  WBC 3.4 - 10.8 x10E3/uL 10.4 8.3 6.8  Hemoglobin 11.1 - 15.9 g/dL 12.4 13.0 12.8  Hematocrit 34.0 - 46.6 % 37.1 40.4 37.6  Platelets 150 - 450 x10E3/uL 320 324 277.0    Lab Results  Component Value Date/Time   VD25OH 21.95 (L) 06/14/2020 11:17 AM    Clinical ASCVD: No  The 10-year ASCVD risk score [name], et al., 2013) is: 18.9%   Values used to calculate the score:     Age: 30 years     Sex: Female     Is Non-Hispanic African American: No     Diabetic: Yes     Tobacco smoker: No     Systolic Blood Pressure: 004 mmHg     Is BP treated: Yes     HDL Cholesterol: 32 mg/dL     Total Cholesterol: 139 mg/dL    Social History   Tobacco Use  Smoking Status Never  Smokeless Tobacco Never  BP Readings from Last 3 Encounters:  01/24/21 136/70  01/15/21 (!) 141/89  01/10/21 (!) 143/78   Pulse Readings from Last 3 Encounters:  02/14/21 91  01/24/21 71  01/15/21 87   Wt Readings from Last 3 Encounters:  02/14/21 159 lb (72.1 kg)  01/24/21 161 lb 3.2 oz (73.1 kg)  01/15/21 157 lb (71.2 kg)    Assessment: Review of patient past medical history, allergies, medications, health status, including review of consultants reports, laboratory and other test data, was performed as part of comprehensive evaluation and provision of chronic care management services.   SDOH:  (Social Determinants of Health) assessments and interventions performed:    CCM Care Plan  Allergies  Allergen Reactions   Lemon  Flavor Anaphylaxis    "my throat closes up"   [name] Other (See Comments)   Sulfa Antibiotics Other (See Comments)    Medications Reviewed Today     Reviewed by [name], RPH-CPP (Pharmacist) on 05/26/21 at [phone]  Med List Status: <None>   Medication Order Taking? Sig Documenting Provider Last Dose Status Informant  acetaminophen (TYLENOL) 325 MG tablet [id number] Yes Take 650 mg by mouth every 6 (six) hours as needed. [provider] Taking Active   Apoaequorin (PREVAGEN PO) [id number] Yes Take 1 tablet by mouth daily. [provider] Taking Active   aspirin EC 81 MG tablet [id number] Yes Take 81 mg by mouth daily. Swallow whole. [provider] Taking Active   atorvastatin (LIPITOR) 20 MG tablet [id number] Yes Take 1 tablet (20 mg total) by mouth daily. [name], [name], FNP Taking Active   Blood Glucose Monitoring Suppl (ONE TOUCH ULTRA 2) w/Device KIT [id number] Yes by Does not apply route. [provider] Taking Active   cyanocobalamin 1000 MCG tablet [id number] Yes Take 1 tablet by mouth daily. [provider] Taking Active   cyclobenzaprine (FLEXERIL) 5 MG tablet [id number] No Take 1 tablet (5 mg total) by mouth at bedtime as needed for muscle spasms (will cause drowsiness.).  Patient not taking: Reported on 05/26/2021   [name], FNP Not Taking Active   glucose blood test strip [id number] Yes 1 each by Other route as needed for other. Use as instructed. One touch ultra blue in vitro strips Marval Regal, NP Taking Active   meloxicam (MOBIC) 7.5 MG tablet [id number] Yes Take 7.5 mg by mouth in the morning and at bedtime. [provider] Taking Active            Med Note Darnelle Maffucci, Arville Lime   Fri Apr 07, 2021  9:53 AM) Taking once daily  metFORMIN (GLUCOPHAGE XR) 500 MG 24 hr tablet [id number] Yes Take 1 tablet (500 mg total) by mouth daily with breakfast. [name], MD Taking Active    omeprazole (PRILOSEC) 20 MG capsule [id number] Yes Take 2 capsules (40 mg total) by mouth daily. [name], [name], FNP Taking Active            Med Note Darnelle Maffucci, Arville Lime   Fri Apr 07, 2021  9:53 AM) 20 mg daily  OneTouch Delica Lancets 81E MISC [id number] Yes by Does not apply route. [provider] Taking Active   polyethylene glycol (MIRALAX / GLYCOLAX) 17 g packet [id number] Yes Take 17 g by mouth daily. [provider] Taking Active   ramipril (ALTACE) 10 MG capsule [id number] Yes Take 2 capsules (20 mg total) by mouth daily. [name], MD Taking Active   Semaglutide, 1 MG/DOSE, (OZEMPIC,  1 MG/DOSE,) 4 MG/3ML SOPN [id number] Yes Inject 1 mg into the skin once a week. [name], MD Taking Active   vitamin E 180 MG (400 UNITS) capsule [id number] Yes Take 400 Units by mouth daily. [provider] Taking Active             Patient Active Problem List   Diagnosis Date Noted   Nasal congestion 02/14/2021   Muscle spasm 01/24/2021   B12 deficiency 01/24/2021   Neck pain 01/24/2021   Fall 01/24/2021   Gastroesophageal reflux disease 11/29/2020   Memory loss 10/27/2020   History of craniotomy 10/27/2020   Family history of GERD 10/27/2020   Hyperlipidemia 06/22/2020   Elevated liver enzymes 06/22/2020   History of hepatitis B virus infection 06/22/2020   Overweight with body mass index (BMI) of 27 to 27.9 in adult 06/22/2020   Hepatic steatosis 06/15/2020   Encounter for medical examination to establish care 06/14/2020   Type 2 diabetes mellitus without complication, without long-term current use of insulin ([place]) 06/14/2020   Essential hypertension 06/14/2020   Skin lesion 06/14/2020    Immunization History  Administered Date(s) Administered   Fluad Quad(high Dose 65+) 07/22/2020   Hep A / Hep B 01/10/2021, 02/10/2021   Influenza-Unspecified 07/17/2018   Moderna Sars-Covid-2 Vaccination 01/13/2020, 02/10/2020, 08/27/2020   Pneumococcal  Conjugate-13 05/15/2017   Pneumococcal-Unspecified 10/08/2009    Conditions to be addressed/monitored: HTN and DMII  Care Plan : Medication Management  Updates made by [name], RPH-CPP since 06/07/2021 12:00 AM     Problem: Diabetes, Hypertension      Long-Range Goal: Disease Progression Prevention   Start Date: 07/13/2020  Recent Progress: On track  Priority: High  Note:   Current Barriers:  Concerns regarding her ability to independently afford treatment regimen Unable to achieve control of diabetes  Complex medical history including hx hepatitis B, history of intracranial hemorrhage   Pharmacist Clinical Goal(s):  Over the next 90 days, patient will verbalize ability to afford treatment regimen through collaboration with PharmD and provider. Over the next 90 days, patient will achieve control of diabetes as evidenced by improvement in A1c through collaboration with PharmD and provider   Interventions: 1:1 collaboration with [name], [name], FNP regarding development and update of comprehensive plan of care as evidenced by provider attestation and co-signature Inter-disciplinary care team collaboration (see longitudinal plan of care) Comprehensive medication review performed; medication list updated in electronic medical record  Health Maintenance: Due for Shingrix, pneumonia. Will discuss moving forward.   SDOH: Reports her husband's health continues to decline (CHF, COPD, possible cancer). She is primary caregiver and wishes she had a support group. LCSW call upcoming   Diabetes: Uncontrolled but improved; current treatment: metformin XR 500 mg QAM, Ozempic 1 mg weekly Approved for Ozempic assistance through [employer] through 2022 Hx glimepiride (d/c d/t hypoglycemia)  Contacted [facility] [facility] to follow up on the order for Ozempic 1 mg. Processed 06/02/21, can take 10-14 business days to ship to our office, but possibly even longer due to [place] back  order. Follow for access issues.   Hypertension: Controlled per last office visit; current treatment: ramipril 20 mg daily Previously on HCTZ, d/c d/t hyponatremia Previously recommended to continue current regimen  Hyperlipidemia and ASCVD risk reduction: Controlled around goal of 70; current treatment: atorvastatin 20 mg daily  Antiplatelet therapy: aspirin 81 mg daily  Previously recommended to continue current regimen at this time.    Hx esophageal stricture (per patient report):  Controlled per patient report; current regimen: omeprazole 20 mg daily;  Reports hx of esophageal stricture/narrowing requiring stretching, was advised to stay on PPI therapy for life Previously recommended to continue current regimen  Arthritis: Moderately well controlled per patient report; current regimen: meloxicam 7.5 mg QAM (prescribed BID but only remembering to take QAM); acetaminophen 325 mg PRN, occasional ibuprofen 200 mg for headache Previously reviewed to avoid combining ibuprofen and meloxicam due to long term renal, CV, GI effects of NSAID therapy. Previously recommended to continue current regimen   Supplements: Vitamin B12, Vitamin E - reports she started this to help with skin. Reviewed risks of high dose Vitamin E  Patient Goals/Self-Care Activities Over the next 90 days, patient will:  - take medications as prescribed check blood glucose twice daily, document, and provide at future appointments  Follow Up Plan: Telephone follow up appointment with care management team member scheduled for: ~ 10 weeks as previously scheduled     Medication Assistance:  Ozempic obtained through [employer] medication assistance program.  Enrollment ends 09/06/21  Patient's preferred pharmacy is:  [facility] [place], [place] - [address] [place] [address] Phone: [phone] Fax:  [phone]   Follow Up:  Patient agrees to Care Plan and Follow-up.  Plan: Telephone follow up appointment with care management team member scheduled for:  10 weeks as previously scheduled  Catie Darnelle Maffucci, PharmD, [place], [place] Clinical Pharmacist [employer] at [employer] [phone]

## 2021-06-07 NOTE — Patient Instructions (Signed)
Visit Information  PATIENT GOALS:  Goals Addressed               This Visit's Progress     Patient Stated     Self Monitoring (pt-stated)        Patient Goals/Self-Care Activities Over the next 90 days, patient will:  - take medications as prescribed check blood glucose twice daily, document, and provide at future appointments        Patient verbalizes understanding of instructions provided today and agrees to view in [place].   Plan: Telephone follow up appointment with care management team member scheduled for:  10 weeks as previously scheduled  Catie Darnelle Maffucci, PharmD, [place], [place] Clinical Pharmacist [employer] at [employer] [phone]

## 2021-06-08 ENCOUNTER — Ambulatory Visit (INDEPENDENT_AMBULATORY_CARE_PROVIDER_SITE_OTHER): Payer: Medicare Other | Admitting: *Deleted

## 2021-06-08 DIAGNOSIS — K760 Fatty (change of) liver, not elsewhere classified: Secondary | ICD-10-CM

## 2021-06-08 DIAGNOSIS — E119 Type 2 diabetes mellitus without complications: Secondary | ICD-10-CM

## 2021-06-08 DIAGNOSIS — W19XXXD Unspecified fall, subsequent encounter: Secondary | ICD-10-CM

## 2021-06-08 DIAGNOSIS — I10 Essential (primary) hypertension: Secondary | ICD-10-CM

## 2021-06-08 DIAGNOSIS — R413 Other amnesia: Secondary | ICD-10-CM

## 2021-06-08 DIAGNOSIS — Z9889 Other specified postprocedural states: Secondary | ICD-10-CM

## 2021-06-09 NOTE — Patient Instructions (Signed)
Visit Information   PATIENT GOALS:   Goals Addressed               This Visit's Progress     Find Caregiver Support and Counseling Services in My Community. (pt-stated)   On track     Timeframe:  Short-Term Goal Priority:  High Start Date:  06/08/2021                         Expected End Date: 08/08/2021                    Follow-Up Date: 06/16/2021 at 10:00am  Patient Goals/Self-Care Activities: Begin personal counseling with LCSW on a weekly/bi-weekly basis, to reduce and manage symptoms of Anxiety and Caregiver Stress, until established with [facility]. Accept all calls from representative with [place] in an effort to establish ongoing mental health counseling and supportive services. Incorporate into daily practice - relaxation techniques, deep breathing exercises and mindfulness meditation strategies. Talk about feelings with a friend, family member, or spiritual advisor. Continue with compliance of taking prescription medications, try to obtain adequate rest, stay well-hydrated and eat a healthy, well-balanced diet. Review list of Caregiver Support Groups and Self-Enroll in a Caregiver Support Group of interest, notifying LCSW directly (# [phone]) if you have questions or need assistance with the referral process.        Consent to CCM Services: Tiffany Gregory was given information about Chronic Care Management services including:  CCM service includes personalized support from designated clinical staff supervised by her physician, including individualized plan of care and coordination with other care providers 24/7 contact phone numbers for assistance for urgent and routine care needs. Service will only be billed when office clinical staff spend 20 minutes or more in a month to coordinate care. Only one practitioner may furnish and bill the service in a calendar month. The patient may stop CCM services at any time (effective at the end of the month) by phone  call to the office staff. The patient will be responsible for cost sharing (co-pay) of up to 20% of the service fee (after annual deductible is met).  Patient agreed to services and verbal consent obtained.   Patient verbalizes understanding of instructions provided today and agrees to view in [place].   Telephone follow-up appointment with care management team member scheduled for:  06/16/2021 at 10:00am  Nat Christen LCSW Licensed Clinical Social Worker [place]  [phone]   CLINICAL CARE PLAN: Patient Care Plan: Medication Management     Problem Identified: Diabetes, Hypertension      Long-Range Goal: Disease Progression Prevention   Start Date: 07/13/2020  Recent Progress: On track  Priority: High  Note:   Current Barriers:  Concerns regarding her ability to independently afford treatment regimen Unable to achieve control of diabetes  Complex medical history including hx hepatitis B, history of intracranial hemorrhage   Pharmacist Clinical Goal(s):  Over the next 90 days, patient will verbalize ability to afford treatment regimen through collaboration with PharmD and provider. Over the next 90 days, patient will achieve control of diabetes as evidenced by improvement in A1c through collaboration with PharmD and provider   Interventions: 1:1 collaboration with [name], [name], FNP regarding development and update of comprehensive plan of care as evidenced by provider attestation and co-signature Inter-disciplinary care team collaboration (see longitudinal plan of care) Comprehensive medication review performed; medication list updated in electronic medical record  Health Maintenance: Due  for Shingrix, pneumonia. Will discuss moving forward.   SDOH: Reports her husband's health continues to decline (CHF, COPD, possible cancer). She is primary caregiver and wishes she had a support group. LCSW call upcoming   Diabetes: Uncontrolled but improved;  current treatment: metformin XR 500 mg QAM, Ozempic 1 mg weekly Approved for Ozempic assistance through [employer] through 2022 Hx glimepiride (d/c d/t hypoglycemia)  Contacted [facility] [facility] to follow up on the order for Ozempic 1 mg. Processed 06/02/21, can take 10-14 business days to ship to our office, but possibly even longer due to [place] back order. Follow for access issues.   Hypertension: Controlled per last office visit; current treatment: ramipril 20 mg daily Previously on HCTZ, d/c d/t hyponatremia Previously recommended to continue current regimen  Hyperlipidemia and ASCVD risk reduction: Controlled around goal of 70; current treatment: atorvastatin 20 mg daily  Antiplatelet therapy: aspirin 81 mg daily  Previously recommended to continue current regimen at this time.    Hx esophageal stricture (per patient report): Controlled per patient report; current regimen: omeprazole 20 mg daily;  Reports hx of esophageal stricture/narrowing requiring stretching, was advised to stay on PPI therapy for life Previously recommended to continue current regimen  Arthritis: Moderately well controlled per patient report; current regimen: meloxicam 7.5 mg QAM (prescribed BID but only remembering to take QAM); acetaminophen 325 mg PRN, occasional ibuprofen 200 mg for headache Previously reviewed to avoid combining ibuprofen and meloxicam due to long term renal, CV, GI effects of NSAID therapy. Previously recommended to continue current regimen   Supplements: Vitamin B12, Vitamin E - reports she started this to help with skin. Reviewed risks of high dose Vitamin E  Patient Goals/Self-Care Activities Over the next 90 days, patient will:  - take medications as prescribed check blood glucose twice daily, document, and provide at future appointments  Follow Up Plan: Telephone follow up appointment with care management team member scheduled for: ~ 10 weeks as previously scheduled    Patient  Care Plan: LCSW Plan of Care     Problem Identified: Find Caregiver Support and Counseling Services in My Community.   Priority: High     Goal: Find [employer] and Counseling Services in My Community.   Start Date: 06/08/2021  Expected End Date: 08/08/2021  This Visit's Progress: On track  Priority: High  Note:   Current Barriers:   Acute Mental Health needs related to Anxiety and Caregiver Stress, requires Support, Education, Resources, Referrals and Care Coordination in order to meet unmet mental health needs. Clinical Goal(s):  Patient will work with LCSW to reduce and manage symptoms of Anxiety and Caregiver Stress, until established with a community provider and enrolled in a Caregiver Support Group.   Patient will increase knowledge and/or ability of:        Coping Skills, Healthy Habits, Self-Management Skills, Stress Reduction, Home Safety and Utilizing [employer] and Resources.   Clinical Interventions:  Assessed patient's previous treatment, needs, coping skills, current treatment, support system and barriers to care. PHQ-2 and PHQ-9 Depression Screening Tool performed and results reviewed with patient. Other interventions included:       Solution-Focused Therapy Performed, Mindfulness Meditation Strategies, Relaxation Techniques and Deep Breathing Exercises Encouraged, Active Listening/       Reflection Utilized, Emotional Support Provided, Problem Solving [place], Psychoeducation /Health Education, Motivational        Interviewing, Brief Cognitive Behavioral Therapy Initiated, Reviewed Mental Health Medications and Discussed Compliance, Quality of Sleep Assessed and  Sleep Hygiene Techniques Promoted, Support Group Participation Encouraged, Increase Level of Activity/Exercise, Verbalization of Feelings Encouraged,        Crisis Resource Education/Information Provided, Suicidal Ideation/Homicidal Ideation Assessed - None Present.    Provided mental health counseling with regards to Anxiety and Caregiver Stress.      Discussed plans with patient for ongoing care management follow-up and provided patient with direct contact information for care management team. Discussed several options for long-term counseling based on need and insurance, and verbal consent obtained to place a referral to [facility] for ongoing mental health counseling and supportive services.  Provided list of Caregiver Support Groups. Collaboration with Primary Care Physician, Dr. [name] regarding development and update of comprehensive plan of care as evidenced by provider attestation and co-signature. Inter-disciplinary care team collaboration (see longitudinal plan of care). Patient Goals/Self-Care Activities: Begin personal counseling with LCSW on a weekly/bi-weekly basis, to reduce and manage symptoms of Anxiety and Caregiver Stress, until established with [place]. Accept all calls from representative with [place] in an effort to establish ongoing mental health counseling and supportive services. Incorporate into daily practice - relaxation techniques, deep breathing exercises and mindfulness meditation strategies. Talk about feelings with a friend, family member, or spiritual advisor. Continue with compliance of taking prescription medications, try to obtain adequate rest, stay well-hydrated and eat a healthy, well-balanced diet. Review list of Caregiver Support Groups and Self-Enroll in a Caregiver Support Group of interest, notifying LCSW directly (# [phone]) if you have questions or need assistance with the referral process. Follow-Up:  06/16/2021 at 10:00am

## 2021-06-09 NOTE — Chronic Care Management (AMB) (Signed)
Chronic Care Management    Clinical Social Work Note  06/09/2021 Name: Tiffany Gregory MRN: [id number] DOB: 1951-05-25  Tiffany Gregory is a 70 y.o. year old female who is a primary care patient of Tiffany Gregory, Tiffany Aline, FNP. The CCM team was consulted to assist the patient with chronic disease management and/or care coordination needs related to: Mental Health Counseling and Resources.   Engaged with patient by telephone for initial visit in response to provider referral for social work chronic care management and care coordination services.   Consent to Services:  The patient was given information about Chronic Care Management services, agreed to services, and gave verbal consent prior to initiation of services.  Please see initial visit note for detailed documentation.   Patient agreed to services and consent obtained.   Assessment: Review of patient past medical history, allergies, medications, and health status, including review of relevant consultants reports was performed today as part of a comprehensive evaluation and provision of chronic care management and care coordination services.     SDOH (Social Determinants of Health) assessments and interventions performed:  SDOH Interventions    Flowsheet Row Most Recent Value  SDOH Interventions   Food Insecurity Interventions Intervention Not Indicated  Financial Strain Interventions Intervention Not Indicated  Housing Interventions Intervention Not Indicated  Intimate Partner Violence Interventions Intervention Not Indicated  Physical Activity Interventions Intervention Not Indicated, Patient Refused  Stress Interventions Offered [employer] Resources, Provide Counseling, Other (Comment)  [Provide Commuity Support Group and Caregiver Resources]  Social Connections Interventions Intervention Not Indicated  Transportation Interventions Intervention Not Indicated  Depression Interventions/Treatment  Referral to Psychiatry,  Medication, Counseling        Advanced Directives Status: See Care Plan for related entries.  CCM Care Plan  Allergies  Allergen Reactions   Lemon Flavor Anaphylaxis    "my throat closes up"   Tiffany Gregory Other (See Comments)   Sulfa Antibiotics Other (See Comments)    Outpatient Encounter Medications as of 06/08/2021  Medication Sig Note   acetaminophen (TYLENOL) 325 MG tablet Take 650 mg by mouth every 6 (six) hours as needed.    Apoaequorin (PREVAGEN PO) Take 1 tablet by mouth daily.    aspirin EC 81 MG tablet Take 81 mg by mouth daily. Swallow whole.    atorvastatin (LIPITOR) 20 MG tablet Take 1 tablet (20 mg total) by mouth daily.    Blood Glucose Monitoring Suppl (ONE TOUCH ULTRA 2) w/Device KIT by Does not apply route.    cyanocobalamin 1000 MCG tablet Take 1 tablet by mouth daily.    cyclobenzaprine (FLEXERIL) 5 MG tablet Take 1 tablet (5 mg total) by mouth at bedtime as needed for muscle spasms (will cause drowsiness.). (Patient not taking: Reported on 05/26/2021)    glucose blood test strip 1 each by Other route as needed for other. Use as instructed. One touch ultra blue in vitro strips    meloxicam (MOBIC) 7.5 MG tablet Take 7.5 mg by mouth in the morning and at bedtime. 04/07/2021: Taking once daily   metFORMIN (GLUCOPHAGE XR) 500 MG 24 hr tablet Take 1 tablet (500 mg total) by mouth daily with breakfast.    omeprazole (PRILOSEC) 20 MG capsule Take 2 capsules (40 mg total) by mouth daily. 04/07/2021: 20 mg daily   OneTouch Delica Lancets 83A MISC by Does not apply route.    polyethylene glycol (MIRALAX / GLYCOLAX) 17 g packet Take 17 g by mouth daily.    ramipril (ALTACE) 10 MG capsule Take  2 capsules (20 mg total) by mouth daily.    Semaglutide, 1 MG/DOSE, (OZEMPIC, 1 MG/DOSE,) 4 MG/3ML SOPN Inject 1 mg into the skin once a week.    vitamin E 180 MG (400 UNITS) capsule Take 400 Units by mouth daily.    No facility-administered encounter medications on file as of  06/08/2021.    Patient Active Problem List   Diagnosis Date Noted   Nasal congestion 02/14/2021   Muscle spasm 01/24/2021   B12 deficiency 01/24/2021   Neck pain 01/24/2021   Fall 01/24/2021   Gastroesophageal reflux disease 11/29/2020   Memory loss 10/27/2020   History of craniotomy 10/27/2020   Family history of GERD 10/27/2020   Hyperlipidemia 06/22/2020   Elevated liver enzymes 06/22/2020   History of hepatitis B virus infection 06/22/2020   Overweight with body mass index (BMI) of 27 to 27.9 in adult 06/22/2020   Hepatic steatosis 06/15/2020   Encounter for medical examination to establish care 06/14/2020   Type 2 diabetes mellitus without complication, without long-term current use of insulin ([place]) 06/14/2020   Essential hypertension 06/14/2020   Skin lesion 06/14/2020    Conditions to be addressed/monitored: Anxiety and Caregiver Stress.  Limited Social Support, Mental Health Concerns, Social Isolation, Memory Deficits, and Lacks Knowledge of [employer].  Care Plan : LCSW Plan of Care  Updates made by Tiffany Gaines, LCSW since 06/09/2021 12:00 AM     Problem: Find Caregiver Support and Counseling Services in My Community.   Priority: High     Goal: Find Multimedia programmer and Counseling Services in My Community.   Start Date: 06/08/2021  Expected End Date: 08/08/2021  This Visit's Progress: On track  Priority: High  Note:   Current Barriers:   Acute Mental Health needs related to Anxiety and Caregiver Stress, requires Support, Education, Resources, Referrals and Care Coordination in order to meet unmet mental health needs. Clinical Goal(s):  Patient will work with LCSW to reduce and manage symptoms of Anxiety and Caregiver Stress, until established with a community provider and enrolled in a Caregiver Support Group.   Patient will increase knowledge and/or ability of:        Coping Skills, Healthy Habits, Self-Management Skills, Stress Reduction, Home Safety  and Utilizing [employer] and Resources.   Clinical Interventions:  Assessed patient's previous treatment, needs, coping skills, current treatment, support system and barriers to care. PHQ-2 and PHQ-9 Depression Screening Tool performed and results reviewed with patient. Other interventions included:       Solution-Focused Therapy Performed, Mindfulness Meditation Strategies, Relaxation Techniques and Deep Breathing Exercises Encouraged, Active Listening/       Reflection Utilized, Emotional Support Provided, Problem Solving [place], Psychoeducation /Health Education, Motivational        Interviewing, Brief Cognitive Behavioral Therapy Initiated, Reviewed Mental Health Medications and Discussed Compliance, Quality of Sleep Assessed and       Sleep Hygiene Techniques Promoted, Support Group Participation Encouraged, Increase Level of Activity/Exercise, Verbalization of Feelings Encouraged,        Crisis Resource Education/Information Provided, Suicidal Ideation/Homicidal Ideation Assessed - None Present.   Provided mental health counseling with regards to Anxiety and Caregiver Stress.      Discussed plans with patient for ongoing care management follow-up and provided patient with direct contact information for care management team. Discussed several options for long-term counseling based on need and insurance, and verbal consent obtained to place a referral to [facility] for ongoing mental health counseling and supportive services.  Provided list of Caregiver Support Groups. Collaboration with Primary Care Physician, Dr. [name] regarding development and update of comprehensive plan of care as evidenced by provider attestation and co-signature. Inter-disciplinary care team collaboration (see longitudinal plan of care). Patient Goals/Self-Care Activities: Begin personal counseling with LCSW on a weekly/bi-weekly basis, to reduce and manage symptoms of  Anxiety and Caregiver Stress, until established with [place]. Accept all calls from representative with [place] in an effort to establish ongoing mental health counseling and supportive services. Incorporate into daily practice - relaxation techniques, deep breathing exercises and mindfulness meditation strategies. Talk about feelings with a friend, family member, or spiritual advisor. Continue with compliance of taking prescription medications, try to obtain adequate rest, stay well-hydrated and eat a healthy, well-balanced diet. Review list of Caregiver Support Groups and Self-Enroll in a Caregiver Support Group of interest, notifying LCSW directly (# [phone]) if you have questions or need assistance with the referral process. Follow-Up:  06/16/2021 at 10:00am        Follow-Up Plan:  06/16/2021 at 10:00am      [place] Worker [place]  [phone]

## 2021-06-16 ENCOUNTER — Other Ambulatory Visit: Payer: Self-pay

## 2021-06-16 ENCOUNTER — Ambulatory Visit: Payer: Medicare Other | Admitting: *Deleted

## 2021-06-16 DIAGNOSIS — R413 Other amnesia: Secondary | ICD-10-CM

## 2021-06-16 DIAGNOSIS — W19XXXD Unspecified fall, subsequent encounter: Secondary | ICD-10-CM

## 2021-06-16 DIAGNOSIS — Z9889 Other specified postprocedural states: Secondary | ICD-10-CM

## 2021-06-16 DIAGNOSIS — K760 Fatty (change of) liver, not elsewhere classified: Secondary | ICD-10-CM

## 2021-06-16 DIAGNOSIS — I6529 Occlusion and stenosis of unspecified carotid artery: Secondary | ICD-10-CM

## 2021-06-16 DIAGNOSIS — E785 Hyperlipidemia, unspecified: Secondary | ICD-10-CM

## 2021-06-16 DIAGNOSIS — E119 Type 2 diabetes mellitus without complications: Secondary | ICD-10-CM

## 2021-06-16 DIAGNOSIS — I10 Essential (primary) hypertension: Secondary | ICD-10-CM

## 2021-06-16 NOTE — Patient Instructions (Signed)
Visit Information  PATIENT GOALS:  Goals Addressed               This Visit's Progress     Find Caregiver Support and Counseling Services in My Community. (pt-stated)   On track     Timeframe:  Short-Term Goal Priority:  High Start Date:  06/08/2021                         Expected End Date: 08/08/2021                    Follow-Up Date:  07/21/2021 at 10:00am  Patient Goals/Self-Care Activities: Receive personal counseling through [facility], to reduce and manage symptoms of Anxiety and Caregiver Stress.   Continue to incorporate into daily practice - relaxation techniques, deep breathing exercises and mindfulness meditation strategies. Consider self-enrollment in a Caregiver Support Group, from the list provided.   Review list of Senior Resource Activities in [facility] and consider participating in activities of interest. Achieve your goal of starting a "Women's [place] [employer] Program" in [place].        Patient verbalizes understanding of instructions provided today and agrees to view in [place].   Telephone follow-up appointment with care management team member scheduled for:  06/21/2021 at 10:00am  [place] Licensed Clinical Social Worker [place]  [phone]

## 2021-06-16 NOTE — Chronic Care Management (AMB) (Signed)
Chronic Care Management    Clinical Social Work Note  06/16/2021 Name: Tiffany Gregory MRN: [id number] DOB: 10/14/1950  Tiffany Gregory is a 70 y.o. year old female who is a primary care patient of [name], [name], FNP. The CCM team was consulted to assist the patient with chronic disease management and/or care coordination needs related to: Mental Health Counseling and Resources and Caregiver Stress.   Engaged with patient by telephone for follow-up visit in response to provider referral for social work chronic care management and care coordination services.   Consent to Services:  The patient was given information about Chronic Care Management services, agreed to services, and gave verbal consent prior to initiation of services.  Please see initial visit note for detailed documentation.   Patient agreed to services and consent obtained.   Assessment: Review of patient past medical history, allergies, medications, and health status, including review of relevant consultants reports was performed today as part of a comprehensive evaluation and provision of chronic care management and care coordination services.     SDOH (Social Determinants of Health) assessments and interventions performed:    Advanced Directives Status: Not addressed in this encounter.  CCM Care Plan  Allergies  Allergen Reactions   Lemon Flavor Anaphylaxis    "my throat closes up"   [name] Other (See Comments)   Sulfa Antibiotics Other (See Comments)    Outpatient Encounter Medications as of 06/16/2021  Medication Sig Note   acetaminophen (TYLENOL) 325 MG tablet Take 650 mg by mouth every 6 (six) hours as needed.    Apoaequorin (PREVAGEN PO) Take 1 tablet by mouth daily.    aspirin EC 81 MG tablet Take 81 mg by mouth daily. Swallow whole.    atorvastatin (LIPITOR) 20 MG tablet Take 1 tablet (20 mg total) by mouth daily.    Blood Glucose Monitoring Suppl (ONE TOUCH ULTRA 2) w/Device KIT by Does not  apply route.    cyanocobalamin 1000 MCG tablet Take 1 tablet by mouth daily.    cyclobenzaprine (FLEXERIL) 5 MG tablet Take 1 tablet (5 mg total) by mouth at bedtime as needed for muscle spasms (will cause drowsiness.). (Patient not taking: Reported on 05/26/2021)    glucose blood test strip 1 each by Other route as needed for other. Use as instructed. One touch ultra blue in vitro strips    meloxicam (MOBIC) 7.5 MG tablet Take 7.5 mg by mouth in the morning and at bedtime. 04/07/2021: Taking once daily   metFORMIN (GLUCOPHAGE XR) 500 MG 24 hr tablet Take 1 tablet (500 mg total) by mouth daily with breakfast.    omeprazole (PRILOSEC) 20 MG capsule Take 2 capsules (40 mg total) by mouth daily. 04/07/2021: 20 mg daily   OneTouch Delica Lancets 56O MISC by Does not apply route.    polyethylene glycol (MIRALAX / GLYCOLAX) 17 g packet Take 17 g by mouth daily.    ramipril (ALTACE) 10 MG capsule Take 2 capsules (20 mg total) by mouth daily.    Semaglutide, 1 MG/DOSE, (OZEMPIC, 1 MG/DOSE,) 4 MG/3ML SOPN Inject 1 mg into the skin once a week.    vitamin E 180 MG (400 UNITS) capsule Take 400 Units by mouth daily.    No facility-administered encounter medications on file as of 06/16/2021.    Patient Active Problem List   Diagnosis Date Noted   Nasal congestion 02/14/2021   Muscle spasm 01/24/2021   B12 deficiency 01/24/2021   Neck pain 01/24/2021   Fall 01/24/2021  Gastroesophageal reflux disease 11/29/2020   Memory loss 10/27/2020   History of craniotomy 10/27/2020   Family history of GERD 10/27/2020   Hyperlipidemia 06/22/2020   Elevated liver enzymes 06/22/2020   History of hepatitis B virus infection 06/22/2020   Overweight with body mass index (BMI) of 27 to 27.9 in adult 06/22/2020   Hepatic steatosis 06/15/2020   Encounter for medical examination to establish care 06/14/2020   Type 2 diabetes mellitus without complication, without long-term current use of insulin ([place]) 06/14/2020   Essential  hypertension 06/14/2020   Skin lesion 06/14/2020    Conditions to be addressed/monitored: Anxiety and Caregiver Stress.  Limited Social Support, Mental Health Concerns, Limited Access to Caregiver.  Care Plan : LCSW Plan of Care  Updates made by Francis Gaines, LCSW since 06/16/2021 12:00 AM     Problem: Find Caregiver Support and Counseling Services in My Community.   Priority: High     Goal: Find Multimedia programmer and Counseling Services in My Community.   Start Date: 06/08/2021  Expected End Date: 08/08/2021  This Visit's Progress: On track  Recent Progress: On track  Priority: High  Note:   Current Barriers:   Acute Mental Health needs related to Anxiety and Caregiver Stress, requires Support, Education, Resources, Referrals and Care Coordination in order to meet unmet mental health needs. Clinical Goal(s):  Patient will work with LCSW to reduce and manage symptoms of Anxiety and Caregiver Stress, until established with a community provider and enrolled in a Caregiver Support Group.   Patient will increase knowledge and/or ability of:        Coping Skills, Healthy Habits, Self-Management Skills, Stress Reduction, Home Safety and Utilizing [employer] and Resources.   Clinical Interventions:  Assessed patient's previous treatment, needs, coping skills, current treatment, support system and barriers to care. PHQ-2 and PHQ-9 Depression Screening Tool performed and results reviewed with patient. Other interventions included:       Solution-Focused Therapy Performed, Mindfulness Meditation Strategies, Relaxation Techniques and Deep Breathing Exercises Encouraged, Active Listening/       Reflection Utilized, Emotional Support Provided, Problem Solving [place], Psychoeducation /Health Education, Motivational        Interviewing, Brief Cognitive Behavioral Therapy Initiated, Reviewed Mental Health Medications and Discussed Compliance, Quality of Sleep  Assessed and       Sleep Hygiene Techniques Promoted, Support Group Participation Encouraged, Increase Level of Activity/Exercise, Verbalization of Feelings Encouraged,        Crisis Resource Education/Information Provided, Suicidal Ideation/Homicidal Ideation Assessed - None Present.   Provided mental health counseling with regards to Anxiety and Caregiver Stress.      Discussed plans with patient for ongoing care management follow-up and provided patient with direct contact information for care management team. Collaboration with Primary Care Physician, Dr. [name] regarding development and update of comprehensive plan of care as evidenced by provider attestation and co-signature. Inter-disciplinary care team collaboration (see longitudinal plan of care). Patient Goals/Self-Care Activities: Receive personal counseling through [facility], to reduce and manage symptoms of Anxiety and Caregiver Stress.   Continue to incorporate into daily practice - relaxation techniques, deep breathing exercises and mindfulness meditation strategies. Consider self-enrollment in a Caregiver Support Group, from the list provided.   Review list of Senior Resource Activities in [facility] and consider participating in activities of interest. Achieve your goal of starting a "Women's [place] [employer] Program" in [place]. Follow-Up:  07/21/2021 at 10:00am  Follow-Up Plan:  07/21/2021 at 10:00am      [place] Licensed Clinical Social Worker [place]  [phone]

## 2021-06-21 ENCOUNTER — Telehealth: Payer: Medicare Other

## 2021-06-21 ENCOUNTER — Telehealth: Payer: Self-pay | Admitting: Pharmacist

## 2021-06-21 ENCOUNTER — Ambulatory Visit: Payer: Medicare Other | Admitting: Pharmacist

## 2021-06-21 DIAGNOSIS — E785 Hyperlipidemia, unspecified: Secondary | ICD-10-CM

## 2021-06-21 DIAGNOSIS — I10 Essential (primary) hypertension: Secondary | ICD-10-CM

## 2021-06-21 DIAGNOSIS — E119 Type 2 diabetes mellitus without complications: Secondary | ICD-10-CM

## 2021-06-21 NOTE — Telephone Encounter (Signed)
Medication Samples have been labeled and logged for the patient.  Drug name: Ozempic       Strength: 8 mg/3 mL        Qty: 1 pen  LOT: [id number]  Exp.Date: 12/06/23  Dosing instructions: Inject 1 mg (37 clicks) once weekly   The patient has been instructed regarding the correct time, dose, and frequency of taking this medication, including desired effects and most common side effects.   [name] 4:44 PM 06/21/2021

## 2021-06-21 NOTE — Chronic Care Management (AMB) (Signed)
Chronic Care Management Pharmacy Note  06/21/2021 Name:  Tiffany Gregory MRN:  [id number] DOB:  [birth year]  Subjective: Tiffany Gregory is an 70 y.o. year old female who is a primary patient of [name], [name], FNP.  Collaborating with supervising provider while PCP is on leave. The CCM team was consulted for assistance with disease management and care coordination needs.    Care coordination  for  medication access  in response to provider referral for pharmacy case management and/or care coordination services.   Consent to Services:  The patient was given information about Chronic Care Management services, agreed to services, and gave verbal consent prior to initiation of services.  Please see initial visit note for detailed documentation.   Patient Care Team: [name], FNP as PCP - General (Family Medicine) [name], MD as Consulting Physician (Ophthalmology) [name], MD as Consulting Physician (Neurology) [name], MD as Consulting Physician (Neurosurgery) [name], MD (Dermatology) [name], MD as Consulting Physician (Gastroenterology) [name], RPH-CPP (Pharmacist) [name], [name], LCSW as Social Worker (Licensed Clinical Social Worker)   Objective:  Lab Results  Component Value Date   CREATININE 0.73 01/24/2021   CREATININE 0.76 10/27/2020   CREATININE 0.79 08/12/2020    Lab Results  Component Value Date   HGBA1C 7.6 (H) 10/27/2020   Last diabetic Eye exam:  Lab Results  Component Value Date/Time   HMDIABEYEEXA No Retinopathy 08/24/2020 12:00 AM    Last diabetic Foot exam: No results found for: HMDIABFOOTEX      Component Value Date/Time   CHOL 139 10/27/2020 1128   TRIG 192 (H) 10/27/2020 1128   HDL 32 (L) 10/27/2020 1128   CHOLHDL 5 06/14/2020 1117   VLDL 57.2 (H) 06/14/2020 1117   [place] 74 10/27/2020 1128   LDLDIRECT 109.0 06/14/2020 1117    Hepatic Function Latest Ref  Rng & Units 01/24/2021 10/27/2020 10/11/2020  Total Protein 6.0 - 8.5 g/dL 7.5 8.0 7.7  Albumin 3.8 - 4.8 g/dL 4.5 4.8 4.5  AST 0 - 40 IU/L 26 33 28  ALT 0 - 32 IU/L 25 32 33(H)  Alk Phosphatase 44 - 121 IU/L 76 84 80  Total Bilirubin 0.0 - 1.2 mg/dL 0.3 0.3 0.4  Bilirubin, Direct 0.00 - 0.40 mg/dL - - 0.13    Lab Results  Component Value Date/Time   TSH 1.920 10/27/2020 11:28 AM   TSH 1.88 06/14/2020 11:17 AM    CBC Latest Ref Rng & Units 01/24/2021 10/27/2020 06/14/2020  WBC 3.4 - 10.8 x10E3/uL 10.4 8.3 6.8  Hemoglobin 11.1 - 15.9 g/dL 12.4 13.0 12.8  Hematocrit 34.0 - 46.6 % 37.1 40.4 37.6  Platelets 150 - 450 x10E3/uL 320 324 277.0    Lab Results  Component Value Date/Time   VD25OH 21.95 (L) 06/14/2020 11:17 AM    Clinical ASCVD: No  The 10-year ASCVD risk score (Arnett DK, et al., 2019) is: 18.9%   Values used to calculate the score:     Age: 17 years     Sex: Female     Is Non-Hispanic African American: No     Diabetic: Yes     Tobacco smoker: No     Systolic Blood Pressure: 614 mmHg     Is BP treated: Yes     HDL Cholesterol: 32 mg/dL     Total Cholesterol: 139 mg/dL      Social History   Tobacco Use  Smoking Status Never   Passive  exposure: Past  Smokeless Tobacco Never   BP Readings from Last 3 Encounters:  01/24/21 136/70  01/15/21 (!) 141/89  01/10/21 (!) 143/78   Pulse Readings from Last 3 Encounters:  02/14/21 91  01/24/21 71  01/15/21 87   Wt Readings from Last 3 Encounters:  02/14/21 159 lb (72.1 kg)  01/24/21 161 lb 3.2 oz (73.1 kg)  01/15/21 157 lb (71.2 kg)    Assessment: Review of patient past medical history, allergies, medications, health status, including review of consultants reports, laboratory and other test data, was performed as part of comprehensive evaluation and provision of chronic care management services.   SDOH:  (Social Determinants of Health) assessments and interventions performed:    CCM Care Plan  Allergies   Allergen Reactions   Lemon Flavor Anaphylaxis    "my throat closes up"   [name] Other (See Comments)   Sulfa Antibiotics Other (See Comments)    Medications Reviewed Today     Reviewed by Francis Gaines, LCSW (Social Worker) on 06/16/21 at 1001  Med List Status: <None>   Medication Order Taking? Sig Documenting Provider Last Dose Status Informant  acetaminophen (TYLENOL) 325 MG tablet [id number] No Take 650 mg by mouth every 6 (six) hours as needed. [provider] Taking Active   Apoaequorin (PREVAGEN PO) [id number] No Take 1 tablet by mouth daily. [provider] Taking Active   aspirin EC 81 MG tablet [id number] No Take 81 mg by mouth daily. Swallow whole. [provider] Taking Active   atorvastatin (LIPITOR) 20 MG tablet [id number] No Take 1 tablet (20 mg total) by mouth daily. [name], [name], FNP Taking Active   Blood Glucose Monitoring Suppl (ONE TOUCH ULTRA 2) w/Device KIT [id number] No by Does not apply route. [provider] Taking Active   cyanocobalamin 1000 MCG tablet [id number] No Take 1 tablet by mouth daily. [provider] Taking Active   cyclobenzaprine (FLEXERIL) 5 MG tablet [id number] No Take 1 tablet (5 mg total) by mouth at bedtime as needed for muscle spasms (will cause drowsiness.).  Patient not taking: Reported on 05/26/2021   [name], FNP Not Taking Active   glucose blood test strip [id number] No 1 each by Other route as needed for other. Use as instructed. One touch ultra blue in vitro strips Marval Regal, NP Taking Active   meloxicam (MOBIC) 7.5 MG tablet [id number] No Take 7.5 mg by mouth in the morning and at bedtime. [provider] Taking Active            Med Note Darnelle Maffucci, Arville Lime   Fri Apr 07, 2021  9:53 AM) Taking once daily  metFORMIN (GLUCOPHAGE XR) 500 MG 24 hr tablet [id number] No Take 1 tablet (500 mg total) by mouth daily with breakfast. [name], MD Taking  Active   omeprazole (PRILOSEC) 20 MG capsule [id number] No Take 2 capsules (40 mg total) by mouth daily. [name], [name], FNP Taking Active            Med Note Darnelle Maffucci, Arville Lime   Fri Apr 07, 2021  9:53 AM) 20 mg daily  OneTouch Delica Lancets 80D MISC [id number] No by Does not apply route. [provider] Taking Active   polyethylene glycol (MIRALAX / GLYCOLAX) 17 g packet [id number] No Take 17 g by mouth daily. [provider] Taking Active   ramipril (ALTACE) 10 MG capsule [id number] No Take 2 capsules (20 mg total) by mouth daily. [name],  MD Taking Active   Semaglutide, 1 MG/DOSE, (OZEMPIC, 1 MG/DOSE,) 4 MG/3ML SOPN [id number] No Inject 1 mg into the skin once a week. [name], MD Taking Active   vitamin E 180 MG (400 UNITS) capsule [id number] No Take 400 Units by mouth daily. [provider] Taking Active             Patient Active Problem List   Diagnosis Date Noted   Nasal congestion 02/14/2021   Muscle spasm 01/24/2021   B12 deficiency 01/24/2021   Neck pain 01/24/2021   Fall 01/24/2021   Gastroesophageal reflux disease 11/29/2020   Memory loss 10/27/2020   History of craniotomy 10/27/2020   Family history of GERD 10/27/2020   Hyperlipidemia 06/22/2020   Elevated liver enzymes 06/22/2020   History of hepatitis B virus infection 06/22/2020   Overweight with body mass index (BMI) of 27 to 27.9 in adult 06/22/2020   Hepatic steatosis 06/15/2020   Encounter for medical examination to establish care 06/14/2020   Type 2 diabetes mellitus without complication, without long-term current use of insulin ([employer]) 06/14/2020   Essential hypertension 06/14/2020   Skin lesion 06/14/2020    Immunization History  Administered Date(s) Administered   Fluad Quad(high Dose 65+) 07/22/2020   Hep A / Hep B 01/10/2021, 02/10/2021   Influenza-Unspecified 07/17/2018   Moderna Sars-Covid-2 Vaccination 01/13/2020, 02/10/2020, 08/27/2020   Pneumococcal  Conjugate-13 05/15/2017   Pneumococcal-Unspecified 10/08/2009    Conditions to be addressed/monitored: HTN, HLD, and DMII  Care Plan : Medication Management  Updates made by [name], RPH-CPP since 06/21/2021 12:00 AM     Problem: Diabetes, Hypertension      Long-Range Goal: Disease Progression Prevention   Start Date: 07/13/2020  This Visit's Progress: On track  Recent Progress: On track  Priority: High  Note:   Current Barriers:  Concerns regarding her ability to independently afford treatment regimen Unable to achieve control of diabetes  Complex medical history including hx hepatitis B, history of intracranial hemorrhage   Pharmacist Clinical Goal(s):  Over the next 90 days, patient will verbalize ability to afford treatment regimen through collaboration with PharmD and provider. Over the next 90 days, patient will achieve control of diabetes as evidenced by improvement in A1c through collaboration with PharmD and provider   Interventions: 1:1 collaboration with [name], [name], FNP regarding development and update of comprehensive plan of care as evidenced by provider attestation and co-signature Inter-disciplinary care team collaboration (see longitudinal plan of care) Comprehensive medication review performed; medication list updated in electronic medical record  Health Maintenance: Due for Shingrix, pneumonia. Will discuss moving forward.   SDOH: Reports her husband's health continues to decline (CHF, COPD, possible cancer). She is primary caregiver and wishes she had a support group. LCSW call upcoming   Diabetes: Uncontrolled but improved; current treatment: metformin XR 500 mg QAM, Ozempic 1 mg weekly Approved for Ozempic assistance through [employer] through 2022 Hx glimepiride (d/c d/t hypoglycemia)  Patient's order for Ozempic has not been received yet. Will provide a sample using Ozempic 2 mg pen.   Hypertension: Controlled per last  office visit; current treatment: ramipril 20 mg daily Previously on HCTZ, d/c d/t hyponatremia Previously recommended to continue current regimen  Hyperlipidemia and ASCVD risk reduction: Controlled around goal of 70; current treatment: atorvastatin 20 mg daily  Antiplatelet therapy: aspirin 81 mg daily  Previously recommended to continue current regimen at this time.    Hx esophageal stricture (per patient report): Controlled per patient report;  current regimen: omeprazole 20 mg daily;  Reports hx of esophageal stricture/narrowing requiring stretching, was advised to stay on PPI therapy for life Previously recommended to continue current regimen  Arthritis: Moderately well controlled per patient report; current regimen: meloxicam 7.5 mg QAM (prescribed BID but only remembering to take QAM); acetaminophen 325 mg PRN, occasional ibuprofen 200 mg for headache Previously reviewed to avoid combining ibuprofen and meloxicam due to long term renal, CV, GI effects of NSAID therapy. Previously recommended to continue current regimen   Supplements: Vitamin B12, Vitamin E - reports she started this to help with skin. Reviewed risks of high dose Vitamin E  Patient Goals/Self-Care Activities Over the next 90 days, patient will:  - take medications as prescribed check blood glucose twice daily, document, and provide at future appointments  Follow Up Plan: Telephone follow up appointment with care management team member scheduled for: ~ 8 weeks as previously scheduled     Medication Assistance:  Ozempic obtained through [employer] medication assistance program.  Enrollment ends 09/06/21  Patient's preferred pharmacy is:  [facility] [place], [place] [facility] [address] [place] [address] Phone: [phone] Fax: [phone]    Follow Up:  Patient agrees to Care Plan and Follow-up.  Plan:  Telephone follow up appointment with care management team member scheduled for:  ~ 8 weeks  Catie Darnelle Maffucci, PharmD, [place], [place] Clinical Pharmacist [employer] [facility] [employer] [phone]

## 2021-06-21 NOTE — Patient Instructions (Signed)
Visit Information  PATIENT GOALS:  Goals Addressed               This Visit's Progress     Patient Stated     Self Monitoring (pt-stated)        Patient Goals/Self-Care Activities Over the next 90 days, patient will:  - take medications as prescribed check blood glucose twice daily, document, and provide at future appointments         Patient verbalizes understanding of instructions provided today and agrees to view in [place].   Plan: Telephone follow up appointment with care management team member scheduled for:  ~ 8 weeks  Catie Darnelle Maffucci, PharmD, [place], [place] Clinical Pharmacist [employer] at [employer] [phone]

## 2021-06-22 NOTE — Telephone Encounter (Signed)
Patient picked up sample 

## 2021-06-26 ENCOUNTER — Other Ambulatory Visit: Payer: Self-pay

## 2021-06-26 DIAGNOSIS — K219 Gastro-esophageal reflux disease without esophagitis: Secondary | ICD-10-CM

## 2021-06-26 MED ORDER — OMEPRAZOLE 20 MG PO CPDR: 40.0000 mg | DELAYED_RELEASE_CAPSULE | Freq: Every day | ORAL | 0 refills | Status: DC

## 2021-06-27 ENCOUNTER — Telehealth: Payer: Self-pay

## 2021-06-27 NOTE — Telephone Encounter (Signed)
Pt medication delivered 4 boxes of ozempic 4mg /42ml and placed the fridge. Placed call to pt to let her know her medication is ready for pick up.

## 2021-06-28 NOTE — Telephone Encounter (Signed)
Patient picked up her medication

## 2021-07-07 DIAGNOSIS — I10 Essential (primary) hypertension: Secondary | ICD-10-CM

## 2021-07-07 DIAGNOSIS — E785 Hyperlipidemia, unspecified: Secondary | ICD-10-CM

## 2021-07-07 DIAGNOSIS — E119 Type 2 diabetes mellitus without complications: Secondary | ICD-10-CM | POA: Diagnosis not present

## 2021-07-12 ENCOUNTER — Ambulatory Visit: Payer: Medicare Other | Admitting: Gastroenterology

## 2021-07-14 ENCOUNTER — Other Ambulatory Visit: Payer: Self-pay

## 2021-07-14 DIAGNOSIS — E119 Type 2 diabetes mellitus without complications: Secondary | ICD-10-CM

## 2021-07-14 MED ORDER — GLUCOSE BLOOD VI STRP
1.0000 | ORAL_STRIP | 3 refills | Status: AC | PRN
Start: 2021-07-14 — End: ?

## 2021-07-21 ENCOUNTER — Ambulatory Visit (INDEPENDENT_AMBULATORY_CARE_PROVIDER_SITE_OTHER): Payer: Medicare Other | Admitting: *Deleted

## 2021-07-21 DIAGNOSIS — E119 Type 2 diabetes mellitus without complications: Secondary | ICD-10-CM

## 2021-07-21 DIAGNOSIS — Z9889 Other specified postprocedural states: Secondary | ICD-10-CM

## 2021-07-21 DIAGNOSIS — W19XXXD Unspecified fall, subsequent encounter: Secondary | ICD-10-CM

## 2021-07-21 DIAGNOSIS — R413 Other amnesia: Secondary | ICD-10-CM

## 2021-07-21 DIAGNOSIS — I10 Essential (primary) hypertension: Secondary | ICD-10-CM

## 2021-07-24 NOTE — Chronic Care Management (AMB) (Signed)
Chronic Care Management    Clinical Social Work Note  07/24/2021 Name: Tiffany Gregory MRN: [id number] DOB: 04-17-1951  Tiffany Gregory is a 70 y.o. year old female who is a primary care patient of [name], Tiffany Aline, FNP. The CCM team was consulted to assist the patient with chronic disease management and/or care coordination needs related to: Mental Health Counseling and Resources and Caregiver Stress.   Engaged with patient by telephone for follow-up visit in response to provider referral for social work chronic care management and care coordination services.   Consent to Services:  The patient was given information about Chronic Care Management services, agreed to services, and gave verbal consent prior to initiation of services.  Please see initial visit note for detailed documentation.   Patient agreed to services and consent obtained.   Assessment: Review of patient past medical history, allergies, medications, and health status, including review of relevant consultants reports was performed today as part of a comprehensive evaluation and provision of chronic care management and care coordination services.     SDOH (Social Determinants of Health) assessments and interventions performed:    Advanced Directives Status: Not addressed in this encounter.  CCM Care Plan  Allergies  Allergen Reactions   Lemon Flavor Anaphylaxis    "my throat closes up"   [name] Other (See Comments)   Sulfa Antibiotics Other (See Comments)    Outpatient Encounter Medications as of 07/21/2021  Medication Sig Note   acetaminophen (TYLENOL) 325 MG tablet Take 650 mg by mouth every 6 (six) hours as needed.    Apoaequorin (PREVAGEN PO) Take 1 tablet by mouth daily.    aspirin EC 81 MG tablet Take 81 mg by mouth daily. Swallow whole.    atorvastatin (LIPITOR) 20 MG tablet Take 1 tablet (20 mg total) by mouth daily.    Blood Glucose Monitoring Suppl (ONE TOUCH ULTRA 2) w/Device KIT by Does  not apply route.    cyanocobalamin 1000 MCG tablet Take 1 tablet by mouth daily.    cyclobenzaprine (FLEXERIL) 5 MG tablet Take 1 tablet (5 mg total) by mouth at bedtime as needed for muscle spasms (will cause drowsiness.). (Patient not taking: Reported on 05/26/2021)    glucose blood test strip 1 each by Other route as needed for other. Use as instructed. One touch ultra blue in vitro strips    meloxicam (MOBIC) 7.5 MG tablet Take 7.5 mg by mouth in the morning and at bedtime. 04/07/2021: Taking once daily   metFORMIN (GLUCOPHAGE XR) 500 MG 24 hr tablet Take 1 tablet (500 mg total) by mouth daily with breakfast.    omeprazole (PRILOSEC) 20 MG capsule Take 2 capsules (40 mg total) by mouth daily.    OneTouch Delica Lancets 80H MISC by Does not apply route.    polyethylene glycol (MIRALAX / GLYCOLAX) 17 g packet Take 17 g by mouth daily.    ramipril (ALTACE) 10 MG capsule Take 2 capsules (20 mg total) by mouth daily.    Semaglutide, 1 MG/DOSE, (OZEMPIC, 1 MG/DOSE,) 4 MG/3ML SOPN Inject 1 mg into the skin once a week.    vitamin E 180 MG (400 UNITS) capsule Take 400 Units by mouth daily.    No facility-administered encounter medications on file as of 07/21/2021.    Patient Active Problem List   Diagnosis Date Noted   Nasal congestion 02/14/2021   Muscle spasm 01/24/2021   B12 deficiency 01/24/2021   Neck pain 01/24/2021   Fall 01/24/2021   Gastroesophageal reflux disease  11/29/2020   Memory loss 10/27/2020   History of craniotomy 10/27/2020   Family history of GERD 10/27/2020   Hyperlipidemia 06/22/2020   Elevated liver enzymes 06/22/2020   History of hepatitis B virus infection 06/22/2020   Overweight with body mass index (BMI) of 27 to 27.9 in adult 06/22/2020   Hepatic steatosis 06/15/2020   Encounter for medical examination to establish care 06/14/2020   Type 2 diabetes mellitus without complication, without long-term current use of insulin ([place]) 06/14/2020   Essential hypertension  06/14/2020   Skin lesion 06/14/2020    Conditions to be addressed/monitored: Anxiety and Caregiver Stress.  Limited Social Support, Mental Health Concerns, Social Isolation, Limited Access to Caregiver, and Lacks Knowledge of [employer].  Care Plan : LCSW Plan of Care  Updates made by Francis Gaines, LCSW since 07/24/2021 12:00 AM     Problem: Find Caregiver Support and Counseling Services in My Community.   Priority: High     Goal: Find Multimedia programmer and Counseling Services in My Community.   Start Date: 06/08/2021  Expected End Date: 08/04/2021  This Visit's Progress: On track  Recent Progress: On track  Priority: High  Note:   Current Barriers:   Acute Mental Health needs related to Anxiety and Caregiver Stress, requires Support, Education, Resources, Referrals and Care Coordination in order to meet unmet mental health needs. Clinical Goal(s):  Patient will work with LCSW to reduce and manage symptoms of Anxiety and Caregiver Stress, until established with a community provider and enrolled in a Caregiver Support Group.   Clinical Interventions:  Active Listening/Reflection Utilized, Emotional Support Provided, Support Group Participation Encouraged, Verbalization of Feelings Encouraged and Caregiver Stress Acknowledged.   Collaboration with Primary Care Physician, Dr. [name] regarding development and update of comprehensive plan of care as evidenced by provider attestation and co-signature. Patient Goals/Self-Care Activities: Receive personal counseling through LCSW on a weekly/bi-weekly basis, to reduce and manage symptoms of Anxiety and Caregiver Stress, until established with a community provider.   Continue to consider self-enrollment in a Caregiver Support Group, from the list provided. Try to incorporate into daily practice, at least one activity or hobby of interest. Contact LCSW directly (# [phone]) if you have questions, need assistance, or  if additional social work needs are identified between now and our next scheduled telephone outreach call.   Follow-Up:  08/04/2021 at 10:00am     [place] Licensed Clinical Social Worker [place]  [phone]

## 2021-07-24 NOTE — Patient Instructions (Signed)
Visit Information  PATIENT GOALS:  Goals Addressed               This Visit's Progress     Find Caregiver Support and Counseling Services in My Community. (pt-stated)   On track     Timeframe:  Short-Term Goal Priority:  High Start Date:  06/08/2021                         Expected End Date: 08/04/2021                    Follow-Up Date:  08/04/2021 at 10:00am  Patient Goals/Self-Care Activities: Receive personal counseling through LCSW on a weekly/bi-weekly basis, to reduce and manage symptoms of Anxiety and Caregiver Stress, until established with a community provider.   Continue to consider self-enrollment in a Caregiver Support Group, from the list provided. Try to incorporate into daily practice, at least one activity or hobby of interest.        Patient verbalizes understanding of instructions provided today and agrees to view in [place].   Telephone follow up appointment with care management team member scheduled for:  08/04/2021 at 10:00am  [place] Licensed Clinical Social Worker [place]  [phone]

## 2021-08-04 ENCOUNTER — Ambulatory Visit: Payer: Medicare Other | Admitting: *Deleted

## 2021-08-04 DIAGNOSIS — E119 Type 2 diabetes mellitus without complications: Secondary | ICD-10-CM

## 2021-08-04 DIAGNOSIS — W19XXXD Unspecified fall, subsequent encounter: Secondary | ICD-10-CM

## 2021-08-04 DIAGNOSIS — R413 Other amnesia: Secondary | ICD-10-CM

## 2021-08-04 DIAGNOSIS — I10 Essential (primary) hypertension: Secondary | ICD-10-CM

## 2021-08-04 DIAGNOSIS — Z9889 Other specified postprocedural states: Secondary | ICD-10-CM

## 2021-08-04 NOTE — Patient Instructions (Signed)
Visit Information  PATIENT GOALS:  Goals Addressed               This Visit's Progress     Find Caregiver Support and Counseling Services in My Community. (pt-stated)   On track     Timeframe:  Short-Term Goal Priority:  High Start Date:  06/08/2021                         Expected End Date: 08/18/2021                    Follow-Up Date:  08/18/2021 at 2:00pm  Patient Goals/Self-Care Activities: Receive personal counseling with LCSW, on a weekly/bi-weekly basis, to reduce and manage symptoms of Anxiety and Caregiver Stress, until manageable.     Congratulations on the 21 lb. intentional weight loss.  Continue to try and eat a healthy, well-balanced diet, drink plenty of fluids, get adequate rest and increase level of activity/exercise. Enjoy your family reunion next week, spending time with loved ones and reminiscing about wonderful childhood memories.   Contact LCSW directly (# [phone]) if you have questions, need assistance, or if additional social work needs are identified between now and our next scheduled telephone outreach call.         Patient verbalizes understanding of instructions provided today and agrees to view in [place].   Telephone follow up appointment with care management team member scheduled for:  08/18/2021 at 2:00pm  [place] Licensed Clinical Social Worker [place]  [phone]

## 2021-08-04 NOTE — Chronic Care Management (AMB) (Signed)
Chronic Care Management    Clinical Social Work Note  08/04/2021 Name: Tiffany Gregory MRN: [id number] DOB: [birth year]  Tiffany Gregory is a 70 y.o. year old female who is a primary care patient of [name], Tiffany Aline, FNP. The CCM team was consulted to assist the patient with chronic disease management and/or care coordination needs related to: Mental Health Counseling and Resources and Caregiver Stress.   Engaged with patient by telephone for follow-up visit in response to provider referral for social work chronic care management and care coordination services.   Consent to Services:  The patient was given information about Chronic Care Management services, agreed to services, and gave verbal consent prior to initiation of services.  Please see initial visit note for detailed documentation.   Patient agreed to services and consent obtained.   Assessment: Review of patient past medical history, allergies, medications, and health status, including review of relevant consultants reports was performed today as part of a comprehensive evaluation and provision of chronic care management and care coordination services.     SDOH (Social Determinants of Health) assessments and interventions performed:    Advanced Directives Status: Not addressed in this encounter.  CCM Care Plan  Allergies  Allergen Reactions   Lemon Flavor Anaphylaxis    "my throat closes up"   [name] Other (See Comments)   Sulfa Antibiotics Other (See Comments)    Outpatient Encounter Medications as of 08/04/2021  Medication Sig Note   acetaminophen (TYLENOL) 325 MG tablet Take 650 mg by mouth every 6 (six) hours as needed.    Apoaequorin (PREVAGEN PO) Take 1 tablet by mouth daily.    aspirin EC 81 MG tablet Take 81 mg by mouth daily. Swallow whole.    atorvastatin (LIPITOR) 20 MG tablet Take 1 tablet (20 mg total) by mouth daily.    Blood Glucose Monitoring Suppl (ONE TOUCH ULTRA 2) w/Device KIT by Does  not apply route.    cyanocobalamin 1000 MCG tablet Take 1 tablet by mouth daily.    cyclobenzaprine (FLEXERIL) 5 MG tablet Take 1 tablet (5 mg total) by mouth at bedtime as needed for muscle spasms (will cause drowsiness.). (Patient not taking: Reported on 05/26/2021)    glucose blood test strip 1 each by Other route as needed for other. Use as instructed. One touch ultra blue in vitro strips    meloxicam (MOBIC) 7.5 MG tablet Take 7.5 mg by mouth in the morning and at bedtime. 04/07/2021: Taking once daily   metFORMIN (GLUCOPHAGE XR) 500 MG 24 hr tablet Take 1 tablet (500 mg total) by mouth daily with breakfast.    omeprazole (PRILOSEC) 20 MG capsule Take 2 capsules (40 mg total) by mouth daily.    OneTouch Delica Lancets 57Q MISC by Does not apply route.    polyethylene glycol (MIRALAX / GLYCOLAX) 17 g packet Take 17 g by mouth daily.    ramipril (ALTACE) 10 MG capsule Take 2 capsules (20 mg total) by mouth daily.    Semaglutide, 1 MG/DOSE, (OZEMPIC, 1 MG/DOSE,) 4 MG/3ML SOPN Inject 1 mg into the skin once a week.    vitamin E 180 MG (400 UNITS) capsule Take 400 Units by mouth daily.    No facility-administered encounter medications on file as of 08/04/2021.    Patient Active Problem List   Diagnosis Date Noted   Nasal congestion 02/14/2021   Muscle spasm 01/24/2021   B12 deficiency 01/24/2021   Neck pain 01/24/2021   Fall 01/24/2021   Gastroesophageal reflux disease  11/29/2020   Memory loss 10/27/2020   History of craniotomy 10/27/2020   Family history of GERD 10/27/2020   Hyperlipidemia 06/22/2020   Elevated liver enzymes 06/22/2020   History of hepatitis B virus infection 06/22/2020   Overweight with body mass index (BMI) of 27 to 27.9 in adult 06/22/2020   Hepatic steatosis 06/15/2020   Encounter for medical examination to establish care 06/14/2020   Type 2 diabetes mellitus without complication, without long-term current use of insulin ([place]) 06/14/2020   Essential hypertension  06/14/2020   Skin lesion 06/14/2020    Conditions to be addressed/monitored: Anxiety and Caregiver Stress.  Limited Social Support, Mental Health Concerns, and Lacks Knowledge of [employer].  Care Plan : LCSW Plan of Care  Updates made by Francis Gaines, LCSW since 08/04/2021 12:00 AM     Problem: Find Caregiver Support and Counseling Services in My Community.   Priority: High     Goal: Find Multimedia programmer and Counseling Services in My Community.   Start Date: 06/08/2021  Expected End Date: 08/18/2021  This Visit's Progress: On track  Recent Progress: On track  Priority: High  Note:   Current Barriers:   Acute Mental Health needs related to Anxiety and Caregiver Stress, requires Support, Education, Resources, Referrals and Care Coordination in order to meet unmet mental health needs. Clinical Goal(s):  Patient will work with LCSW to reduce and manage symptoms of Anxiety and Caregiver Stress.     Clinical Interventions:  Active Listening utilized, Emotional Support provided, Verbalization of Feelings encouraged and Caregiver Stress acknowledged.   Collaboration with Primary Care Physician, Dr. [name] regarding development and update of comprehensive plan of care as evidenced by provider attestation and co-signature. Patient Goals/Self-Care Activities: Receive personal counseling with LCSW, on a weekly/bi-weekly basis, to reduce and manage symptoms of Anxiety and Caregiver Stress, until manageable.     Congratulations on the 21 lb. intentional weight loss.  Continue to try and eat a healthy, well-balanced diet, drink plenty of fluids, get adequate rest and increase level of activity/exercise. Enjoy your family reunion next week, spending time with loved ones and reminiscing about wonderful childhood memories.   Contact LCSW directly (# [id number]) if you have questions, need assistance, or if additional social work needs are identified between now and our next  scheduled telephone outreach call.   Follow-Up:  08/18/2021 at 2:00pm     [place] Clinical Social Worker [place]  [phone]

## 2021-08-07 DIAGNOSIS — E119 Type 2 diabetes mellitus without complications: Secondary | ICD-10-CM | POA: Diagnosis not present

## 2021-08-07 DIAGNOSIS — I10 Essential (primary) hypertension: Secondary | ICD-10-CM

## 2021-08-12 DIAGNOSIS — Z79899 Other long term (current) drug therapy: Secondary | ICD-10-CM | POA: Diagnosis not present

## 2021-08-12 DIAGNOSIS — G510 Bell's palsy: Secondary | ICD-10-CM | POA: Diagnosis not present

## 2021-08-12 DIAGNOSIS — G9389 Other specified disorders of brain: Secondary | ICD-10-CM | POA: Diagnosis not present

## 2021-08-12 DIAGNOSIS — R200 Anesthesia of skin: Secondary | ICD-10-CM | POA: Diagnosis not present

## 2021-08-15 ENCOUNTER — Other Ambulatory Visit: Payer: Self-pay

## 2021-08-15 ENCOUNTER — Ambulatory Visit (INDEPENDENT_AMBULATORY_CARE_PROVIDER_SITE_OTHER): Payer: Medicare Other | Admitting: Family

## 2021-08-15 ENCOUNTER — Encounter: Payer: Self-pay | Admitting: Family

## 2021-08-15 VITALS — BP 132/80 | HR 83 | Ht 65.0 in | Wt 149.2 lb

## 2021-08-15 DIAGNOSIS — G510 Bell's palsy: Secondary | ICD-10-CM | POA: Diagnosis not present

## 2021-08-15 DIAGNOSIS — R7989 Other specified abnormal findings of blood chemistry: Secondary | ICD-10-CM

## 2021-08-15 DIAGNOSIS — I10 Essential (primary) hypertension: Secondary | ICD-10-CM

## 2021-08-15 DIAGNOSIS — E1169 Type 2 diabetes mellitus with other specified complication: Secondary | ICD-10-CM | POA: Diagnosis not present

## 2021-08-15 DIAGNOSIS — E119 Type 2 diabetes mellitus without complications: Secondary | ICD-10-CM | POA: Diagnosis not present

## 2021-08-15 DIAGNOSIS — E785 Hyperlipidemia, unspecified: Secondary | ICD-10-CM | POA: Diagnosis not present

## 2021-08-16 LAB — CBC WITH DIFFERENTIAL/PLATELET
Basophils Absolute: 0 10*3/uL (ref 0.0–0.1)
Basophils Relative: 0.3 % (ref 0.0–3.0)
Eosinophils Absolute: 0 10*3/uL (ref 0.0–0.7)
Eosinophils Relative: 0.1 % (ref 0.0–5.0)
HCT: 37.5 % (ref 36.0–46.0)
Hemoglobin: 12.4 g/dL (ref 12.0–15.0)
Lymphocytes Relative: 5.8 % — ABNORMAL LOW (ref 12.0–46.0)
Lymphs Abs: 0.7 10*3/uL (ref 0.7–4.0)
MCHC: 33.1 g/dL (ref 30.0–36.0)
MCV: 83.1 fl (ref 78.0–100.0)
Monocytes Absolute: 0 10*3/uL — ABNORMAL LOW (ref 0.1–1.0)
Monocytes Relative: 0.2 % — ABNORMAL LOW (ref 3.0–12.0)
Neutro Abs: 10.8 10*3/uL — ABNORMAL HIGH (ref 1.4–7.7)
Neutrophils Relative %: 93.6 % — ABNORMAL HIGH (ref 43.0–77.0)
Platelets: 311 10*3/uL (ref 150.0–400.0)
RBC: 4.52 Mil/uL (ref 3.87–5.11)
RDW: 14.7 % (ref 11.5–15.5)
WBC: 11.6 10*3/uL — ABNORMAL HIGH (ref 4.0–10.5)

## 2021-08-16 LAB — COMPREHENSIVE METABOLIC PANEL
ALT: 19 U/L (ref 0–35)
AST: 24 U/L (ref 0–37)
Albumin: 4.5 g/dL (ref 3.5–5.2)
Alkaline Phosphatase: 55 U/L (ref 39–117)
BUN: 20 mg/dL (ref 6–23)
CO2: 24 mEq/L (ref 19–32)
Calcium: 9.6 mg/dL (ref 8.4–10.5)
Chloride: 98 mEq/L (ref 96–112)
Creatinine, Ser: 0.83 mg/dL (ref 0.40–1.20)
GFR: 71.83 mL/min (ref >60.00)
Glucose, Bld: 256 mg/dL — ABNORMAL HIGH (ref 70–99)
Potassium: 4.5 mEq/L (ref 3.5–5.1)
Sodium: 135 mEq/L (ref 135–145)
Total Bilirubin: 0.4 mg/dL (ref 0.2–1.2)
Total Protein: 7.6 g/dL (ref 6.0–8.3)

## 2021-08-16 LAB — LIPID PANEL
Cholesterol: 182 mg/dL (ref 0–200)
HDL: 37.9 mg/dL — ABNORMAL LOW (ref >39.00)
NonHDL: 143.81
Total CHOL/HDL Ratio: 5
Triglycerides: 229 mg/dL — ABNORMAL HIGH (ref 0.0–149.0)
VLDL: 45.8 mg/dL — ABNORMAL HIGH (ref 0.0–40.0)

## 2021-08-16 LAB — HEMOGLOBIN A1C: Hgb A1c MFr Bld: 6.4 % (ref 4.6–6.5)

## 2021-08-16 LAB — LDL CHOLESTEROL, DIRECT: Direct LDL: 108 mg/dL

## 2021-08-18 ENCOUNTER — Ambulatory Visit (INDEPENDENT_AMBULATORY_CARE_PROVIDER_SITE_OTHER): Payer: Medicare Other | Admitting: *Deleted

## 2021-08-18 DIAGNOSIS — I10 Essential (primary) hypertension: Secondary | ICD-10-CM

## 2021-08-18 DIAGNOSIS — K219 Gastro-esophageal reflux disease without esophagitis: Secondary | ICD-10-CM

## 2021-08-18 DIAGNOSIS — E785 Hyperlipidemia, unspecified: Secondary | ICD-10-CM

## 2021-08-18 DIAGNOSIS — E119 Type 2 diabetes mellitus without complications: Secondary | ICD-10-CM

## 2021-08-18 DIAGNOSIS — Z9889 Other specified postprocedural states: Secondary | ICD-10-CM

## 2021-08-18 DIAGNOSIS — R413 Other amnesia: Secondary | ICD-10-CM

## 2021-08-18 DIAGNOSIS — G510 Bell's palsy: Secondary | ICD-10-CM

## 2021-08-18 DIAGNOSIS — I6529 Occlusion and stenosis of unspecified carotid artery: Secondary | ICD-10-CM

## 2021-08-19 NOTE — Patient Instructions (Signed)
Visit Information  Current Barriers:   Acute Mental Health needs related to Anxiety and Caregiver Stress, requires Support, Education, Resources, Referrals and Care Coordination in order to meet unmet mental health needs. Clinical Goal(s):  Patient will work with LCSW to reduce and manage symptoms of Anxiety and Caregiver Stress.     Clinical Interventions:  Active Listening utilized, Emotional Support provided and Verbalization of Feelings encouraged.   Collaboration with Primary Care Physician, Dr. [name] regarding development and update of comprehensive plan of care as evidenced by provider attestation and co-signature. Collaboration with Primary Care Physician, Dr. [name] to report your recent development of Bell's Palsy while at your family reunion, perhaps precipitated by intense stress and anxiety. Collaboration with Primary Care Physician, Dr. [name] to request orders for home health physical and occupational therapy services, due to paralysis on the left side of your face, as a result of Bell's Palsy. Patient Goals/Self-Care Activities: Receive personal counseling with LCSW, on a weekly/bi-weekly basis, to reduce and manage symptoms of Anxiety and Caregiver Stress, until manageable.     Congratulations on an additional 7 pound weight loss, for a total of 28 pounds.   LCSW collaboration with Dr. Benay Pillow, Ophthalmologist at [facility] [phone]), to schedule an appointment for follow-up of Bell's Palsy symptoms (I.e left eye pain, redness, burning, swelling, drainage and blurred vision).   Earliest available appointment for Dr. Edison Pace is on 08/30/2021.   Contact LCSW directly (# [id number]) if you have questions, need assistance, or if additional social work needs are identified between now and our next scheduled telephone outreach call.   Follow-Up:  09/01/2021 at 9:30am    Patient verbalizes understanding of instructions provided today  and agrees to view in [place].   Telephone follow up appointment with care management team member scheduled for:  09/01/2021 at 9:30am  [place] Licensed Clinical Social Worker [place]  [phone]

## 2021-08-19 NOTE — Chronic Care Management (AMB) (Signed)
Chronic Care Management    Clinical Social Work Note  08/19/2021 Name: Tiffany Gregory MRN: [id number] DOB: [birth year]  Tiffany Gregory is a 70 y.o. year old female who is a primary care patient of [name], [name], FNP. The CCM team was consulted to assist the patient with chronic disease management and/or care coordination needs related to: Appointment Scheduling Needs, [employer], and Mental Health Counseling and Resources.   Engaged with patient by telephone for follow-up visit in response to provider referral for social work chronic care management and care coordination services.   Consent to Services:  The patient was given information about Chronic Care Management services, agreed to services, and gave verbal consent prior to initiation of services.  Please see initial visit note for detailed documentation.   Patient agreed to services and consent obtained.   Assessment: Review of patient past medical history, allergies, medications, and health status, including review of relevant consultants reports was performed today as part of a comprehensive evaluation and provision of chronic care management and care coordination services.     SDOH (Social Determinants of Health) assessments and interventions performed:    Advanced Directives Status: Not addressed in this encounter.  CCM Care Plan  Allergies  Allergen Reactions   Lemon Flavor Anaphylaxis    "my throat closes up"   [name] Other (See Comments)   Sulfa Antibiotics Other (See Comments)    Outpatient Encounter Medications as of 08/18/2021  Medication Sig Note   acetaminophen (TYLENOL) 325 MG tablet Take 650 mg by mouth every 6 (six) hours as needed.    Apoaequorin (PREVAGEN PO) Take 1 tablet by mouth daily. (Patient not taking: Reported on 08/15/2021)    aspirin EC 81 MG tablet Take 81 mg by mouth daily. Swallow whole.    atorvastatin (LIPITOR) 20 MG tablet Take 1 tablet (20 mg total) by mouth  daily.    Blood Glucose Monitoring Suppl (ONE TOUCH ULTRA 2) w/Device KIT by Does not apply route.    cyanocobalamin 1000 MCG tablet Take 1 tablet by mouth daily.    cyclobenzaprine (FLEXERIL) 5 MG tablet Take 1 tablet (5 mg total) by mouth at bedtime as needed for muscle spasms (will cause drowsiness.).    glucose blood test strip 1 each by Other route as needed for other. Use as instructed. One touch ultra blue in vitro strips    meloxicam (MOBIC) 7.5 MG tablet Take 7.5 mg by mouth in the morning and at bedtime. 04/07/2021: Taking once daily   metFORMIN (GLUCOPHAGE XR) 500 MG 24 hr tablet Take 1 tablet (500 mg total) by mouth daily with breakfast.    omeprazole (PRILOSEC) 20 MG capsule Take 2 capsules (40 mg total) by mouth daily.    OneTouch Delica Lancets 79T MISC by Does not apply route.    polyethylene glycol (MIRALAX / GLYCOLAX) 17 g packet Take 17 g by mouth daily.    predniSONE (DELTASONE) 20 MG tablet Take 60 mg by mouth daily.    ramipril (ALTACE) 10 MG capsule Take 2 capsules (20 mg total) by mouth daily.    Semaglutide, 1 MG/DOSE, (OZEMPIC, 1 MG/DOSE,) 4 MG/3ML SOPN Inject 1 mg into the skin once a week.    valACYclovir (VALTREX) 1000 MG tablet Take 1,000 mg by mouth 3 (three) times daily.    vitamin E 180 MG (400 UNITS) capsule Take 400 Units by mouth daily.    No facility-administered encounter medications on file as of 08/18/2021.    Patient Active Problem List  Diagnosis Date Noted   Nasal congestion 02/14/2021   Muscle spasm 01/24/2021   B12 deficiency 01/24/2021   Neck pain 01/24/2021   Fall 01/24/2021   Gastroesophageal reflux disease 11/29/2020   Memory loss 10/27/2020   History of craniotomy 10/27/2020   Family history of GERD 10/27/2020   Hyperlipidemia 06/22/2020   Elevated liver enzymes 06/22/2020   History of hepatitis B virus infection 06/22/2020   Overweight with body mass index (BMI) of 27 to 27.9 in adult 06/22/2020   Hepatic steatosis 06/15/2020    Encounter for medical examination to establish care 06/14/2020   Type 2 diabetes mellitus without complication, without long-term current use of insulin ([place]) 06/14/2020   Essential hypertension 06/14/2020   Skin lesion 06/14/2020    Conditions to be addressed/monitored: Anxiety and Caregiver Stress.  Limited Social Support and Limited Access to Caregiver.  Care Plan : LCSW Plan of Care  Updates made by Francis Gaines, LCSW since 08/19/2021 12:00 AM     Problem: Find Caregiver Support and Counseling Services in My Community.   Priority: High     Goal: Find Multimedia programmer and Counseling Services in My Community.   Start Date: 06/08/2021  Expected End Date: 10/06/2021  This Visit's Progress: On track  Recent Progress: On track  Priority: High  Note:   Current Barriers:   Acute Mental Health needs related to Anxiety and Caregiver Stress, requires Support, Education, Resources, Referrals and Care Coordination in order to meet unmet mental health needs. Clinical Goal(s):  Patient will work with LCSW to reduce and manage symptoms of Anxiety and Caregiver Stress.     Clinical Interventions:  Active Listening utilized, Emotional Support provided and Verbalization of Feelings encouraged.   Collaboration with Primary Care Physician, Dr. [name] regarding development and update of comprehensive plan of care as evidenced by provider attestation and co-signature. Collaboration with Primary Care Physician, Dr. [name] to report your recent development of Bell's Palsy while at your family reunion, perhaps precipitated by intense stress and anxiety. Collaboration with Primary Care Physician, Dr. [name] to request orders for home health physical and occupational therapy services, due to paralysis on the left side of your face, as a result of Bell's Palsy. Patient Goals/Self-Care Activities: Receive personal counseling with LCSW, on a weekly/bi-weekly basis, to  reduce and manage symptoms of Anxiety and Caregiver Stress, until manageable.     Congratulations on an additional 7 pound weight loss, for a total of 28 pounds.   LCSW collaboration with Dr. Benay Pillow, Ophthalmologist at [facility] [phone]), to schedule an appointment for follow-up of Bell's Palsy symptoms (I.e left eye pain, redness, burning, swelling, drainage and blurred vision).   Earliest available appointment for Dr. Edison Pace is on 08/30/2021.   Contact LCSW directly (# [id number]) if you have questions, need assistance, or if additional social work needs are identified between now and our next scheduled telephone outreach call.   Follow-Up:  09/01/2021 at 9:30am     [place] Clinical Social Worker [place]  [phone]

## 2021-08-20 NOTE — Progress Notes (Signed)
Acute Office Visit  Subjective:    Patient ID: Tiffany Gregory, female    DOB: 28-Dec-1950, 70 y.o.   MRN: [id number]  Chief Complaint  Patient presents with   Hospitalization Follow-up    HPI Patient is in today as a hospital fu from 08/12/2021. She was seen at a hospital in [place] after experiencing left sided facial drooping and being diagnosed with Bell's Palsy. She is currently prescribed and taking prednisone and valtrex.She continued to feel fatigued, worn out and like her equilibrium is off. She had a CT of the head that was normal. No extremity weakness or numbness. She has a family history significant for Bells Palsy in her daughter and aunt.    Past Medical History:  Diagnosis Date   Allergy    Asthma    Diabetes mellitus without complication (HCC)    Elevated liver enzymes    Hepatic steatosis    History of hepatitis B 1971   She had IV drug use experimentation and was hosptialized and reports treated.    Hyperlipidemia    Hypertension     Past Surgical History:  Procedure Laterality Date   ABDOMINAL HYSTERECTOMY     BREAST BIOPSY     CRANIECTOMY     NASAL FRACTURE SURGERY  1975   NASAL SINUS SURGERY  2010    Family History  Problem Relation Age of Onset   Diabetes Mother    Heart attack Mother    Diabetes Father    Heart disease Father    Hypertension Father    Diabetes Sister    COPD Sister    Heart disease Sister    Diabetes Brother    Heart attack Maternal Grandmother    Cancer Maternal Grandfather    Heart attack Paternal Grandmother    Cancer Paternal Grandfather    Heart attack Brother    Heart disease Brother     Social History   Socioeconomic History   Marital status: Married    Spouse name: Terica Yogi   Number of children: 2   Years of education: 16   Highest education level: Bachelor's degree (e.g., BA, AB, BS)  Occupational History   Occupation: self employed  Tobacco Use   Smoking status: Never    Passive exposure: Past    Smokeless tobacco: Never  Vaping Use   Vaping Use: Never used  Substance and Sexual Activity   Alcohol use: Not Currently   Drug use: Never   Sexual activity: Not Currently    Partners: Male  Other Topics Concern   Not on file  Social History Narrative   Not on file   Social Determinants of Health   Financial Resource Strain: Low Risk    Difficulty of Paying Living Expenses: Not hard at all  Food Insecurity: No Food Insecurity   Worried About Charity fundraiser in the Last Year: Never true   [place] in the Last Year: Never true  Transportation Needs: No Transportation Needs   Lack of Transportation (Medical): No   Lack of Transportation (Non-Medical): No  Physical Activity: Inactive   Days of Exercise per Week: 0 days   Minutes of Exercise per Session: 0 min  Stress: Stress Concern Present   Feeling of Stress : Rather much  Social Connections: Moderately Integrated   Frequency of Communication with Friends and Family: More than three times a week   Frequency of Social Gatherings with Friends and Family: More than three times a week  Attends Religious Services: 1 to 4 times per year   Active Member of Clubs or Organizations: No   Attends Archivist Meetings: Never   Marital Status: Married  Human resources officer Violence: Not At Risk   Fear of Current or Ex-Partner: No   Emotionally Abused: No   Physically Abused: No   Sexually Abused: No    Outpatient Medications Prior to Visit  Medication Sig Dispense Refill   acetaminophen (TYLENOL) 325 MG tablet Take 650 mg by mouth every 6 (six) hours as needed.     aspirin EC 81 MG tablet Take 81 mg by mouth daily. Swallow whole.     atorvastatin (LIPITOR) 20 MG tablet Take 1 tablet (20 mg total) by mouth daily. 90 tablet 1   Blood Glucose Monitoring Suppl (ONE TOUCH ULTRA 2) w/Device KIT by Does not apply route.     cyanocobalamin 1000 MCG tablet Take 1 tablet by mouth daily.     cyclobenzaprine (FLEXERIL) 5 MG  tablet Take 1 tablet (5 mg total) by mouth at bedtime as needed for muscle spasms (will cause drowsiness.). 30 tablet 0   glucose blood test strip 1 each by Other route as needed for other. Use as instructed. One touch ultra blue in vitro strips 100 each 3   meloxicam (MOBIC) 7.5 MG tablet Take 7.5 mg by mouth in the morning and at bedtime.     metFORMIN (GLUCOPHAGE XR) 500 MG 24 hr tablet Take 1 tablet (500 mg total) by mouth daily with breakfast. 90 tablet 1   omeprazole (PRILOSEC) 20 MG capsule Take 2 capsules (40 mg total) by mouth daily. 180 capsule 0   OneTouch Delica Lancets 53G MISC by Does not apply route.     polyethylene glycol (MIRALAX / GLYCOLAX) 17 g packet Take 17 g by mouth daily.     ramipril (ALTACE) 10 MG capsule Take 2 capsules (20 mg total) by mouth daily. 180 capsule 1   Semaglutide, 1 MG/DOSE, (OZEMPIC, 1 MG/DOSE,) 4 MG/3ML SOPN Inject 1 mg into the skin once a week. 3 mL 0   vitamin E 180 MG (400 UNITS) capsule Take 400 Units by mouth daily.     Apoaequorin (PREVAGEN PO) Take 1 tablet by mouth daily. (Patient not taking: Reported on 08/15/2021)     predniSONE (DELTASONE) 20 MG tablet Take 60 mg by mouth daily.     valACYclovir (VALTREX) 1000 MG tablet Take 1,000 mg by mouth 3 (three) times daily.     No facility-administered medications prior to visit.    Allergies  Allergen Reactions   Lemon Flavor Anaphylaxis    "my throat closes up"   [name] Other (See Comments)   Sulfa Antibiotics Other (See Comments)    Review of Systems  Constitutional: Negative.   HENT:  Negative for facial swelling.        Left sided facial paralysis   Eyes:        Left eye drooping, seeing strobes at time when her eye is dry  Respiratory: Negative.    Cardiovascular: Negative.   Gastrointestinal: Negative.   Musculoskeletal: Negative.   Skin: Negative.   Neurological:  Positive for facial asymmetry and numbness. Negative for weakness and light-headedness.       Left  sided facial numbness  Psychiatric/Behavioral: Negative.    All other systems reviewed and are negative.     Objective:    Physical Exam Vitals and nursing note reviewed.  Constitutional:      Appearance: Normal  appearance.  Eyes:     Pupils: Pupils are equal, round, and reactive to light.     Comments: Left sided eye drooping  Cardiovascular:     Rate and Rhythm: Normal rate.  Pulmonary:     Effort: Pulmonary effort is normal.     Breath sounds: Normal breath sounds.  Musculoskeletal:     Cervical back: Normal range of motion and neck supple.  Skin:    General: Skin is warm and dry.  Neurological:     Mental Status: She is oriented to person, place, and time.     Cranial Nerves: Cranial nerve deficit present.     Sensory: Sensory deficit present.     Motor: No weakness.     Coordination: Coordination normal.     Gait: Gait normal.     Deep Tendon Reflexes: Reflexes abnormal.     Comments: Facial nerve paralysis. Unable to smile, puff her cheeks or wrinkle her forehead symmetrically.   Psychiatric:        Mood and Affect: Mood normal.        Behavior: Behavior normal.    BP 132/80   Pulse 83   Ht 5' 5"  (1.651 m)   Wt 149 lb 3.2 oz (67.7 kg)   SpO2 97%   BMI 24.83 kg/m  Wt Readings from Last 3 Encounters:  08/15/21 149 lb 3.2 oz (67.7 kg)  02/14/21 159 lb (72.1 kg)  01/24/21 161 lb 3.2 oz (73.1 kg)    Health Maintenance Due  Topic Date Due   Zoster Vaccines- Shingrix (1 of 2) Never done   Pneumonia Vaccine 61+ Years old (2 - PPSV23 if available, else PCV20) 05/15/2018   COVID-19 Vaccine (4 - Booster for Moderna series) 10/22/2020   INFLUENZA VACCINE  05/08/2021    There are no preventive care reminders to display for this patient.   Lab Results  Component Value Date   TSH 1.920 10/27/2020   Lab Results  Component Value Date   WBC 11.6 (H) 08/15/2021   HGB 12.4 08/15/2021   HCT 37.5 08/15/2021   MCV 83.1 08/15/2021   PLT 311.0 08/15/2021   Lab  Results  Component Value Date   NA 135 08/15/2021   K 4.5 08/15/2021   CO2 24 08/15/2021   GLUCOSE 256 (H) 08/15/2021   BUN 20 08/15/2021   CREATININE 0.83 08/15/2021   BILITOT 0.4 08/15/2021   ALKPHOS 55 08/15/2021   AST 24 08/15/2021   ALT 19 08/15/2021   PROT 7.6 08/15/2021   ALBUMIN 4.5 08/15/2021   CALCIUM 9.6 08/15/2021   ANIONGAP 9 06/20/2020   EGFR 89 01/24/2021   GFR 71.83 08/15/2021   Lab Results  Component Value Date   CHOL 182 08/15/2021   Lab Results  Component Value Date   HDL 37.90 (L) 08/15/2021   Lab Results  Component Value Date   LDLCALC 74 10/27/2020   Lab Results  Component Value Date   TRIG 229.0 (H) 08/15/2021   Lab Results  Component Value Date   CHOLHDL 5 08/15/2021   Lab Results  Component Value Date   HGBA1C 6.4 08/15/2021       Assessment & Plan:   Problem List Items Addressed This Visit     Type 2 diabetes mellitus without complication, without long-term current use of insulin (HCC)   Relevant Orders   HgB A1c (Completed)   Comp Met (CMET) (Completed)   Essential hypertension   Relevant Orders   Comp Met (CMET) (Completed)  CBC w/Diff (Completed)   Other Visit Diagnoses     Bell's palsy    -  Primary   Relevant Orders   CBC w/Diff (Completed)   Elevated LFTs       Relevant Orders   Comp Met (CMET) (Completed)   Hyperlipidemia associated with type 2 diabetes mellitus ([place])       Relevant Orders   Lipid panel (Completed)      Continue current medications. Artificial tears for eye drooping. Labs obtained today. Advised that most of the paralysis should resolve. A limited number of people will have residua effects. Beware of signs of depression. Stay active but rest as needed. Call the office with questions or concerns. Follow-up pending labs and sooner as needed.    Kennyth Arnold, FNP

## 2021-08-21 ENCOUNTER — Telehealth: Payer: Self-pay | Admitting: Adult Health

## 2021-08-21 NOTE — Telephone Encounter (Signed)
Pt called in complaining about discomfort on her left side. Pt stated that she started having pain in the left ear, left jaw line, and swallowing glands. Pt stated that she have bells palsy. Pt would like to know what can she take for the discomfort. Pt is requesting callback.

## 2021-08-21 NOTE — Telephone Encounter (Signed)
Placed call to pt. Pt wanted appt with provider. Pt scheduled to see provider 11/15

## 2021-08-22 ENCOUNTER — Ambulatory Visit (INDEPENDENT_AMBULATORY_CARE_PROVIDER_SITE_OTHER): Payer: Medicare Other | Admitting: Adult Health

## 2021-08-22 ENCOUNTER — Ambulatory Visit
Admission: RE | Admit: 2021-08-22 | Discharge: 2021-08-22 | Disposition: A | Discharge status: Home / Self Care | Payer: Medicare Other | Source: Ambulatory Visit | Attending: Adult Health | Admitting: Adult Health

## 2021-08-22 ENCOUNTER — Encounter: Payer: Self-pay | Admitting: Adult Health

## 2021-08-22 ENCOUNTER — Other Ambulatory Visit: Payer: Self-pay

## 2021-08-22 ENCOUNTER — Other Ambulatory Visit: Payer: Self-pay | Admitting: Adult Health

## 2021-08-22 VITALS — BP 130/82 | HR 84 | Temp 96.1°F | Ht 65.0 in | Wt 146.8 lb

## 2021-08-22 DIAGNOSIS — G510 Bell's palsy: Secondary | ICD-10-CM

## 2021-08-22 DIAGNOSIS — R2689 Other abnormalities of gait and mobility: Secondary | ICD-10-CM | POA: Diagnosis not present

## 2021-08-22 DIAGNOSIS — Z9889 Other specified postprocedural states: Secondary | ICD-10-CM | POA: Insufficient documentation

## 2021-08-22 DIAGNOSIS — I6782 Cerebral ischemia: Secondary | ICD-10-CM | POA: Diagnosis not present

## 2021-08-22 DIAGNOSIS — H6502 Acute serous otitis media, left ear: Secondary | ICD-10-CM | POA: Insufficient documentation

## 2021-08-22 DIAGNOSIS — G529 Cranial nerve disorder, unspecified: Secondary | ICD-10-CM | POA: Diagnosis not present

## 2021-08-22 DIAGNOSIS — Z862 Personal history of diseases of the blood and blood-forming organs and certain disorders involving the immune mechanism: Secondary | ICD-10-CM | POA: Diagnosis not present

## 2021-08-22 DIAGNOSIS — G629 Polyneuropathy, unspecified: Secondary | ICD-10-CM | POA: Diagnosis not present

## 2021-08-22 DIAGNOSIS — E876 Hypokalemia: Secondary | ICD-10-CM

## 2021-08-22 LAB — COMPREHENSIVE METABOLIC PANEL
ALT: 16 U/L (ref 0–35)
AST: 14 U/L (ref 0–37)
Albumin: 4.2 g/dL (ref 3.5–5.2)
Alkaline Phosphatase: 56 U/L (ref 39–117)
BUN: 14 mg/dL (ref 6–23)
CO2: 31 mEq/L (ref 19–32)
Calcium: 9.1 mg/dL (ref 8.4–10.5)
Chloride: 97 mEq/L (ref 96–112)
Creatinine, Ser: 0.74 mg/dL (ref 0.40–1.20)
GFR: 82.43 mL/min (ref >60.00)
Glucose, Bld: 202 mg/dL — ABNORMAL HIGH (ref 70–99)
Potassium: 3.3 mEq/L — ABNORMAL LOW (ref 3.5–5.1)
Sodium: 136 mEq/L (ref 135–145)
Total Bilirubin: 0.5 mg/dL (ref 0.2–1.2)
Total Protein: 7 g/dL (ref 6.0–8.3)

## 2021-08-22 LAB — CBC WITH DIFFERENTIAL/PLATELET
Basophils Absolute: 0 10*3/uL (ref 0.0–0.1)
Basophils Relative: 0.6 % (ref 0.0–3.0)
Eosinophils Absolute: 0.1 10*3/uL (ref 0.0–0.7)
Eosinophils Relative: 1.8 % (ref 0.0–5.0)
HCT: 39.9 % (ref 36.0–46.0)
Hemoglobin: 13.2 g/dL (ref 12.0–15.0)
Lymphocytes Relative: 20.9 % (ref 12.0–46.0)
Lymphs Abs: 1.6 10*3/uL (ref 0.7–4.0)
MCHC: 33 g/dL (ref 30.0–36.0)
MCV: 84.1 fl (ref 78.0–100.0)
Monocytes Absolute: 0.3 10*3/uL (ref 0.1–1.0)
Monocytes Relative: 4.6 % (ref 3.0–12.0)
Neutro Abs: 5.5 10*3/uL (ref 1.4–7.7)
Neutrophils Relative %: 72.1 % (ref 43.0–77.0)
Platelets: 293 10*3/uL (ref 150.0–400.0)
RBC: 4.75 Mil/uL (ref 3.87–5.11)
RDW: 14.8 % (ref 11.5–15.5)
WBC: 7.6 10*3/uL (ref 4.0–10.5)

## 2021-08-22 LAB — SEDIMENTATION RATE: Sed Rate: 26 mm/hr (ref 0–30)

## 2021-08-22 MED ORDER — GADOBUTROL 1 MMOL/ML IV SOLN
6.0000 mL | Freq: Once | INTRAVENOUS | Status: AC | PRN
  Administered 2021-08-22: 6 mL via INTRAVENOUS

## 2021-08-22 MED ORDER — VALACYCLOVIR HCL 1 G PO TABS: 1000.0000 mg | ORAL_TABLET | Freq: Two times a day (BID) | ORAL | 0 refills | Status: DC

## 2021-08-22 MED ORDER — POTASSIUM CHLORIDE CRYS ER 10 MEQ PO TBCR: 10.0000 meq | EXTENDED_RELEASE_TABLET | Freq: Every day | ORAL | 0 refills | Status: DC

## 2021-08-22 MED ORDER — AMOXICILLIN-POT CLAVULANATE 875-125 MG PO TABS: 1.0000 | ORAL_TABLET | Freq: Two times a day (BID) | ORAL | 0 refills | Status: DC

## 2021-08-22 MED ORDER — PREDNISONE 10 MG (21) PO TBPK: ORAL_TABLET | ORAL | 0 refills | Status: DC

## 2021-08-22 NOTE — Progress Notes (Addendum)
`  Acute Office Visit  Subjective:    Patient ID: Tiffany Gregory, female    DOB: 30-Oct-1950, 70 y.o.   MRN: [id number]    HPI Patient is in today for follow up to [place] she had a CT scan on November 9th in [place] in ED.  Was diagnosed with Bells Palsy.  She is having left sided facial pain. Face is tender.  She feels the gland on the left side of face is swollen. She finished valacyclovir and also prednisone.  She has reports for being more uneven on her feet, denies any pain.  Today she feels it is getting better than yesterday.  Patient  denies any fever, body aches,chills, rash, chest pain, shortness of breath, nausea, vomiting, or diarrhea.  Had follow up with Dutch Quint NP on 08/15/2021.  History of craniotomy February 2021. Has been seen by neurology and neurologist.  Ct reviewed IMPRESSION: Soft tissue contusion or laceration above left eye. No acute other abnormality head or cervical spine.    Patient  denies any fever,chills, rash, chest pain, shortness of breath, nausea, vomiting, or diarrhea.   Status post right frontal craniotomy.   Cervical spondylosis.     Electronically Signed   By: Inge Rise M.D.   On: 10/19/2020 13:58   Reviewed MRI from 10/28/20 IMPRESSION: No evidence of acute intracranial abnormality. 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 Findings likely reflect chronic sequela of prior subdural hematomas given the provided history. Mild cerebral atrophy and chronic small vessel ischemic disease. Paranasal sinus disease a IMPRESSION: Soft tissue contusion or laceration above left eye. No acute other abnormality head or cervical spine.   Status post right frontal craniotomy.   Cervical spondylosis.     Electronically Signed   By: Inge Rise M.D.   On: 10/19/2020 13:58  IMPRESSION: 1. No CT evidence for acute intracranial abnormality. Status  post right craniotomy with dural thickening on the right which is stable with the previous examinations. 2. Mild white matter hypodensity, likely chronic small vessel ischemic change. Electronically Signed   By: Donavan Foil M.D.   On: 01/15/2021 21:06 Past Medical History:  Diagnosis Date   Allergy    Asthma    Diabetes mellitus without complication (HCC)    Elevated liver enzymes    Hepatic steatosis    History of hepatitis B 1971   She had IV drug use experimentation and was hosptialized and reports treated.    Hyperlipidemia    Hypertension     Past Surgical History:  Procedure Laterality Date   ABDOMINAL HYSTERECTOMY     BREAST BIOPSY     CRANIECTOMY     NASAL FRACTURE SURGERY  1975   NASAL SINUS SURGERY  2010    Family History  Problem Relation Age of Onset   Diabetes Mother    Heart attack Mother    Diabetes Father    Heart disease Father    Hypertension Father    Diabetes Sister    COPD Sister    Heart disease Sister    Diabetes Brother    Heart attack Maternal Grandmother    Cancer Maternal Grandfather    Heart attack Paternal Grandmother    Cancer Paternal Grandfather    Heart attack Brother    Heart disease Brother     Social History   Socioeconomic History   Marital status: Married    Spouse name: Marisha Renier   Number of children: 2  Years of education: 50   Highest education level: Bachelor's degree (e.g., BA, AB, BS)  Occupational History   Occupation: self employed  Tobacco Use   Smoking status: Never    Passive exposure: Past   Smokeless tobacco: Never  Vaping Use   Vaping Use: Never used  Substance and Sexual Activity   Alcohol use: Not Currently   Drug use: Never   Sexual activity: Not Currently    Partners: Male  Other Topics Concern   Not on file  Social History Narrative   Not on file   Social Determinants of Health   Financial Resource Strain: Low Risk    Difficulty of Paying Living Expenses: Not hard at all   Food Insecurity: No Food Insecurity   Worried About Charity fundraiser in the Last Year: Never true   [place] in the Last Year: Never true  Transportation Needs: No Transportation Needs   Lack of Transportation (Medical): No   Lack of Transportation (Non-Medical): No  Physical Activity: Inactive   Days of Exercise per Week: 0 days   Minutes of Exercise per Session: 0 min  Stress: Stress Concern Present   Feeling of Stress : Rather much  Social Connections: Moderately Integrated   Frequency of Communication with Friends and Family: More than three times a week   Frequency of Social Gatherings with Friends and Family: More than three times a week   Attends Religious Services: 1 to 4 times per year   Active Member of [employer] or Organizations: No   Attends Archivist Meetings: Never   Marital Status: Married  Human resources officer Violence: Not At Risk   Fear of Current or Ex-Partner: No   Emotionally Abused: No   Physically Abused: No   Sexually Abused: No    Outpatient Medications Prior to Visit  Medication Sig Dispense Refill   acetaminophen (TYLENOL) 325 MG tablet Take 650 mg by mouth every 6 (six) hours as needed.     Apoaequorin (PREVAGEN PO) Take 1 tablet by mouth daily.     aspirin EC 81 MG tablet Take 81 mg by mouth daily. Swallow whole.     atorvastatin (LIPITOR) 20 MG tablet Take 1 tablet (20 mg total) by mouth daily. 90 tablet 1   Blood Glucose Monitoring Suppl (ONE TOUCH ULTRA 2) w/Device KIT by Does not apply route.     cyanocobalamin 1000 MCG tablet Take 1 tablet by mouth daily.     glucose blood test strip 1 each by Other route as needed for other. Use as instructed. One touch ultra blue in vitro strips 100 each 3   meloxicam (MOBIC) 7.5 MG tablet Take 7.5 mg by mouth in the morning and at bedtime.     metFORMIN (GLUCOPHAGE XR) 500 MG 24 hr tablet Take 1 tablet (500 mg total) by mouth daily with breakfast. 90 tablet 1   omeprazole (PRILOSEC) 20 MG capsule  Take 2 capsules (40 mg total) by mouth daily. 180 capsule 0   OneTouch Delica Lancets 96V MISC by Does not apply route.     polyethylene glycol (MIRALAX / GLYCOLAX) 17 g packet Take 17 g by mouth daily.     ramipril (ALTACE) 10 MG capsule Take 2 capsules (20 mg total) by mouth daily. 180 capsule 1   Semaglutide, 1 MG/DOSE, (OZEMPIC, 1 MG/DOSE,) 4 MG/3ML SOPN Inject 1 mg into the skin once a week. 3 mL 0   vitamin E 180 MG (400 UNITS) capsule Take 400  Units by mouth daily.     valACYclovir (VALTREX) 1000 MG tablet Take 1,000 mg by mouth 3 (three) times daily.     cyclobenzaprine (FLEXERIL) 5 MG tablet Take 1 tablet (5 mg total) by mouth at bedtime as needed for muscle spasms (will cause drowsiness.). (Patient not taking: Reported on 08/22/2021) 30 tablet 0   predniSONE (DELTASONE) 20 MG tablet Take 60 mg by mouth daily. (Patient not taking: Reported on 08/22/2021)     No facility-administered medications prior to visit.    Allergies  Allergen Reactions   Lemon Flavor Anaphylaxis    "my throat closes up"   [name] Other (See Comments)   Sulfa Antibiotics Other (See Comments)    Review of Systems  Constitutional: Negative.   HENT:  Positive for ear pain. Negative for congestion, dental problem, drooling, ear discharge, facial swelling, hearing loss, mouth sores, nosebleeds, postnasal drip, rhinorrhea, sinus pressure, sinus pain, sneezing, sore throat, tinnitus, trouble swallowing and voice change.   Respiratory: Negative.    Cardiovascular: Negative.   Gastrointestinal: Negative.   Musculoskeletal:  Positive for myalgias. Negative for arthralgias, back pain, gait problem, joint swelling, neck pain and neck stiffness.  Skin: Negative.   Neurological: Negative.   Psychiatric/Behavioral: Negative.        Objective:    Physical Exam Vitals reviewed.  Constitutional:      General: She is not in acute distress.    Appearance: She is not ill-appearing, toxic-appearing or  diaphoretic.  HENT:     Head: Normocephalic and atraumatic.     Jaw: There is normal jaw occlusion.     Right Ear: Tympanic membrane, ear canal and external ear normal. There is no impacted cerumen. No foreign body. No mastoid tenderness. Tympanic membrane is not perforated or erythematous.     Left Ear: Tenderness present. No laceration, drainage or swelling. A middle ear effusion (dark fluid behind intact dull tympanic membrane) is present. There is no impacted cerumen. No foreign body. No mastoid tenderness. Tympanic membrane is erythematous. Tympanic membrane is not perforated.     Nose: Nose normal.     Mouth/Throat:     Lips: Pink.     Mouth: Mucous membranes are moist.     Tongue: No lesions.     Palate: No mass.     Pharynx: Oropharynx is clear. Uvula midline. No oropharyngeal exudate, posterior oropharyngeal erythema or uvula swelling.     Tonsils: No tonsillar exudate.  Eyes:     General: Gaze aligned appropriately. No visual field deficit.    Extraocular Movements: Extraocular movements intact.     Right eye: Normal extraocular motion.     Left eye: Normal extraocular motion.     Conjunctiva/sclera: Conjunctivae normal.     Comments: Mild watery drainage from left eye. Left eye slightly more closed than right.   Neck:     Thyroid: No thyroid mass, thyromegaly or thyroid tenderness.     Trachea: Trachea and phonation normal.  Cardiovascular:     Pulses: Normal pulses.     Heart sounds: Normal heart sounds. No murmur heard.   No friction rub. No gallop.  Pulmonary:     Effort: Pulmonary effort is normal. No respiratory distress.     Breath sounds: Normal breath sounds. No stridor. No wheezing, rhonchi or rales.  Chest:     Chest wall: No tenderness.  Abdominal:     General: There is no distension.     Palpations: Abdomen is soft.  Tenderness: There is no abdominal tenderness.  Musculoskeletal:     Cervical back: Full passive range of motion without pain, normal range  of motion and neck supple.  Lymphadenopathy:     Cervical: Cervical adenopathy present.     Right cervical: No superficial, deep or posterior cervical adenopathy.    Left cervical: Superficial cervical adenopathy present. No deep cervical adenopathy.  Neurological:     Mental Status: She is alert and oriented to person, place, and time.     GCS: GCS eye subscore is 4. GCS verbal subscore is 5. GCS motor subscore is 6.     Cranial Nerves: No cranial nerve deficit.     Sensory: Sensory deficit (numbness left side face since 08/16/21) present.     Motor: Weakness (generalized) present. No tremor, atrophy, abnormal muscle tone, seizure activity or pronator drift.     Coordination: Romberg sign negative. Coordination normal. Finger-Nose-Finger Test and Heel to [facility] Test normal.     Gait: Gait abnormal and tandem walk abnormal (heel to toe with swaying able to reposistion to normal).     Deep Tendon Reflexes: Reflexes normal.     Comments: Speech is normal. Accompanied by husband.  Strength bilateral extremities intact.  Facial droop left side mouth and eye.   Psychiatric:        Mood and Affect: Mood normal.        Behavior: Behavior normal.        Thought Content: Thought content normal.        Judgment: Judgment normal.     Filed Weights   08/22/21 0936  Weight: 146 lb 12.8 oz (66.6 kg)    BP 130/82   Pulse 84   Temp (!) 96.1 F (35.6 C)   Ht 5' 5"  (1.651 m)   Wt 146 lb 12.8 oz (66.6 kg)   SpO2 95%   BMI 24.43 kg/m  Wt Readings from Last 3 Encounters:  08/22/21 146 lb 12.8 oz (66.6 kg)  08/15/21 149 lb 3.2 oz (67.7 kg)  02/14/21 159 lb (72.1 kg)    Health Maintenance Due  Topic Date Due   Zoster Vaccines- Shingrix (1 of 2) Never done   Pneumonia Vaccine 68+ Years old (2 - PPSV23 if available, else PCV20) 05/15/2018   COVID-19 Vaccine (4 - Booster for Moderna series) 10/22/2020   INFLUENZA VACCINE  05/08/2021    There are no preventive care reminders to display for this  patient.   Lab Results  Component Value Date   TSH 1.920 10/27/2020   Lab Results  Component Value Date   WBC 11.6 (H) 08/15/2021   HGB 12.4 08/15/2021   HCT 37.5 08/15/2021   MCV 83.1 08/15/2021   PLT 311.0 08/15/2021   Lab Results  Component Value Date   NA 135 08/15/2021   K 4.5 08/15/2021   CO2 24 08/15/2021   GLUCOSE 256 (H) 08/15/2021   BUN 20 08/15/2021   CREATININE 0.83 08/15/2021   BILITOT 0.4 08/15/2021   ALKPHOS 55 08/15/2021   AST 24 08/15/2021   ALT 19 08/15/2021   PROT 7.6 08/15/2021   ALBUMIN 4.5 08/15/2021   CALCIUM 9.6 08/15/2021   ANIONGAP 9 06/20/2020   EGFR 89 01/24/2021   GFR 71.83 08/15/2021   Lab Results  Component Value Date   CHOL 182 08/15/2021   Lab Results  Component Value Date   HDL 37.90 (L) 08/15/2021   Lab Results  Component Value Date   LDLCALC 74 10/27/2020   Lab  Results  Component Value Date   TRIG 229.0 (H) 08/15/2021   Lab Results  Component Value Date   CHOLHDL 5 08/15/2021   Lab Results  Component Value Date   HGBA1C 6.4 08/15/2021       Assessment & Plan:   Problem List Items Addressed This Visit       Nervous and Auditory   Bell's palsy - Primary   Relevant Medications   valACYclovir (VALTREX) 1000 MG tablet   predniSONE (STERAPRED UNI-PAK 21 TAB) 10 MG (21) TBPK tablet   Other Relevant Orders   CBC with Differential/Platelet   Comprehensive metabolic panel   Sedimentation rate   MR Brain W Wo Contrast   Non-recurrent acute serous otitis media of left ear   Relevant Medications   valACYclovir (VALTREX) 1000 MG tablet   amoxicillin-clavulanate (AUGMENTIN) 875-125 MG tablet     Other   History of craniotomy   Relevant Orders   MR Brain W Wo Contrast   History of leukocytosis   Balance problem   Relevant Orders   MR Brain W Wo Contrast     Meds ordered this encounter  Medications   valACYclovir (VALTREX) 1000 MG tablet    Sig: Take 1 tablet (1,000 mg total) by mouth 2 (two) times daily.     Dispense:  20 tablet    Refill:  0   predniSONE (STERAPRED UNI-PAK 21 TAB) 10 MG (21) TBPK tablet    Sig: PO: Take 6 tablets on day 1:Take 5 tablets day 2:Take 4 tablets day 3: Take 3 tablets day 4:Take 2 tablets day five: 5 Take 1 tablet day 6    Dispense:  21 tablet    Refill:  0   amoxicillin-clavulanate (AUGMENTIN) 875-125 MG tablet    Sig: Take 1 tablet by mouth 2 (two) times daily.    Dispense:  20 tablet    Refill:  0   Orders Placed This Encounter  Procedures   MR Brain W Wo Contrast    Order Specific Question:   If indicated for the ordered procedure, I authorize the administration of contrast media per Radiology protocol    Answer:   Yes    Order Specific Question:   What is the patient's sedation requirement?    Answer:   No Sedation    Order Specific Question:   Does the patient have a pacemaker or implanted devices?    Answer:   No    Order Specific Question:   Preferred imaging location?    Answer:   [facility] (table limit - 550lbs)   CBC with Differential/Platelet   Comprehensive metabolic panel   Sedimentation rate    Advised patient to follow up with Dr. Manuella Ghazi. She will call today.  MRI advised given her history of craniotomy and CT only was done in [place] ER, 11/9 showed CBC leukocytosis will repeat today and check liver function.  Will repeat valtrex, dose to BID, prednisone taper pack, and give Augmentin for otitis media left.  Red Flags discussed. The patient was given clear instructions to go to ER or return to medical center if any red flags develop, symptoms do not improve, worsen or new problems develop. They verbalized understanding. Return in about 1 week (around 08/29/2021), or if symptoms worsen or fail to improve, for at any time for any worsening symptoms, Go to Emergency room/ urgent care if worse.   Marcille Buffy, FNP

## 2021-08-22 NOTE — Addendum Note (Signed)
Addended by: [name] on: 08/22/2021 10:19 AM   Modules accepted: Orders

## 2021-08-22 NOTE — Patient Instructions (Addendum)
Schedule follow up with Dr. Manuella Ghazi sooner if able please call them.  You should get a call for MRI.   Otitis Media, Adult Otitis media occurs when there is inflammation and fluid in the middle ear with signs and symptoms of an acute infection. The middle ear is a part of the ear that contains bones for hearing as well as air that helps send sounds to the brain. When infected fluid builds up in this space, it causes pressure and can lead to an ear infection. The eustachian tube connects the middle ear to the back of the nose (nasopharynx) and normally allows air into the middle ear. If the eustachian tube becomes blocked, fluid can build up and become infected. What are the causes? This condition is caused by a blockage in the eustachian tube. This can be caused by mucus or by swelling of the tube. Problems that can cause a blockage include: A cold or other upper respiratory infection. Allergies. An irritant, such as tobacco smoke. Enlarged adenoids. The adenoids are areas of soft tissue located high in the back of the throat, behind the nose and the roof of the mouth. They are part of the body's defense system (immune system). A mass in the nasopharynx. Damage to the ear caused by pressure changes (barotrauma). What increases the risk? You are more likely to develop this condition if you: Smoke or are exposed to tobacco smoke. Have an opening in the roof of your mouth (cleft palate). Have gastroesophageal reflux. Have an immune system disorder. What are the signs or symptoms? Symptoms of this condition include: Ear pain. Fever. Decreased hearing. Tiredness (lethargy). Fluid leaking from the ear, if the eardrum is ruptured or has burst. Ringing in the ear. How is this diagnosed? This condition is diagnosed with a physical exam. During the exam, your health care provider will use an instrument called an otoscope to look in your ear and check for redness, swelling, and fluid. He or she will  also ask about your symptoms. Your health care provider may also order tests, such as: A pneumatic otoscopy. This is a test to check the movement of the eardrum. It is done by squeezing a small amount of air into the ear. A tympanogram. This is a test that shows how well the eardrum moves in response to air pressure in the ear canal. It provides a graph for your health care provider to review. How is this treated? This condition can go away on its own within 3-5 days. But if the condition is caused by a bacterial infection and does not go away on its own, or if it keeps coming back, your health care provider may: Prescribe antibiotic medicine to treat the infection. Prescribe or recommend medicines to control pain. Follow these instructions at home: Take over-the-counter and prescription medicines only as told by your health care provider. If you were prescribed an antibiotic medicine, take it as told by your health care provider. Do not stop taking the antibiotic even if you start to feel better. Keep all follow-up visits. This is important. Contact a health care provider if: You have bleeding from your nose. There is a lump on your neck. You are not feeling better in 5 days. You feel worse instead of better. Get help right away if: You have severe pain that is not controlled with medicine. You have swelling, redness, or pain around your ear. You have stiffness in your neck. A part of your face is not moving (paralyzed). The  bone behind your ear (mastoid bone) is tender when you touch it. You develop a severe headache. Summary Otitis media is redness, soreness, and swelling of the middle ear, usually resulting in pain and decreased hearing. This condition can go away on its own within 3-5 days. If the problem does not go away in 3-5 days, your health care provider may give you medicines to treat the infection. If you were prescribed an antibiotic medicine, take it as told by your health  care provider. Follow all instructions that were given to you by your health care provider. This information is not intended to replace advice given to you by your health care provider. Make sure you discuss any questions you have with your health care provider. Document Revised: 01/02/2021 Document Reviewed: 01/02/2021 Elsevier Patient Education  [place]. Amoxicillin; Clavulanic Acid Tablets What is this medication? AMOXICILLIN; CLAVULANIC ACID (a mox i SIL in; KLAV yoo lan ic AS id) treats infections caused by bacteria. It belongs to a group of medications called penicillin antibiotics. It will not treat colds, the flu, or infections caused by viruses. This medicine may be used for other purposes; ask your health care provider or pharmacist if you have questions. COMMON BRAND NAME(S): Augmentin What should I tell my care team before I take this medication? They need to know if you have any of these conditions: Kidney disease Liver disease Mononucleosis Stomach or intestine problems such as colitis An unusual or allergic reaction to amoxicillin, other penicillin or cephalosporin antibiotics, clavulanic acid, other medications, foods, dyes, or preservatives Pregnant or trying to get pregnant Breast-feeding How should I use this medication? Take this medication by mouth. Take it as directed on the prescription label at the same time every day. Take it with food at the start of a meal or snack. Take all of this medication unless your care team tells you to stop it early. Keep taking it even if you think you are better. Talk to your care team about the use of this medication in children. While it may be prescribed for selected conditions, precautions do apply. Overdosage: If you think you have taken too much of this medicine contact a poison control center or emergency room at once. NOTE: This medicine is only for you. Do not share this medicine with others. What if I miss a dose? If  you miss a dose, take it as soon as you can. If it is almost time for your next dose, take only that dose. Do not take double or extra doses. What may interact with this medication? Allopurinol Anticoagulants Birth control pills Methotrexate Probenecid This list may not describe all possible interactions. Give your health care provider a list of all the medicines, herbs, non-prescription drugs, or dietary supplements you use. Also tell them if you smoke, drink alcohol, or use illegal drugs. Some items may interact with your medicine. What should I watch for while using this medication? Tell your care team if your symptoms do not start to get better or if they get worse. This medication may cause serious skin reactions. They can happen weeks to months after starting the medication. Contact your care team right away if you notice fevers or flu-like symptoms with a rash. The rash may be red or purple and then turn into blisters or peeling of the skin. Or, you might notice a red rash with swelling of the face, lips or lymph nodes in your neck or under your arms. Do not treat diarrhea with  over the counter products. Contact your care team if you have diarrhea that lasts more than 2 days or if it is severe and watery. If you have diabetes, you may get a false-positive result for sugar in your urine. Check with your care team. Birth control may not work properly while you are taking this medication. Talk to your care team about using an extra method of birth control. What side effects may I notice from receiving this medication? Side effects that you should report to your care team as soon as possible: Allergic reactions--skin rash, itching, hives, swelling of the face, lips, tongue, or throat Liver injury--right upper belly pain, loss of appetite, nausea, light-colored stool, dark yellow or brown urine, yellowing skin or eyes, unusual weakness or fatigue Redness, blistering, peeling, or loosening of the  skin, including inside the mouth Severe diarrhea, fever Unusual vaginal discharge, itching, or odor Side effects that usually do not require medical attention (report to your care team if they continue or are bothersome): Diarrhea Nausea Vomiting This list may not describe all possible side effects. Call your doctor for medical advice about side effects. You may report side effects to FDA at 1-800-FDA-1088. Where should I keep my medication? Keep out of the reach of children and pets. Store at room temperature between 20 and 25 degrees C (68 and 77 degrees F). Throw away any unused medication after the expiration date. NOTE: This sheet is a summary. It may not cover all possible information. If you have questions about this medicine, talk to your doctor, pharmacist, or health care provider.  2022 Elsevier/Gold Standard (2020-09-18 00:00:00) Prednisone Tablets What is this medication? PREDNISONE (PRED ni sone) treats many conditions such as asthma, allergic reactions, arthritis, inflammatory bowel diseases, adrenal, and blood or bone marrow disorders. It works by decreasing inflammation, slowing down an overactive immune system, or replacing cortisol normally made in the body. Cortisol is a hormone that plays an important role in how the body responds to stress, illness, and injury. It belongs to a group of medications called steroids. This medicine may be used for other purposes; ask your health care provider or pharmacist if you have questions. COMMON BRAND NAME(S): Deltasone, Predone, Sterapred, Sterapred DS What should I tell my care team before I take this medication? They need to know if you have any of these conditions: Cushing's syndrome Diabetes Glaucoma Heart disease High blood pressure Infection (especially a virus infection such as chickenpox, cold sores, or herpes) Kidney disease Liver disease Mental illness Myasthenia gravis Osteoporosis Seizures Stomach or intestine  problems Thyroid disease An unusual or allergic reaction to lactose, prednisone, other medications, foods, dyes, or preservatives Pregnant or trying to get pregnant Breast-feeding How should I use this medication? Take this medication by mouth with a glass of water. Follow the directions on the prescription label. Take this medication with food. If you are taking this medication once a day, take it in the morning. Do not take more medication than you are told to take. Do not suddenly stop taking your medication because you may develop a severe reaction. Your care team will tell you how much medication to take. If your care team wants you to stop the medication, the dose may be slowly lowered over time to avoid any side effects. Talk to your care team about the use of this medication in children. Special care may be needed. Overdosage: If you think you have taken too much of this medicine contact a poison control center or emergency  room at once. NOTE: This medicine is only for you. Do not share this medicine with others. What if I miss a dose? If you miss a dose, take it as soon as you can. If it is almost time for your next dose, talk to your care team. You may need to miss a dose or take an extra dose. Do not take double or extra doses without advice. What may interact with this medication? Do not take this medication with any of the following: Metyrapone Mifepristone This medication may also interact with the following: Aminoglutethimide Amphotericin B Aspirin and aspirin-like medications Barbiturates Certain medications for diabetes, like glipizide or glyburide Cholestyramine Cholinesterase inhibitors Cyclosporine Digoxin Diuretics Ephedrine Female hormones, like estrogens and birth control pills Isoniazid Ketoconazole NSAIDS, medications for pain and inflammation, like ibuprofen or naproxen Phenytoin Rifampin Toxoids Vaccines Warfarin This list may not describe all possible  interactions. Give your health care provider a list of all the medicines, herbs, non-prescription drugs, or dietary supplements you use. Also tell them if you smoke, drink alcohol, or use illegal drugs. Some items may interact with your medicine. What should I watch for while using this medication? Visit your care team for regular checks on your progress. If you are taking this medication over a prolonged period, carry an identification card with your name and address, the type and dose of your medication, and your care team's name and address. This medication may increase your risk of getting an infection. Tell your care team if you are around anyone with measles or chickenpox, or if you develop sores or blisters that do not heal properly. If you are going to have surgery, tell your care team that you have taken this medication within the last twelve months. Ask your care team about your diet. You may need to lower the amount of salt you eat. This medication may increase blood sugar. Ask your care team if changes in diet or medications are needed if you have diabetes. What side effects may I notice from receiving this medication? Side effects that you should report to your care team as soon as possible: Allergic reactions--skin rash, itching, hives, swelling of the face, lips, tongue, or throat Cushing syndrome--increased fat around the midsection, upper back, neck, or face, pink or purple stretch marks on the skin, thinning, fragile skin that easily bruises, unexpected hair growth High blood sugar (hyperglycemia)--increased thirst or amount of urine, unusual weakness or fatigue, blurry vision Increase in blood pressure Infection--fever, chills, cough, sore throat, wounds that don't heal, pain or trouble when passing urine, general feeling of discomfort or being unwell Low adrenal gland function--nausea, vomiting, loss of appetite, unusual weakness or fatigue, dizziness Mood and behavior  changes--anxiety, nervousness, confusion, hallucinations, irritability, hostility, thoughts of suicide or self-harm, worsening mood, feelings of depression Stomach bleeding--bloody or black, tar-like stools, vomiting blood or brown material that looks like coffee grounds Swelling of the ankles, hands, or feet Side effects that usually do not require medical attention (report to your care team if they continue or are bothersome): Acne General discomfort and fatigue Headache Increase in appetite Nausea Trouble sleeping Weight gain This list may not describe all possible side effects. Call your doctor for medical advice about side effects. You may report side effects to FDA at 1-800-FDA-1088. Where should I keep my medication? Keep out of the reach of children. Store at room temperature between 15 and 30 degrees C (59 and 86 degrees F). Protect from light. Keep container tightly closed. Throw away  any unused medication after the expiration date. NOTE: This sheet is a summary. It may not cover all possible information. If you have questions about this medicine, talk to your doctor, pharmacist, or health care provider.  2022 Elsevier/Gold Standard (2020-12-23 00:00:00) Valacyclovir Tablets What is this medication? VALACYCLOVIR (val ay SYE kloe veer) helps manage infections, such as cold sores, genital herpes, shingles, and chickenpox, caused by viruses. It will not treat colds, the flu, or infections caused by bacteria. This medicine may be used for other purposes; ask your health care provider or pharmacist if you have questions. COMMON BRAND NAME(S): Valtrex What should I tell my care team before I take this medication? They need to know if you have any of these conditions: Acquired immunodeficiency syndrome (AIDS) Any other condition that may weaken the immune system Bone marrow or kidney transplant Kidney disease An unusual or allergic reaction to valacyclovir, acyclovir, ganciclovir,  valganciclovir, other medications, foods, dyes, or preservatives Pregnant or trying to get pregnant Breast-feeding How should I use this medication? Take this medication by mouth with a glass of water. Follow the directions on the prescription label. You can take this medication with or without food. Take your doses at regular intervals. Do not take your medication more often than directed. Finish the full course prescribed by your care team even if you think your condition is better. Do not stop taking except on the advice of your care team. Talk to your care team about the use of this medication in children. While this medication may be prescribed for children as young as 2 years for selected conditions, precautions do apply. Overdosage: If you think you have taken too much of this medicine contact a poison control center or emergency room at once. NOTE: This medicine is only for you. Do not share this medicine with others. What if I miss a dose? If you miss a dose, take it as soon as you can. If it is almost time for your next dose, take only that dose. Do not take double or extra doses. What may interact with this medication? Do not take this medication with any of the following: Cidofovir This medication may also interact with the following: Adefovir Amphotericin B Certain antibiotics like amikacin, gentamicin, tobramycin, vancomycin Cimetidine Cisplatin Colistin Cyclosporine [place] Lithium Methotrexate Probenecid Tacrolimus This list may not describe all possible interactions. Give your health care provider a list of all the medicines, herbs, non-prescription drugs, or dietary supplements you use. Also tell them if you smoke, drink alcohol, or use illegal drugs. Some items may interact with your medicine. What should I watch for while using this medication? Tell your care team if your symptoms do not start to get better after 1 week. This medication works best when taken early  in the course of an infection, within the first 72 hours. Begin treatment as soon as possible after the first signs of infection like tingling, itching, or pain in the affected area. It is possible that genital herpes may still be spread even when you are not having symptoms. Always use safer sex practices like condoms made of latex or polyurethane whenever you have sexual contact. You should stay well hydrated while taking this medication. Drink plenty of fluids. What side effects may I notice from receiving this medication? Side effects that you should report to your care team as soon as possible: Allergic reactions--skin rash, itching, hives, swelling of the face, lips, tongue, or throat Confusion Hallucinations Kidney injury--decrease in the amount of  urine, swelling of the ankles, hands, or feet Seizures Side effects that usually do not require medical attention (report to your care team if they continue or are bothersome): Headache Nausea Stomach pain This list may not describe all possible side effects. Call your doctor for medical advice about side effects. You may report side effects to FDA at 1-800-FDA-1088. Where should I keep my medication? Keep out of the reach of children. Store at room temperature between 15 and 25 degrees C (59 and 77 degrees F). Keep container tightly closed. Throw away any unused medication after the expiration date. NOTE: This sheet is a summary. It may not cover all possible information. If you have questions about this medicine, talk to your doctor, pharmacist, or health care provider.  2022 Elsevier/Gold Standard (2020-12-02 00:00:00) Bell's Palsy, Adult Bell's palsy is a short-term inability to move muscles in a part of the face. The inability to move, also called paralysis, results from inflammation or compression of the seventh cranial nerve. This nerve travels along the skull and under the ear to the side of the face. This nerve is responsible for  facial movements that include blinking, closing the eyes, smiling, and frowning. What are the causes? The exact cause of this condition is not known. It may be caused by an infection from a virus, such as the chickenpox (herpes zoster), Epstein-Barr, or mumps virus. What increases the risk? You are more likely to develop this condition if: You are pregnant. You have diabetes. You have had a recent infection in your nose, throat, or airways. You have a weakened body defense system (immune system). You have had a facial injury, such as a fracture. You have a family history of Bell's palsy. What are the signs or symptoms? Symptoms of this condition include: Weakness on one side of the face. Drooping eyelid and corner of the mouth. Excessive tearing in one eye. Difficulty closing the eyelid. Dry eye. Drooling. Dry mouth. Changes in taste. Change in facial appearance. Pain behind one ear. Ringing in one or both ears. Sensitivity to sound in one ear. Facial twitching. Headache. Impaired speech. Dizziness. Difficulty eating or drinking. Most of the time, only one side of the face is affected. In rare cases, Bell's palsy may affect the whole face. How is this diagnosed? This condition is diagnosed based on: Your symptoms. Your medical history. A physical exam. You may also have to see health care providers who specialize in disorders of the nerves (neurologist) or diseases and conditions of the eye (ophthalmologist). You may have tests, such as: A test to check for nerve damage (electromyogram). Imaging studies, such as a CT scan or an MRI. Blood tests. How is this treated? This condition affects every person differently. Sometimes symptoms go away without treatment within a couple weeks. If treatment is needed, it varies from person to person. The goal of treatment is to reduce inflammation and protect the eye from damage. Treatment for Bell's palsy may include: Medicines, such  as: Steroids to reduce swelling and inflammation. Antiviral medicines. Pain relievers, including aspirin, acetaminophen, or ibuprofen. Eye drops or ointment to keep your eye moist. Eye protection, if you cannot close your eye. Exercises or massage to regain muscle strength and function (physical therapy). Follow these instructions at home:  Take over-the-counter and prescription medicines only as told by your health care provider. If your eye is affected: Keep your eye moist with eye drops or ointment as told by your health care provider. Follow instructions for eye care  and protection as told by your health care provider. Do any physical therapy exercises as told by your health care provider. Keep all follow-up visits. This is important. Contact a health care provider if: You have a fever or chills. Your symptoms do not get better within 2-3 weeks, or your symptoms get worse. Your eye is red, irritated, or painful. You have new symptoms. Get help right away if: You have weakness or numbness in a part of your body other than your face. You have trouble swallowing. You develop neck pain or stiffness. You develop dizziness or shortness of breath. Summary Bell's palsy is a short-term inability to move muscles in a part of the face. The inability to move results from inflammation or compression of the facial nerve. This condition affects every person differently. Sometimes symptoms go away without treatment within a couple weeks. If treatment is needed, it varies from person to person. The goal of treatment is to reduce inflammation and protect the eye from damage. Contact your health care provider if your symptoms do not get better within 2-3 weeks, or your symptoms get worse. This information is not intended to replace advice given to you by your health care provider. Make sure you discuss any questions you have with your health care provider. Document Revised: 06/23/2020 Document  Reviewed: 06/23/2020 Elsevier Patient Education  2022 [employer].

## 2021-08-22 NOTE — Progress Notes (Signed)
Potassium is mildly low, take potassium as sent to pharmacy.recheck CMP lab in one week. Increase in potassium rich foods/ give education. CBC is within normal. Sed rate within normal.  Glucose elevated, labs not fasting.   potassium chloride (KLOR-CON) 10 MEQ tablet   Sig: Take 1 tablet (10 mEq total) by mouth daily.   Dispense:  20 tablet   Refill:  0

## 2021-08-22 NOTE — Progress Notes (Signed)
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 Will let neurology and her neurosurgeon determine if MRI with contrast warranted. She is to follow up with Dr. Primitivo Gauze treatment for Bell's Palsy/  Mild chronic microvascular ischemic disease/  Dural thickening consistent with prior MRI.   CC results to her neurosurgeon and neurologist

## 2021-08-22 NOTE — Progress Notes (Signed)
Meds ordered this encounter  Medications   potassium chloride (KLOR-CON) 10 MEQ tablet    Sig: Take 1 tablet (10 mEq total) by mouth daily.    Dispense:  20 tablet    Refill:  0    Orders Placed This Encounter  Procedures   Comprehensive metabolic panel

## 2021-08-25 ENCOUNTER — Ambulatory Visit: Payer: Medicare Other | Admitting: Pharmacist

## 2021-08-25 DIAGNOSIS — E1169 Type 2 diabetes mellitus with other specified complication: Secondary | ICD-10-CM

## 2021-08-25 DIAGNOSIS — I10 Essential (primary) hypertension: Secondary | ICD-10-CM

## 2021-08-25 DIAGNOSIS — G510 Bell's palsy: Secondary | ICD-10-CM

## 2021-08-25 DIAGNOSIS — E785 Hyperlipidemia, unspecified: Secondary | ICD-10-CM

## 2021-08-25 DIAGNOSIS — E119 Type 2 diabetes mellitus without complications: Secondary | ICD-10-CM

## 2021-08-25 NOTE — Chronic Care Management (AMB) (Signed)
Chronic Care Management CCM Pharmacy Note  08/25/2021 Name:  Tiffany Gregory MRN:  [id number] DOB:  [birth year]  Summary: - S/p Bell's Palsy diagnosis. A1c well controlled  Recommendations/Changes made from today's visit: - Recommended to continue current regimen at this time  Subjective: Tiffany Gregory is an 70 y.o. year old female who is a primary patient of [name], [name], FNP.  The CCM team was consulted for assistance with disease management and care coordination needs.    Engaged with patient by telephone for follow up visit for pharmacy case management and/or care coordination services.   Objective:  Medications Reviewed Today     Reviewed by [name], FNP (Family Nurse Practitioner) on 08/22/21 at 1013  Med List Status: <None>   Medication Order Taking? Sig Documenting Provider Last Dose Status Informant  acetaminophen (TYLENOL) 325 MG tablet [id number] Yes Take 650 mg by mouth every 6 (six) hours as needed. [provider] Taking Active   amoxicillin-clavulanate (AUGMENTIN) 875-125 MG tablet [id number] Yes Take 1 tablet by mouth 2 (two) times daily. [name], [name], FNP  Active   Apoaequorin (PREVAGEN PO) [id number] Yes Take 1 tablet by mouth daily. [provider] Taking Active   aspirin EC 81 MG tablet [id number] Yes Take 81 mg by mouth daily. Swallow whole. [provider] Taking Active   atorvastatin (LIPITOR) 20 MG tablet [id number] Yes Take 1 tablet (20 mg total) by mouth daily. [name], [name], FNP Taking Active   Blood Glucose Monitoring Suppl (ONE TOUCH ULTRA 2) w/Device KIT [id number] Yes by Does not apply route. [provider] Taking Active   cyanocobalamin 1000 MCG tablet [id number] Yes Take 1 tablet by mouth daily. [provider] Taking Active   cyclobenzaprine (FLEXERIL) 5 MG tablet [id number] No Take 1 tablet (5 mg total) by mouth at bedtime as needed for muscle spasms (will cause drowsiness.).   Patient not taking: Reported on 08/22/2021   [name], FNP Not Taking Consider Medication Status and Discontinue   glucose blood test strip [id number] Yes 1 each by Other route as needed for other. Use as instructed. One touch ultra blue in vitro strips [name], [name], FNP Taking Active   meloxicam (MOBIC) 7.5 MG tablet [id number] Yes Take 7.5 mg by mouth in the morning and at bedtime. [provider] Taking Active            Med Note Darnelle Maffucci, Arville Lime   Fri Apr 07, 2021  9:53 AM) Taking once daily  metFORMIN (GLUCOPHAGE XR) 500 MG 24 hr tablet [id number] Yes Take 1 tablet (500 mg total) by mouth daily with breakfast. [name], MD Taking Active   omeprazole (PRILOSEC) 20 MG capsule [id number] Yes Take 2 capsules (40 mg total) by mouth daily. [name], [name], FNP Taking Active   OneTouch Delica Lancets 80X MISC [id number] Yes by Does not apply route. [provider] Taking Active   polyethylene glycol (MIRALAX / GLYCOLAX) 17 g packet [id number] Yes Take 17 g by mouth daily. [provider] Taking Active   predniSONE (STERAPRED UNI-PAK 21 TAB) 10 MG (21) TBPK tablet [id number] Yes PO: Take 6 tablets on day 1:Take 5 tablets day 2:Take 4 tablets day 3: Take 3 tablets day 4:Take 2 tablets day five: 5 Take 1 tablet day 6 [name], [name], FNP  Active   ramipril (ALTACE) 10 MG capsule [id number] Yes Take 2 capsules (20 mg total) by mouth daily. [name], MD Taking Active  Semaglutide, 1 MG/DOSE, (OZEMPIC, 1 MG/DOSE,) 4 MG/3ML SOPN [id number] Yes Inject 1 mg into the skin once a week. [name], MD Taking Active   valACYclovir (VALTREX) 1000 MG tablet [id number]  Take 1 tablet (1,000 mg total) by mouth 2 (two) times daily. [name], [name], FNP  Active   vitamin E 180 MG (400 UNITS) capsule [id number] Yes Take 400 Units by mouth daily. [provider] Taking Active             Pertinent Labs:   Lab Results  Component  Value Date   HGBA1C 6.4 08/15/2021   Lab Results  Component Value Date   CHOL 182 08/15/2021   HDL 37.90 (L) 08/15/2021   LDLCALC 74 10/27/2020   LDLDIRECT 108.0 08/15/2021   TRIG 229.0 (H) 08/15/2021   CHOLHDL 5 08/15/2021   Lab Results  Component Value Date   CREATININE 0.74 08/22/2021   BUN 14 08/22/2021   NA 136 08/22/2021   K 3.3 (L) 08/22/2021   CL 97 08/22/2021   CO2 31 08/22/2021    SDOH:  (Social Determinants of Health) assessments and interventions performed:  SDOH Interventions    Flowsheet Row Most Recent Value  SDOH Interventions   Financial Strain Interventions Other (Comment)  [manufacturer assistance]       CCM Care Plan  Review of patient past medical history, allergies, medications, health status, including review of consultants reports, laboratory and other test data, was performed as part of comprehensive evaluation and provision of chronic care management services.   Care Plan : Medication Management  Updates made by [name], RPH-CPP since 08/25/2021 12:00 AM     Problem: Diabetes, Hypertension      Long-Range Goal: Disease Progression Prevention   Start Date: 07/13/2020  Recent Progress: On track  Priority: High  Note:   Current Barriers:  Concerns regarding her ability to independently afford treatment regimen Unable to achieve control of diabetes  Complex medical history including hx hepatitis B, history of intracranial hemorrhage   Pharmacist Clinical Goal(s):  Over the next 90 days, patient will verbalize ability to afford treatment regimen through collaboration with PharmD and provider. Over the next 90 days, patient will achieve control of diabetes as evidenced by improvement in A1c through collaboration with PharmD and provider   Interventions: 1:1 collaboration with [name], [name], FNP regarding development and update of comprehensive plan of care as evidenced by provider attestation and  co-signature Inter-disciplinary care team collaboration (see longitudinal plan of care) Comprehensive medication review performed; medication list updated in electronic medical record  Health Maintenance: Due for Shingrix, pneumonia. Will discuss moving forward.   Acute Updates: S/p Bell's Palsy diagnosis. Improving. Reports continued eye burning sensation, trying blue light glasses to see if this helps. Can't use straw, can't split.  Finishing amoxicillin/clavulanate, prednisone, valacyclovir scripts. Follow up with PCP next week.   Diabetes: Controlled; current treatment: metformin XR 500 mg QAM, Ozempic 1 mg weekly Notes baseline constipation, but no worsening with Ozempic.  Approved for Ozempic assistance through [employer] through 2022 Hx glimepiride (d/c d/t hypoglycemia)  Current glucose readings: fasting: 89-120s; post prandial: generally <180 except if a large serving of sweets Congratulated on achievement of goal A1c. Recommended to continue current regimen at this time Will collaborate w/ CPhT, patient, and providers to reapply for patient assistance for [place] for 2023.  Hypertension: Controlled per last office visit; current treatment: ramipril 20 mg daily Previously on HCTZ, d/c d/t hyponatremia Home blood pressure readings:  did not discuss today Previously recommended to continue current regimen  Hyperlipidemia and ASCVD risk reduction: Controlled around goal of 70; current treatment: atorvastatin 20 mg daily  Antiplatelet therapy: aspirin 81 mg daily  Previously recommended to continue current regimen at this time. Could consider increasing intensity of statin to target LDL <70 given diabetes w/ risk factors of high blood pressure  Hx esophageal stricture (per patient report): Controlled per patient report; current regimen: omeprazole 20 mg daily;  Reports hx of esophageal stricture/narrowing requiring stretching, was advised to stay on PPI therapy for  life Previously recommended to continue current regimen  Arthritis: Moderately well controlled per patient report; current regimen: meloxicam 7.5 mg QAM (prescribed BID but only remembering to take QAM); acetaminophen 325 mg PRN, Previously reviewed to avoid combining ibuprofen and meloxicam due to long term renal, CV, GI effects of NSAID therapy. Previously recommended to continue current regimen   Supplements: Vitamin B12, Vitamin E   Patient Goals/Self-Care Activities Over the next 90 days, patient will:  - take medications as prescribed check blood glucose twice daily, document, and provide at future appointments      Plan: Telephone follow up appointment with care management team member scheduled for:  3 months  Catie Darnelle Maffucci, PharmD, [place], [place] Clinical Pharmacist [employer] at [employer] [phone]

## 2021-08-25 NOTE — Patient Instructions (Signed)
Visit Information Patient Goals/Self-Care Activities Over the next 90 days, patient will:  - take medications as prescribed check blood glucose twice daily, document, and provide at future appointments        Plan: Telephone follow up appointment with care management team member scheduled for:  3 months   Catie Darnelle Maffucci, PharmD, [place], CPP Clinical Pharmacist [place] at [facility] [phone]     Patient verbalizes understanding of instructions provided today and agrees to view in [place].

## 2021-08-29 ENCOUNTER — Ambulatory Visit (INDEPENDENT_AMBULATORY_CARE_PROVIDER_SITE_OTHER): Payer: Medicare Other | Admitting: Adult Health

## 2021-08-29 ENCOUNTER — Telehealth: Payer: Self-pay | Admitting: Pharmacy Technician

## 2021-08-29 ENCOUNTER — Encounter: Payer: Self-pay | Admitting: Adult Health

## 2021-08-29 ENCOUNTER — Other Ambulatory Visit: Payer: Self-pay

## 2021-08-29 VITALS — BP 138/86 | HR 83 | Temp 95.6°F | Ht 65.0 in | Wt 145.8 lb

## 2021-08-29 DIAGNOSIS — G510 Bell's palsy: Secondary | ICD-10-CM | POA: Diagnosis not present

## 2021-08-29 DIAGNOSIS — Z9889 Other specified postprocedural states: Secondary | ICD-10-CM

## 2021-08-29 DIAGNOSIS — M792 Neuralgia and neuritis, unspecified: Secondary | ICD-10-CM | POA: Diagnosis not present

## 2021-08-29 DIAGNOSIS — Z596 Low income: Secondary | ICD-10-CM

## 2021-08-29 DIAGNOSIS — E876 Hypokalemia: Secondary | ICD-10-CM | POA: Diagnosis not present

## 2021-08-29 DIAGNOSIS — T732XXS Exhaustion due to exposure, sequela: Secondary | ICD-10-CM | POA: Diagnosis not present

## 2021-08-29 DIAGNOSIS — T732XXA Exhaustion due to exposure, initial encounter: Secondary | ICD-10-CM | POA: Insufficient documentation

## 2021-08-29 DIAGNOSIS — M62838 Other muscle spasm: Secondary | ICD-10-CM

## 2021-08-29 LAB — CBC WITH DIFFERENTIAL/PLATELET
Basophils Absolute: 0 10*3/uL (ref 0.0–0.1)
Basophils Relative: 0.5 % (ref 0.0–3.0)
Eosinophils Absolute: 0.2 10*3/uL (ref 0.0–0.7)
Eosinophils Relative: 1.8 % (ref 0.0–5.0)
HCT: 40.4 % (ref 36.0–46.0)
Hemoglobin: 13.1 g/dL (ref 12.0–15.0)
Lymphocytes Relative: 21.1 % (ref 12.0–46.0)
Lymphs Abs: 1.8 10*3/uL (ref 0.7–4.0)
MCHC: 32.6 g/dL (ref 30.0–36.0)
MCV: 84.8 fl (ref 78.0–100.0)
Monocytes Absolute: 0.3 10*3/uL (ref 0.1–1.0)
Monocytes Relative: 3.6 % (ref 3.0–12.0)
Neutro Abs: 6.3 10*3/uL (ref 1.4–7.7)
Neutrophils Relative %: 73 % (ref 43.0–77.0)
Platelets: 279 10*3/uL (ref 150.0–400.0)
RBC: 4.76 Mil/uL (ref 3.87–5.11)
RDW: 15 % (ref 11.5–15.5)
WBC: 8.7 10*3/uL (ref 4.0–10.5)

## 2021-08-29 LAB — COMPREHENSIVE METABOLIC PANEL
ALT: 18 U/L (ref 0–35)
AST: 17 U/L (ref 0–37)
Albumin: 4.2 g/dL (ref 3.5–5.2)
Alkaline Phosphatase: 56 U/L (ref 39–117)
BUN: 14 mg/dL (ref 6–23)
CO2: 29 mEq/L (ref 19–32)
Calcium: 9.2 mg/dL (ref 8.4–10.5)
Chloride: 98 mEq/L (ref 96–112)
Creatinine, Ser: 0.84 mg/dL (ref 0.40–1.20)
GFR: 70.79 mL/min (ref >60.00)
Glucose, Bld: 169 mg/dL — ABNORMAL HIGH (ref 70–99)
Potassium: 3.7 mEq/L (ref 3.5–5.1)
Sodium: 135 mEq/L (ref 135–145)
Total Bilirubin: 0.5 mg/dL (ref 0.2–1.2)
Total Protein: 6.8 g/dL (ref 6.0–8.3)

## 2021-08-29 LAB — VITAMIN D 25 HYDROXY (VIT D DEFICIENCY, FRACTURES): VITD: 28.08 ng/mL — ABNORMAL LOW (ref 30.00–100.00)

## 2021-08-29 LAB — B12 AND FOLATE PANEL
Folate: 11 ng/mL (ref >5.9)
Vitamin B-12: >1550 pg/mL — ABNORMAL HIGH (ref 211–911)

## 2021-08-29 MED ORDER — GABAPENTIN 100 MG PO CAPS: 100.0000 mg | ORAL_CAPSULE | Freq: Three times a day (TID) | ORAL | 0 refills | Status: DC

## 2021-08-29 MED ORDER — CYCLOBENZAPRINE HCL 10 MG PO TABS: 10.0000 mg | ORAL_TABLET | Freq: Every day | ORAL | 0 refills | Status: DC

## 2021-08-29 NOTE — Patient Instructions (Signed)
Bell's Palsy, Adult Bell's palsy is a short-term inability to move muscles in a part of the face. The inability to move, also called paralysis, results from inflammation or compression of the seventh cranial nerve. This nerve travels along the skull and under the ear to the side of the face. This nerve is responsible for facial movements that include blinking, closing the eyes, smiling, and frowning. What are the causes? The exact cause of this condition is not known. It may be caused by an infection from a virus, such as the chickenpox (herpes zoster), Epstein-Barr, or mumps virus. What increases the risk? You are more likely to develop this condition if: You are pregnant. You have diabetes. You have had a recent infection in your nose, throat, or airways. You have a weakened body defense system (immune system). You have had a facial injury, such as a fracture. You have a family history of Bell's palsy. What are the signs or symptoms? Symptoms of this condition include: Weakness on one side of the face. Drooping eyelid and corner of the mouth. Excessive tearing in one eye. Difficulty closing the eyelid. Dry eye. Drooling. Dry mouth. Changes in taste. Change in facial appearance. Pain behind one ear. Ringing in one or both ears. Sensitivity to sound in one ear. Facial twitching. Headache. Impaired speech. Dizziness. Difficulty eating or drinking. Most of the time, only one side of the face is affected. In rare cases, Bell's palsy may affect the whole face. How is this diagnosed? This condition is diagnosed based on: Your symptoms. Your medical history. A physical exam. You may also have to see health care providers who specialize in disorders of the nerves (neurologist) or diseases and conditions of the eye (ophthalmologist). You may have tests, such as: A test to check for nerve damage (electromyogram). Imaging studies, such as a CT scan or an MRI. Blood tests. How is this  treated? This condition affects every person differently. Sometimes symptoms go away without treatment within a couple weeks. If treatment is needed, it varies from person to person. The goal of treatment is to reduce inflammation and protect the eye from damage. Treatment for Bell's palsy may include: Medicines, such as: Steroids to reduce swelling and inflammation. Antiviral medicines. Pain relievers, including aspirin, acetaminophen, or ibuprofen. Eye drops or ointment to keep your eye moist. Eye protection, if you cannot close your eye. Exercises or massage to regain muscle strength and function (physical therapy). Follow these instructions at home:  Take over-the-counter and prescription medicines only as told by your health care provider. If your eye is affected: Keep your eye moist with eye drops or ointment as told by your health care provider. Follow instructions for eye care and protection as told by your health care provider. Do any physical therapy exercises as told by your health care provider. Keep all follow-up visits. This is important. Contact a health care provider if: You have a fever or chills. Your symptoms do not get better within 2-3 weeks, or your symptoms get worse. Your eye is red, irritated, or painful. You have new symptoms. Get help right away if: You have weakness or numbness in a part of your body other than your face. You have trouble swallowing. You develop neck pain or stiffness. You develop dizziness or shortness of breath. Summary Bell's palsy is a short-term inability to move muscles in a part of the face. The inability to move results from inflammation or compression of the facial nerve. This condition affects every person differently.  Sometimes symptoms go away without treatment within a couple weeks. If treatment is needed, it varies from person to person. The goal of treatment is to reduce inflammation and protect the eye from damage. Contact  your health care provider if your symptoms do not get better within 2-3 weeks, or your symptoms get worse. This information is not intended to replace advice given to you by your health care provider. Make sure you discuss any questions you have with your health care provider. Document Revised: 06/23/2020 Document Reviewed: 06/23/2020 Elsevier Patient Education  [place]. Gabapentin Capsules or Tablets What is this medication? GABAPENTIN (GA ba pen tin) treats nerve pain. It may also be used to prevent and control seizures in people with epilepsy. It works by calming overactive nerves in your body. This medicine may be used for other purposes; ask your health care provider or pharmacist if you have questions. COMMON BRAND NAME(S): Active-PAC with Gabapentin, Orpha Bur, Gralise, Neurontin What should I tell my care team before I take this medication? They need to know if you have any of these conditions: Alcohol or substance use disorder Kidney disease Lung or breathing disease Suicidal thoughts, plans, or attempt; a previous suicide attempt by you or a family member An unusual or allergic reaction to gabapentin, other medications, foods, dyes, or preservatives Pregnant or trying to get pregnant Breast-feeding How should I use this medication? Take this medication by mouth with a glass of water. Follow the directions on the prescription label. You can take it with or without food. If it upsets your stomach, take it with food. Take your medication at regular intervals. Do not take it more often than directed. Do not stop taking except on your care team's advice. If you are directed to break the 600 or 800 mg tablets in half as part of your dose, the extra half tablet should be used for the next dose. If you have not used the extra half tablet within 28 days, it should be thrown away. A special MedGuide will be given to you by the pharmacist with each prescription and refill. Be sure to  read this information carefully each time. Talk to your care team about the use of this medication in children. While this medication may be prescribed for children as young as 3 years for selected conditions, precautions do apply. Overdosage: If you think you have taken too much of this medicine contact a poison control center or emergency room at once. NOTE: This medicine is only for you. Do not share this medicine with others. What if I miss a dose? If you miss a dose, take it as soon as you can. If it is almost time for your next dose, take only that dose. Do not take double or extra doses. What may interact with this medication? Alcohol Antihistamines for allergy, cough, and cold Certain medications for anxiety or sleep Certain medications for depression like amitriptyline, fluoxetine, sertraline Certain medications for seizures like phenobarbital, primidone Certain medications for stomach problems General anesthetics like halothane, isoflurane, methoxyflurane, propofol Local anesthetics like lidocaine, pramoxine, tetracaine Medications that relax muscles for surgery Opioid medications for pain Phenothiazines like chlorpromazine, mesoridazine, prochlorperazine, thioridazine This list may not describe all possible interactions. Give your health care provider a list of all the medicines, herbs, non-prescription drugs, or dietary supplements you use. Also tell them if you smoke, drink alcohol, or use illegal drugs. Some items may interact with your medicine. What should I watch for while using this medication? Visit  your care team for regular checks on your progress. You may want to keep a record at home of how you feel your condition is responding to treatment. You may want to share this information with your care team at each visit. You should contact your care team if your seizures get worse or if you have any new types of seizures. Do not stop taking this medication or any of your seizure  medications unless instructed by your care team. Stopping your medication suddenly can increase your seizures or their severity. This medication may cause serious skin reactions. They can happen weeks to months after starting the medication. Contact your care team right away if you notice fevers or flu-like symptoms with a rash. The rash may be red or purple and then turn into blisters or peeling of the skin. Or, you might notice a red rash with swelling of the face, lips or lymph nodes in your neck or under your arms. Wear a medical identification bracelet or chain if you are taking this medication for seizures. Carry a card that lists all your medications. This medication may affect your coordination, reaction time, or judgment. Do not drive or operate machinery until you know how this medication affects you. Sit up or stand slowly to reduce the risk of dizzy or fainting spells. Drinking alcohol with this medication can increase the risk of these side effects. Your mouth may get dry. Chewing sugarless gum or sucking hard candy, and drinking plenty of water may help. Watch for new or worsening thoughts of suicide or depression. This includes sudden changes in mood, behaviors, or thoughts. These changes can happen at any time but are more common in the beginning of treatment or after a change in dose. Call your care team right away if you experience these thoughts or worsening depression. If you become pregnant while using this medication, you may enroll in the [place] Pregnancy Registry by calling [phone]. This registry collects information about the safety of antiepileptic medication use during pregnancy. What side effects may I notice from receiving this medication? Side effects that you should report to your care team as soon as possible: Allergic reactions or angioedema--skin rash, itching, hives, swelling of the face, eyes, lips, tongue, arms, or legs, trouble  swallowing or breathing Rash, fever, and swollen lymph nodes Thoughts of suicide or self harm, worsening mood, feelings of depression Trouble breathing Unusual changes in mood or behavior in children after use such as difficulty concentrating, hostility, or restlessness Side effects that usually do not require medical attention (report to your care team if they continue or are bothersome): Dizziness Drowsiness Nausea Swelling of ankles, feet, or hands Vomiting This list may not describe all possible side effects. Call your doctor for medical advice about side effects. You may report side effects to FDA at 1-800-FDA-1088. Where should I keep my medication? Keep out of reach of children and pets. Store at room temperature between 15 and 30 degrees C (59 and 86 degrees F). Get rid of any unused medication after the expiration date. This medication may cause accidental overdose and death if taken by other adults, children, or pets. To get rid of medications that are no longer needed or have expired: Take the medication to a medication take-back program. Check with your pharmacy or law enforcement to find a location. If you cannot return the medication, check the label or package insert to see if the medication should be thrown out in the garbage or flushed  down the toilet. If you are not sure, ask your care team. If it is safe to put it in the trash, empty the medication out of the container. Mix the medication with cat litter, dirt, coffee grounds, or other unwanted substance. Seal the mixture in a bag or container. Put it in the trash. NOTE: This sheet is a summary. It may not cover all possible information. If you have questions about this medicine, talk to your doctor, pharmacist, or health care provider.  2022 Elsevier/Gold Standard (2021-03-27 00:00:00)

## 2021-08-29 NOTE — Progress Notes (Signed)
CMP is ok, glucose elevated but not fasting.  Vitamin D is low, please start taking Vitamin D3 at 4,000 international units by mouth daily once. B12 is elevated, how are you taking the b12 supplement ?  CBC is within normal limits.

## 2021-08-29 NOTE — Progress Notes (Signed)
[facility])                                            [place] Team    08/29/2021  Tiffany Gregory 06-01-1951 [id number]  FOR 2023 RE ENROLLMENT                                      Medication Assistance Referral  Referral From: [facility] Embedded RPh Catie T.   Medication/Company: [name] / [employer] Patient application portion:  Education officer, museum portion: Magazine features editor to [employer], [place] Provider address/fax verified via: [employer]. [name], [place]  [phone]

## 2021-08-29 NOTE — Progress Notes (Signed)
Acute Office Visit  Subjective:    Patient ID: Tiffany Gregory, female    DOB: 1951-02-24, 70 y.o.   MRN: [id number]  Chief Complaint  Patient presents with   Follow-up    HPI Patient is in today for follow up on Bell's Palsy. She has finished Augmentin for ear infection. She is still on prednisone second dose pack as well as valacyclovir.   Has an eye exam at [facility] eye exam tomorrow for eye exam as recommended.  She is using Systane gel drops in her left eye.  She feels she has had some vision changes since her Bell's palsy in the left eye.  She does wear glasses.  She was triaged at the eye center and scheduled for 08/30/2021.  She does to continue at times symptoms that she has had that chronically, she declines any meclizine it usually does not work she reports rest makes her feel better.  She continues to have fatigue.  MRI was done at last visit which did show suspicious for Bell's palsy, also showed small vestibular schwannoma this was sent to Elayne Guerin her neurosurgeon given her history of neurosurgery in the past.  He reviewed and did not feel that this area was of any concern and patient is to follow-up with Dr. Brigitte Pulse neurologist.  She is still having nerve pain left side of face, she continues prednisone and valacyclovir she will start gabapentin and is in agreement to that today. She sees Dr. Manuella Ghazi December 7th.    IMPRESSION: 1. Subtle asymmetric linear enhancement within the left IAC fundus, suspicious for Bell's palsy. Small vestibular schwannoma is thought less likely given non-masslike appearance and no correlate mass on high-resolution T2 imaging. If the patient's symptoms do not resolve, a follow-up MRI with contrast may be useful to assess stability. 2. Similar 3 mm right and trace left dural thickening and enhancement, probably the chronic sequela of prior subdural hematomas. 3. Mild chronic microvascular ischemic disease.  Electronically Signed    By: Margaretha Sheffield M.D.   On: 08/22/2021 14:17  Patient  denies any fever, body aches,chills, rash, chest pain, shortness of breath, nausea, vomiting, or diarrhea.   Past Medical History:  Diagnosis Date   Allergy    Asthma    Diabetes mellitus without complication (HCC)    Elevated liver enzymes    Hepatic steatosis    History of hepatitis B 1971   She had IV drug use experimentation and was hosptialized and reports treated.    Hyperlipidemia    Hypertension     Past Surgical History:  Procedure Laterality Date   ABDOMINAL HYSTERECTOMY     BREAST BIOPSY     CRANIECTOMY     NASAL FRACTURE SURGERY  1975   NASAL SINUS SURGERY  2010    Family History  Problem Relation Age of Onset   Diabetes Mother    Heart attack Mother    Diabetes Father    Heart disease Father    Hypertension Father    Diabetes Sister    COPD Sister    Heart disease Sister    Diabetes Brother    Heart attack Maternal Grandmother    Cancer Maternal Grandfather    Heart attack Paternal Grandmother    Cancer Paternal Grandfather    Heart attack Brother    Heart disease Brother     Social History   Socioeconomic History   Marital status: Married    Spouse name: Shon Mansouri   Number of children: 2  Years of education: 54   Highest education level: Bachelor's degree (e.g., BA, AB, BS)  Occupational History   Occupation: self employed  Tobacco Use   Smoking status: Never    Passive exposure: Past   Smokeless tobacco: Never  Vaping Use   Vaping Use: Never used  Substance and Sexual Activity   Alcohol use: Not Currently   Drug use: Never   Sexual activity: Not Currently    Partners: Male  Other Topics Concern   Not on file  Social History Narrative   Not on file   Social Determinants of Health   Financial Resource Strain: Medium Risk   Difficulty of Paying Living Expenses: Somewhat hard  Food Insecurity: No Food Insecurity   Worried About Charity fundraiser in the Last  Year: Never true   Ran Out of Food in the Last Year: Never true  Transportation Needs: No Transportation Needs   Lack of Transportation (Medical): No   Lack of Transportation (Non-Medical): No  Physical Activity: Inactive   Days of Exercise per Week: 0 days   Minutes of Exercise per Session: 0 min  Stress: Stress Concern Present   Feeling of Stress : Rather much  Social Connections: Moderately Integrated   Frequency of Communication with Friends and Family: More than three times a week   Frequency of Social Gatherings with Friends and Family: More than three times a week   Attends Religious Services: 1 to 4 times per year   Active Member of [employer] or Organizations: No   Attends Archivist Meetings: Never   Marital Status: Married  Human resources officer Violence: Not At Risk   Fear of Current or Ex-Partner: No   Emotionally Abused: No   Physically Abused: No   Sexually Abused: No    Outpatient Medications Prior to Visit  Medication Sig Dispense Refill   acetaminophen (TYLENOL) 325 MG tablet Take 650 mg by mouth every 6 (six) hours as needed.     amoxicillin-clavulanate (AUGMENTIN) 875-125 MG tablet Take 1 tablet by mouth 2 (two) times daily. 20 tablet 0   aspirin EC 81 MG tablet Take 81 mg by mouth daily. Swallow whole.     atorvastatin (LIPITOR) 20 MG tablet Take 1 tablet (20 mg total) by mouth daily. 90 tablet 1   Blood Glucose Monitoring Suppl (ONE TOUCH ULTRA 2) w/Device KIT by Does not apply route.     cyanocobalamin 1000 MCG tablet Take 1 tablet by mouth daily.     glucose blood test strip 1 each by Other route as needed for other. Use as instructed. One touch ultra blue in vitro strips 100 each 3   meloxicam (MOBIC) 7.5 MG tablet Take 7.5 mg by mouth in the morning and at bedtime.     metFORMIN (GLUCOPHAGE XR) 500 MG 24 hr tablet Take 1 tablet (500 mg total) by mouth daily with breakfast. 90 tablet 1   omeprazole (PRILOSEC) 20 MG capsule Take 2 capsules (40 mg total) by  mouth daily. 180 capsule 0   OneTouch Delica Lancets 01S MISC by Does not apply route.     polyethylene glycol (MIRALAX / GLYCOLAX) 17 g packet Take 17 g by mouth daily.     potassium chloride (KLOR-CON) 10 MEQ tablet Take 1 tablet (10 mEq total) by mouth daily. 20 tablet 0   predniSONE (STERAPRED UNI-PAK 21 TAB) 10 MG (21) TBPK tablet PO: Take 6 tablets on day 1:Take 5 tablets day 2:Take 4 tablets day 3: Take 3 tablets  day 4:Take 2 tablets day five: 5 Take 1 tablet day 6 21 tablet 0   ramipril (ALTACE) 10 MG capsule Take 2 capsules (20 mg total) by mouth daily. 180 capsule 1   Semaglutide, 1 MG/DOSE, (OZEMPIC, 1 MG/DOSE,) 4 MG/3ML SOPN Inject 1 mg into the skin once a week. 3 mL 0   valACYclovir (VALTREX) 1000 MG tablet Take 1 tablet (1,000 mg total) by mouth 2 (two) times daily. 20 tablet 0   vitamin E 180 MG (400 UNITS) capsule Take 400 Units by mouth daily.     Apoaequorin (PREVAGEN PO) Take 1 tablet by mouth daily. (Patient not taking: Reported on 08/29/2021)     cyclobenzaprine (FLEXERIL) 5 MG tablet Take 1 tablet (5 mg total) by mouth at bedtime as needed for muscle spasms (will cause drowsiness.). (Patient not taking: Reported on 08/29/2021) 30 tablet 0   No facility-administered medications prior to visit.    Allergies  Allergen Reactions   Lemon Flavor Anaphylaxis    "my throat closes up"   [name] Other (See Comments)   Sulfa Antibiotics Other (See Comments)    Review of Systems  Constitutional:  Positive for fatigue. Negative for activity change, appetite change, chills, diaphoresis, fever and unexpected weight change.  HENT: Negative.    Respiratory: Negative.    Cardiovascular: Negative.   Genitourinary: Negative.   Musculoskeletal: Negative.   Neurological:  Positive for dizziness, weakness, numbness and headaches. Negative for tremors, seizures, syncope, facial asymmetry, speech difficulty and light-headedness.  Hematological: Negative.    Psychiatric/Behavioral: Negative.        Objective:    Physical Exam Vitals reviewed.  Constitutional:      General: She is not in acute distress.    Appearance: She is not ill-appearing, toxic-appearing or diaphoretic.  HENT:     Head: Normocephalic and atraumatic.     Right Ear: Tympanic membrane, ear canal and external ear normal.     Left Ear: Tympanic membrane and external ear normal.     Nose: Nose normal. No congestion or rhinorrhea.     Mouth/Throat:     Mouth: Mucous membranes are moist.     Pharynx: No oropharyngeal exudate or posterior oropharyngeal erythema.  Eyes:     General:        Right eye: No discharge.        Left eye: Discharge (watery drainage decreased movement with Bells Palsy.) present. Neck:     Trachea: Trachea and phonation normal.      Comments: Neck muscle spasm Cardiovascular:     Rate and Rhythm: Normal rate and regular rhythm.     Pulses: Normal pulses.     Heart sounds: Normal heart sounds.  Pulmonary:     Effort: Pulmonary effort is normal. No respiratory distress.     Breath sounds: Normal breath sounds. No stridor. No wheezing, rhonchi or rales.  Chest:     Chest wall: No tenderness.  Abdominal:     Palpations: Abdomen is soft.  Musculoskeletal:        General: Normal range of motion.     Cervical back: Normal range of motion and neck supple. Muscular tenderness present. No pain with movement.  Lymphadenopathy:     Cervical: No cervical adenopathy.  Skin:    General: Skin is warm.     Findings: No erythema or rash.  Neurological:     Mental Status: She is alert and oriented to person, place, and time.     GCS: GCS eye  subscore is 4. GCS verbal subscore is 5. GCS motor subscore is 6.     Cranial Nerves: Facial asymmetry present.     Sensory: Sensory deficit (left side of face) present.     Motor: Motor function is intact. No weakness.     Coordination: Coordination is intact.     Gait: Gait is intact. Gait normal.     Deep  Tendon Reflexes:     Reflex Scores:      Bicep reflexes are 2+ on the right side and 2+ on the left side.      Achilles reflexes are 2+ on the right side and 2+ on the left side.    Comments: 7th  cranial nerve deficit facial weakness characteristic of Bells Palsy  Strength upper and lower extremities within normal limitts.   Psychiatric:        Mood and Affect: Mood normal.        Behavior: Behavior normal.        Thought Content: Thought content normal.        Judgment: Judgment normal.    BP 138/86   Pulse 83   Temp (!) 95.6 F (35.3 C)   Ht 5' 5"  (1.651 m)   Wt 145 lb 12.8 oz (66.1 kg)   SpO2 99%   BMI 24.26 kg/m  Wt Readings from Last 3 Encounters:  08/29/21 145 lb 12.8 oz (66.1 kg)  08/22/21 146 lb 12.8 oz (66.6 kg)  08/15/21 149 lb 3.2 oz (67.7 kg)    Health Maintenance Due  Topic Date Due   Zoster Vaccines- Shingrix (1 of 2) Never done   Pneumonia Vaccine 57+ Years old (2 - PPSV23 if available, else PCV20) 05/15/2018   COVID-19 Vaccine (4 - Booster for Moderna series) 10/22/2020   INFLUENZA VACCINE  05/08/2021   OPHTHALMOLOGY EXAM  08/24/2021    There are no preventive care reminders to display for this patient.   Lab Results  Component Value Date   TSH 1.920 10/27/2020   Lab Results  Component Value Date   WBC 7.6 08/22/2021   HGB 13.2 08/22/2021   HCT 39.9 08/22/2021   MCV 84.1 08/22/2021   PLT 293.0 08/22/2021   Lab Results  Component Value Date   NA 136 08/22/2021   K 3.3 (L) 08/22/2021   CO2 31 08/22/2021   GLUCOSE 202 (H) 08/22/2021   BUN 14 08/22/2021   CREATININE 0.74 08/22/2021   BILITOT 0.5 08/22/2021   ALKPHOS 56 08/22/2021   AST 14 08/22/2021   ALT 16 08/22/2021   PROT 7.0 08/22/2021   ALBUMIN 4.2 08/22/2021   CALCIUM 9.1 08/22/2021   ANIONGAP 9 06/20/2020   EGFR 89 01/24/2021   GFR 82.43 08/22/2021   Lab Results  Component Value Date   CHOL 182 08/15/2021   Lab Results  Component Value Date   HDL 37.90 (L) 08/15/2021    Lab Results  Component Value Date   LDLCALC 74 10/27/2020   Lab Results  Component Value Date   TRIG 229.0 (H) 08/15/2021   Lab Results  Component Value Date   CHOLHDL 5 08/15/2021   Lab Results  Component Value Date   HGBA1C 6.4 08/15/2021       Assessment & Plan:   Problem List Items Addressed This Visit       Nervous and Auditory   Bell's palsy - Primary   Relevant Medications   gabapentin (NEURONTIN) 100 MG capsule   cyclobenzaprine (FLEXERIL) 10 MG tablet  Other Relevant Orders   CBC with Differential/Platelet     Other   History of craniotomy   Spasm of cervical paraspinous muscle   Relevant Medications   cyclobenzaprine (FLEXERIL) 10 MG tablet   Fatigue due to exposure   Hypokalemia   Relevant Orders   Comprehensive metabolic panel   VITAMIN D 25 Hydroxy (Vit-D Deficiency, Fractures)   B12 and Folate Panel   Nerve pain   Relevant Medications   gabapentin (NEURONTIN) 100 MG capsule  Recommend she continue the prednisone Dosepak complete that as well as the valacyclovir.  She is having some spasms in the left side of her neck due to positioning not being able to get comfortable with the pain of her left face.  We will go ahead and start Neurontin low-dose 100 mg 3 times a day she is following up with Dr. Manuella Ghazi  on December 7.  She will call me if any symptoms change or worsen.  Discussed the side effects of Neurontin  She has a complex medical history with history of hepatitis B in the past, craniotomy in the past, she has been seen by Dr. Elayne Guerin and her recent MRI was also reviewed by him.  She has follow-up with Dr. Brigitte Pulse who has also been forwarded her MRI and moved her follow-up to December 7 instead of February.  She continues to have chronic vertigo type symptoms declines any Antivert as it does not help she seems to feel better when she just lays down.  She continues to have chronic fatigue, will check CBC CMP and vitamin D B12.  Potassium  was slightly low at last check potassium 10 mEq was added daily we will recheck that and then readdress this after labs.  She is also trying to add in some potassium rich foods to her diet so we may be able to discontinue the potassium supplement at the next visit.  Red Flags discussed. The patient was given clear instructions to go to ER or return to medical center if any red flags develop, symptoms do not improve, worsen or new problems develop. They verbalized understanding.   Return if symptoms worsen or fail to improve, for at any time for any worsening symptoms, Go to Emergency room/ urgent care if worse.    Meds ordered this encounter  Medications   gabapentin (NEURONTIN) 100 MG capsule    Sig: Take 1 capsule (100 mg total) by mouth 3 (three) times daily.    Dispense:  90 capsule    Refill:  0   cyclobenzaprine (FLEXERIL) 10 MG tablet    Sig: Take 1 tablet (10 mg total) by mouth at bedtime.    Dispense:  30 tablet    Refill:  0   Discussed side effects of cyclobenzaprine and trying half a tablet.  She is also advised that if she is not driving she could take a tablet twice daily to see if it relieves the pain in her neck she has been trying heat on her neck recommend that she try cool compresses.  She denies any other associated radiating symptoms or any chest pain.  She feels that it is positional pain due to not being able to get comfortable to rest at bedtime due to Bell's palsy, this all started after Bell's palsy diagnosis.  Red flags were discussed with patient.  Return if symptoms worsen or fail to improve, for at any time for any worsening symptoms, Go to Emergency room/ urgent care if worse.  Tiffany Gregory  Tiffany Grumbine, FNP

## 2021-08-30 DIAGNOSIS — G510 Bell's palsy: Secondary | ICD-10-CM | POA: Diagnosis not present

## 2021-08-30 LAB — HM DIABETES EYE EXAM

## 2021-09-01 ENCOUNTER — Telehealth: Payer: Medicare Other

## 2021-09-05 ENCOUNTER — Ambulatory Visit: Payer: Medicare Other | Admitting: *Deleted

## 2021-09-05 DIAGNOSIS — E876 Hypokalemia: Secondary | ICD-10-CM

## 2021-09-05 DIAGNOSIS — I10 Essential (primary) hypertension: Secondary | ICD-10-CM

## 2021-09-05 DIAGNOSIS — E1169 Type 2 diabetes mellitus with other specified complication: Secondary | ICD-10-CM

## 2021-09-05 DIAGNOSIS — T732XXS Exhaustion due to exposure, sequela: Secondary | ICD-10-CM

## 2021-09-05 DIAGNOSIS — M792 Neuralgia and neuritis, unspecified: Secondary | ICD-10-CM

## 2021-09-05 DIAGNOSIS — M542 Cervicalgia: Secondary | ICD-10-CM

## 2021-09-05 DIAGNOSIS — G510 Bell's palsy: Secondary | ICD-10-CM

## 2021-09-05 DIAGNOSIS — E119 Type 2 diabetes mellitus without complications: Secondary | ICD-10-CM

## 2021-09-05 DIAGNOSIS — R413 Other amnesia: Secondary | ICD-10-CM

## 2021-09-05 DIAGNOSIS — Z9889 Other specified postprocedural states: Secondary | ICD-10-CM

## 2021-09-05 DIAGNOSIS — W19XXXD Unspecified fall, subsequent encounter: Secondary | ICD-10-CM

## 2021-09-06 DIAGNOSIS — E119 Type 2 diabetes mellitus without complications: Secondary | ICD-10-CM

## 2021-09-06 DIAGNOSIS — E1169 Type 2 diabetes mellitus with other specified complication: Secondary | ICD-10-CM | POA: Diagnosis not present

## 2021-09-06 DIAGNOSIS — E785 Hyperlipidemia, unspecified: Secondary | ICD-10-CM | POA: Diagnosis not present

## 2021-09-06 DIAGNOSIS — I10 Essential (primary) hypertension: Secondary | ICD-10-CM

## 2021-09-06 NOTE — Chronic Care Management (AMB) (Signed)
Chronic Care Management    Clinical Social Work Note  09/06/2021 Name: Tiffany Gregory MRN: [id number] DOB: Apr 28, 1951  Tiffany Gregory is a 70 y.o. year old female who is a primary care patient of [name], Tiffany Aline, Gregory. The CCM team was consulted to assist the patient with chronic disease management and/or care coordination needs related to: [employer], Mental Health Counseling and Resources, and Caregiver Stress.   Engaged with patient by telephone for follow up visit in response to provider referral for social work chronic care management and care coordination services.   Consent to Services:  The patient was given information about Chronic Care Management services, agreed to services, and gave verbal consent prior to initiation of services.  Please see initial visit note for detailed documentation.   Patient agreed to services and consent obtained.   Assessment: Review of patient past medical history, allergies, medications, and health status, including review of relevant consultants reports was performed today as part of a comprehensive evaluation and provision of chronic care management and care coordination services.     SDOH (Social Determinants of Health) assessments and interventions performed:    Advanced Directives Status: Not addressed in this encounter.  CCM Care Plan  Allergies  Allergen Reactions   Lemon Flavor Anaphylaxis    "my throat closes up"   [name] Other (See Comments)   Sulfa Antibiotics Other (See Comments)    Other reaction(s): Unknown    Outpatient Encounter Medications as of 09/05/2021  Medication Sig Note   acetaminophen (TYLENOL) 325 MG tablet Take 650 mg by mouth every 6 (six) hours as needed.    amoxicillin-clavulanate (AUGMENTIN) 875-125 MG tablet Take 1 tablet by mouth 2 (two) times daily.    Apoaequorin (PREVAGEN PO) Take 1 tablet by mouth daily. (Patient not taking: Reported on 08/29/2021)    aspirin EC 81 MG tablet  Take 81 mg by mouth daily. Swallow whole.    atorvastatin (LIPITOR) 20 MG tablet Take 1 tablet (20 mg total) by mouth daily.    Blood Glucose Monitoring Suppl (ONE TOUCH ULTRA 2) w/Device KIT by Does not apply route.    cyanocobalamin 1000 MCG tablet Take 1 tablet by mouth daily.    cyclobenzaprine (FLEXERIL) 10 MG tablet Take 1 tablet (10 mg total) by mouth at bedtime.    gabapentin (NEURONTIN) 100 MG capsule Take 1 capsule (100 mg total) by mouth 3 (three) times daily.    glucose blood test strip 1 each by Other route as needed for other. Use as instructed. One touch ultra blue in vitro strips    meloxicam (MOBIC) 7.5 MG tablet Take 7.5 mg by mouth in the morning and at bedtime. 04/07/2021: Taking once daily   metFORMIN (GLUCOPHAGE XR) 500 MG 24 hr tablet Take 1 tablet (500 mg total) by mouth daily with breakfast.    omeprazole (PRILOSEC) 20 MG capsule Take 2 capsules (40 mg total) by mouth daily.    OneTouch Delica Lancets 55D MISC by Does not apply route.    polyethylene glycol (MIRALAX / GLYCOLAX) 17 g packet Take 17 g by mouth daily.    potassium chloride (KLOR-CON) 10 MEQ tablet Take 1 tablet (10 mEq total) by mouth daily.    predniSONE (STERAPRED UNI-PAK 21 TAB) 10 MG (21) TBPK tablet PO: Take 6 tablets on day 1:Take 5 tablets day 2:Take 4 tablets day 3: Take 3 tablets day 4:Take 2 tablets day five: 5 Take 1 tablet day 6    ramipril (ALTACE) 10 MG capsule  Take 2 capsules (20 mg total) by mouth daily.    Semaglutide, 1 MG/DOSE, (OZEMPIC, 1 MG/DOSE,) 4 MG/3ML SOPN Inject 1 mg into the skin once a week.    valACYclovir (VALTREX) 1000 MG tablet Take 1 tablet (1,000 mg total) by mouth 2 (two) times daily.    vitamin E 180 MG (400 UNITS) capsule Take 400 Units by mouth daily.    No facility-administered encounter medications on file as of 09/05/2021.    Patient Active Problem List   Diagnosis Date Noted   Fatigue due to exposure 08/29/2021   Hypokalemia 08/29/2021   Nerve pain 08/29/2021    History of leukocytosis 08/22/2021   Non-recurrent acute serous otitis media of left ear 08/22/2021   Balance problem 08/22/2021   Bell's palsy 08/12/2021   Nasal congestion 02/14/2021   Spasm of cervical paraspinous muscle 01/24/2021   B12 deficiency 01/24/2021   Neck pain 01/24/2021   Fall 01/24/2021   Gastroesophageal reflux disease 11/29/2020   Memory loss 10/27/2020   History of craniotomy 10/27/2020   Family history of GERD 10/27/2020   Hyperlipidemia 06/22/2020   Elevated liver enzymes 06/22/2020   History of hepatitis B virus infection 06/22/2020   Overweight with body mass index (BMI) of 27 to 27.9 in adult 06/22/2020   Hepatic steatosis 06/15/2020   Encounter for medical examination to establish care 06/14/2020   Type 2 diabetes mellitus without complication, without long-term current use of insulin ([place]) 06/14/2020   Essential hypertension 06/14/2020   Skin lesion 06/14/2020    Conditions to be addressed/monitored: Anxiety and Caregiver Stress.  Limited Social Support, Mental Health Concerns, Social Isolation, Limited Access to Caregiver, and Lacks Knowledge of [employer].  Care Plan : LCSW Plan of Care  Updates made by Francis Gaines, LCSW since 09/06/2021 12:00 AM     Problem: Find Caregiver Support and Counseling Services in My Community.   Priority: High     Goal: Find Multimedia programmer and Counseling Services in My Community.   Start Date: 06/08/2021  Expected End Date: 10/06/2021  This Visit's Progress: On track  Recent Progress: On track  Priority: High  Note:   Current Barriers:   Acute Mental Health needs related to Anxiety and Caregiver Stress, requires Support, Education, Resources, Referrals and Care Coordination in order to meet unmet mental health needs. Clinical Goal(s):  Patient will work with LCSW to reduce and manage symptoms of Anxiety and Caregiver Stress.     Clinical Interventions:  Active Listening utilized, Emotional Support  provided, Cognitive Behavioral Therapy performed, Caregiver Stress acknowledged, Problem-Solving Solutions identified, and Verbalization of Feelings encouraged.   Collaboration with Primary Care Physician, Dr. [name] regarding development and update of comprehensive plan of care as evidenced by provider attestation and co-signature. Patient Goals/Self-Care Activities: Continue to receive personal counseling with LCSW, on a bi-weekly basis, to reduce and manage symptoms of Anxiety and Caregiver Stress, until manageable.     Continue to use eye drops as instructed, and keep follow-up appointment with Dr. Benay Pillow, Ophthalmologist at [facility] [phone]), scheduled in one month.    Limit the amount of hours you work per day to 4, until vision is restored and strength and stamina are back to baseline.   Continue to rotate cold and hot compresses to left side of face, where nerve damage is most prominent, and hopefully you will begin to see partial paralysis begin to slowing resolve.   Continue to take Gabapentin, 1 capsule (100 mg total) by  mouth 3 (three) times daily, for neuropathic pain in neck. Develop a list of questions that you would like addressed by Dr. Ray Church, Neurologist at [facility], during your follow-up appointment, scheduled on 09/13/2021 at 9:15 am.   # 1.  Do you need to undergo another MRI to rule out possible Stroke? # 2.  Review results of MRI of Head, ordered by Primary Care Physician, Dr. [name] on 08/22/2021.  # 3.  Upon your independent review, a diagnosis of Mild Chronic Microvascular Ischemic Disease was noted.  What does that mean? # 4.  Is Mild Chronic Microvascular Ischemic Disease a progressive disease?  If so, is it typically slow grower or rapid progression? # 5.  What is my prognosis with Mild Chronic Microvascular Ischemic Disease?  Do I need to get my affairs in order? # 6.  Is Mild Chronic  Microvascular Ischemic Disease age-related, and appropriate for my age? # 7.  Is there something I can do to stop or slow the progression of Mild Chronic Microvascular Ischemic Disease? Provided is a list of questions developed during our collaboration with one-another, e-mailed to you on 09/05/2021.  Please feel free to add to this list and e-mail back to LCSW ([email or web address]).  LCSW will then submit directly to Dr. Manuella Ghazi for review, prior to your scheduled appointment. Contact LCSW directly (# [id number]) if you have questions, need assistance, or if additional social work needs are identified between now and our next scheduled telephone outreach call.   Follow-Up:  09/19/2021 at 12:30 pm     Nat Christen LCSW Licensed Clinical Social Worker [place]  [phone]

## 2021-09-06 NOTE — Patient Instructions (Signed)
Visit Information  Thank you for taking time to visit with me today. Please don't hesitate to contact me if I can be of assistance to you before our next scheduled telephone appointment.  Following are the goals we discussed today:   Patient Goals/Self-Care Activities: Continue to receive personal counseling with LCSW, on a bi-weekly basis, to reduce and manage symptoms of Anxiety and Caregiver Stress, until manageable.     Continue to use eye drops as instructed, and keep follow-up appointment with Dr. Benay Pillow, Ophthalmologist at [facility] [phone]), scheduled in one month.    Limit the amount of hours you work per day to 4, until vision is restored and strength and stamina are back to baseline.   Continue to rotate cold and hot compresses to left side of face, where nerve damage is most prominent, and hopefully you will begin to see partial paralysis begin to slowing resolve.   Continue to take Gabapentin, 1 capsule (100 mg total) by mouth 3 (three) times daily, for neuropathic pain in neck. Develop a list of questions that you would like addressed by Dr. Ray Church, Neurologist at [facility], during your follow-up appointment, scheduled on 09/13/2021 at 9:15 am.   # 1.  Do you need to undergo another MRI to rule out possible Stroke? # 2.  Review results of MRI of Head, ordered by Primary Care Physician, Dr. [name] on 08/22/2021.  # 3.  Upon your independent review, a diagnosis of Mild Chronic Microvascular Ischemic Disease was noted.  What does that mean? # 4.  Is Mild Chronic Microvascular Ischemic Disease a progressive disease?  If so, is it typically slow grower or rapid progression? # 5.  What is my prognosis with Mild Chronic Microvascular Ischemic Disease?  Do I need to get my affairs in order? # 6.  Is Mild Chronic Microvascular Ischemic Disease age-related, and appropriate for my age? # 7.  Is there something I can do to  stop or slow the progression of Mild Chronic Microvascular Ischemic Disease? Provided is a list of questions developed during our collaboration with one-another, e-mailed to you on 09/05/2021.  Please feel free to add to this list and e-mail back to LCSW ([email or web address]).  LCSW will then submit directly to Dr. Manuella Ghazi for review, prior to your scheduled appointment. Contact LCSW directly (# [id number]) if you have questions, need assistance, or if additional social work needs are identified between now and our next scheduled telephone outreach call.    Our next appointment is by telephone on 09/19/2021 at 12:30 pm.    Please call the care guide team at [phone] if you need to cancel or reschedule your appointment.   If you are experiencing a Mental Health or [place] or need someone to talk to, please call the Suicide and Crisis Lifeline: 988 call the [place] National Suicide Prevention Lifeline: (870)507-3093 or TTY: 830-132-2204 TTY 276-008-7872) to talk to a trained counselor call 1-800-273-TALK (toll free, 24 hour hotline) go to [facility] [address], [place] [phone]) call 911   Patient verbalizes understanding of instructions provided today and agrees to view in [place].   Nat Christen LCSW Licensed Clinical Social Worker [place]  [phone]

## 2021-09-11 ENCOUNTER — Encounter: Payer: Self-pay | Admitting: Adult Health

## 2021-09-11 DIAGNOSIS — R399 Unspecified symptoms and signs involving the genitourinary system: Secondary | ICD-10-CM

## 2021-09-11 NOTE — Telephone Encounter (Signed)
Patient called back in and getting lab appointment scheduled with the front desk.  Orders placed

## 2021-09-12 ENCOUNTER — Other Ambulatory Visit: Payer: Medicare Other

## 2021-09-12 ENCOUNTER — Other Ambulatory Visit: Payer: Self-pay

## 2021-09-12 DIAGNOSIS — R399 Unspecified symptoms and signs involving the genitourinary system: Secondary | ICD-10-CM

## 2021-09-12 NOTE — Addendum Note (Signed)
Addended by: Leeanne Rio on: 09/12/2021 02:35 PM   Modules accepted: Orders

## 2021-09-13 ENCOUNTER — Telehealth: Payer: Self-pay

## 2021-09-13 ENCOUNTER — Other Ambulatory Visit: Payer: Self-pay | Admitting: Adult Health

## 2021-09-13 DIAGNOSIS — N390 Urinary tract infection, site not specified: Secondary | ICD-10-CM

## 2021-09-13 DIAGNOSIS — R319 Hematuria, unspecified: Secondary | ICD-10-CM

## 2021-09-13 DIAGNOSIS — M5481 Occipital neuralgia: Secondary | ICD-10-CM | POA: Diagnosis not present

## 2021-09-13 DIAGNOSIS — S065XAA Traumatic subdural hemorrhage with loss of consciousness status unknown, initial encounter: Secondary | ICD-10-CM | POA: Diagnosis not present

## 2021-09-13 DIAGNOSIS — G939 Disorder of brain, unspecified: Secondary | ICD-10-CM | POA: Diagnosis not present

## 2021-09-13 DIAGNOSIS — G510 Bell's palsy: Secondary | ICD-10-CM | POA: Diagnosis not present

## 2021-09-13 LAB — MICROSCOPIC EXAMINATION
Casts: NONE SEEN /lpf
WBC, UA: >30 /hpf — AB (ref 0–5)

## 2021-09-13 LAB — URINALYSIS, ROUTINE W REFLEX MICROSCOPIC
Bilirubin, UA: NEGATIVE
Glucose, UA: NEGATIVE
Ketones, UA: NEGATIVE
Nitrite, UA: NEGATIVE
Specific Gravity, UA: 1.013 (ref 1.005–1.030)
Urobilinogen, Ur: 1 mg/dL (ref 0.2–1.0)
pH, UA: 6 (ref 5.0–7.5)

## 2021-09-13 MED ORDER — CEPHALEXIN 500 MG PO CAPS: 500.0000 mg | ORAL_CAPSULE | Freq: Four times a day (QID) | ORAL | 0 refills | Status: AC

## 2021-09-13 NOTE — Addendum Note (Signed)
Addended by: Elpidio Galea T on: 09/13/2021 11:23 AM   Modules accepted: Orders

## 2021-09-13 NOTE — Progress Notes (Signed)
Given bacteria in urine, blood and cloudy appearance will go ahead and send antibiotic - Keflex as below. Wait on culture to see if medication change needed. After completion of antibiotic recheck urinalysis 2 weeks later to rule out blood has resolved with infection, please schedule patient.   Meds ordered this encounter Medications  cephALEXin (KEFLEX) 500 MG capsule   Sig: Take 1 capsule (500 mg total) by mouth 4 (four) times daily for 7 days.   Dispense:  28 capsule   Refill:  0 Advised patient call the office or your primary care doctor for an appointment if no improvement within 72 hours or if any symptoms change or worsen at any time  Advised ER or urgent Care if after hours or on weekend. Call 911 for emergency symptoms at any time.Patinet verbalized understanding of all instructions given/reviewed and treatment plan and has no further questions or concerns at this time.

## 2021-09-13 NOTE — Progress Notes (Signed)
Meds ordered this encounter  Medications   cephALEXin (KEFLEX) 500 MG capsule    Sig: Take 1 capsule (500 mg total) by mouth 4 (four) times daily for 7 days.    Dispense:  28 capsule    Refill:  0    Urinary tract infection with hematuria, site unspecified - Plan: cephALEXin (KEFLEX) 500 MG capsule

## 2021-09-13 NOTE — Telephone Encounter (Signed)
Pt called regarding results.

## 2021-09-13 NOTE — Telephone Encounter (Signed)
-----   Message from [name], [place] sent at 09/13/2021  7:55 AM EST ----- Given bacteria in urine, blood and cloudy appearance will go ahead and send antibiotic - Keflex as below. Wait on culture to see if medication change needed. After completion of antibiotic recheck urinalysis 2 weeks later to rule out blood has resolved with infection, please schedule patient.   Meds ordered this encounter Medications  cephALEXin (KEFLEX) 500 MG capsule   Sig: Take 1 capsule (500 mg total) by mouth 4 (four) times daily for 7 days.   Dispense:  28 capsule   Refill:  0 Advised patient call the office or your primary care doctor for an appointment if no improvement within 72 hours or if any symptoms change or worsen at any time  Advised ER or urgent Care if after hours or on weekend. Call 911 for emergency symptoms at any time.Patinet verbalized understanding of all instructions given/reviewed and treatment plan and has no further questions or concerns at this time.

## 2021-09-13 NOTE — Telephone Encounter (Signed)
Patient returned office phone call. 

## 2021-09-17 NOTE — Progress Notes (Signed)
Keflex should cover the e coli in urine. How are you doing with symptoms  ?

## 2021-09-19 ENCOUNTER — Other Ambulatory Visit: Payer: Self-pay

## 2021-09-19 ENCOUNTER — Ambulatory Visit: Payer: Medicare Other | Admitting: *Deleted

## 2021-09-19 DIAGNOSIS — M62838 Other muscle spasm: Secondary | ICD-10-CM

## 2021-09-19 DIAGNOSIS — T732XXS Exhaustion due to exposure, sequela: Secondary | ICD-10-CM

## 2021-09-19 DIAGNOSIS — G510 Bell's palsy: Secondary | ICD-10-CM

## 2021-09-19 DIAGNOSIS — E876 Hypokalemia: Secondary | ICD-10-CM

## 2021-09-19 DIAGNOSIS — I10 Essential (primary) hypertension: Secondary | ICD-10-CM

## 2021-09-19 DIAGNOSIS — R413 Other amnesia: Secondary | ICD-10-CM

## 2021-09-19 DIAGNOSIS — R2689 Other abnormalities of gait and mobility: Secondary | ICD-10-CM

## 2021-09-19 DIAGNOSIS — K219 Gastro-esophageal reflux disease without esophagitis: Secondary | ICD-10-CM

## 2021-09-19 DIAGNOSIS — E785 Hyperlipidemia, unspecified: Secondary | ICD-10-CM

## 2021-09-19 DIAGNOSIS — E1169 Type 2 diabetes mellitus with other specified complication: Secondary | ICD-10-CM

## 2021-09-19 LAB — URINE CULTURE

## 2021-09-19 MED ORDER — OMEPRAZOLE 20 MG PO CPDR: 40.0000 mg | DELAYED_RELEASE_CAPSULE | Freq: Every day | ORAL | 0 refills | Status: DC

## 2021-09-19 NOTE — Chronic Care Management (AMB) (Signed)
Chronic Care Management    Clinical Social Work Note  09/19/2021 Name: Tiffany Gregory MRN: [id number] DOB: [birth year]  Tiffany Gregory is a 70 y.o. year old female who is a primary care patient of [name], [name], FNP. The CCM team was consulted to assist the patient with chronic disease management and/or care coordination needs related to: [employer], Mental Health Counseling and Resources, and Caregiver Stress.   Engaged with patient by telephone for follow up visit in response to provider referral for social work chronic care management and care coordination services.   Consent to Services:  The patient was given information about Chronic Care Management services, agreed to services, and gave verbal consent prior to initiation of services.  Please see initial visit note for detailed documentation.   Patient agreed to services and consent obtained.   Assessment: Review of patient past medical history, allergies, medications, and health status, including review of relevant consultants reports was performed today as part of a comprehensive evaluation and provision of chronic care management and care coordination services.     SDOH (Social Determinants of Health) assessments and interventions performed:    Advanced Directives Status: See Care Plan for related entries.  CCM Care Plan  Allergies  Allergen Reactions   Lemon Flavor Anaphylaxis    "my throat closes up"   [name] Other (See Comments)   Sulfa Antibiotics Other (See Comments)    Other reaction(s): Unknown    Outpatient Encounter Medications as of 09/19/2021  Medication Sig Note   acetaminophen (TYLENOL) 325 MG tablet Take 650 mg by mouth every 6 (six) hours as needed.    Apoaequorin (PREVAGEN PO) Take 1 tablet by mouth daily. (Patient not taking: Reported on 08/29/2021)    aspirin EC 81 MG tablet Take 81 mg by mouth daily. Swallow whole.    atorvastatin (LIPITOR) 20 MG tablet Take 1 tablet (20 mg  total) by mouth daily.    Blood Glucose Monitoring Suppl (ONE TOUCH ULTRA 2) w/Device KIT by Does not apply route.    cephALEXin (KEFLEX) 500 MG capsule Take 1 capsule (500 mg total) by mouth 4 (four) times daily for 7 days.    cyanocobalamin 1000 MCG tablet Take 1 tablet by mouth daily.    cyclobenzaprine (FLEXERIL) 10 MG tablet Take 1 tablet (10 mg total) by mouth at bedtime.    gabapentin (NEURONTIN) 100 MG capsule Take 1 capsule (100 mg total) by mouth 3 (three) times daily.    glucose blood test strip 1 each by Other route as needed for other. Use as instructed. One touch ultra blue in vitro strips    meloxicam (MOBIC) 7.5 MG tablet Take 7.5 mg by mouth in the morning and at bedtime. 04/07/2021: Taking once daily   metFORMIN (GLUCOPHAGE XR) 500 MG 24 hr tablet Take 1 tablet (500 mg total) by mouth daily with breakfast.    omeprazole (PRILOSEC) 20 MG capsule Take 2 capsules (40 mg total) by mouth daily.    OneTouch Delica Lancets 10O MISC by Does not apply route.    polyethylene glycol (MIRALAX / GLYCOLAX) 17 g packet Take 17 g by mouth daily.    potassium chloride (KLOR-CON) 10 MEQ tablet Take 1 tablet (10 mEq total) by mouth daily.    predniSONE (STERAPRED UNI-PAK 21 TAB) 10 MG (21) TBPK tablet PO: Take 6 tablets on day 1:Take 5 tablets day 2:Take 4 tablets day 3: Take 3 tablets day 4:Take 2 tablets day five: 5 Take 1 tablet day 6  ramipril (ALTACE) 10 MG capsule Take 2 capsules (20 mg total) by mouth daily.    Semaglutide, 1 MG/DOSE, (OZEMPIC, 1 MG/DOSE,) 4 MG/3ML SOPN Inject 1 mg into the skin once a week.    valACYclovir (VALTREX) 1000 MG tablet Take 1 tablet (1,000 mg total) by mouth 2 (two) times daily.    vitamin E 180 MG (400 UNITS) capsule Take 400 Units by mouth daily.    No facility-administered encounter medications on file as of 09/19/2021.    Patient Active Problem List   Diagnosis Date Noted   Fatigue due to exposure 08/29/2021   Hypokalemia 08/29/2021   Nerve pain  08/29/2021   History of leukocytosis 08/22/2021   Non-recurrent acute serous otitis media of left ear 08/22/2021   Balance problem 08/22/2021   Bell's palsy 08/12/2021   Nasal congestion 02/14/2021   Spasm of cervical paraspinous muscle 01/24/2021   B12 deficiency 01/24/2021   Neck pain 01/24/2021   Fall 01/24/2021   Gastroesophageal reflux disease 11/29/2020   Memory loss 10/27/2020   History of craniotomy 10/27/2020   Family history of GERD 10/27/2020   Hyperlipidemia 06/22/2020   Elevated liver enzymes 06/22/2020   History of hepatitis B virus infection 06/22/2020   Overweight with body mass index (BMI) of 27 to 27.9 in adult 06/22/2020   Hepatic steatosis 06/15/2020   Encounter for medical examination to establish care 06/14/2020   Type 2 diabetes mellitus without complication, without long-term current use of insulin ([place]) 06/14/2020   Essential hypertension 06/14/2020   Skin lesion 06/14/2020    Conditions to be addressed/monitored: Anxiety and Caregiver Stress.  Limited Social Support, Mental Health Concerns, Social Isolation, Limited Access to Caregiver, Memory Deficits, and Lacks Knowledge of [employer].  Care Plan : LCSW Plan of Care  Updates made by Francis Gaines, LCSW since 09/19/2021 12:00 AM     Problem: Find Caregiver Support and Counseling Services in My Community.   Priority: High     Long-Range Goal: Find Acupuncturist in My Community.   Start Date: 06/08/2021  Expected End Date: 11/08/2021  This Visit's Progress: On track  Recent Progress: On track  Priority: High  Note:   Current Barriers:   Acute Mental Health needs related to Anxiety and Caregiver Stress, requires Support, Education, Resources, Referrals and Care Coordination in order to meet unmet mental health needs. Clinical Goal(s):  Patient will work with LCSW to reduce and manage symptoms of Anxiety and Caregiver Stress.     Clinical Interventions:   Active Listening utilized, Emotional Support provided, Cognitive Behavioral Therapy performed, Caregiver Stress acknowledged, Problem-Solving Solutions identified, and Verbalization of Feelings encouraged.   Collaboration with Primary Care Physician, Dr. [name] regarding development and update of comprehensive plan of care as evidenced by provider attestation and co-signature. Patient Goals/Self-Care Activities: Continue to receive personal counseling with LCSW, on a bi-weekly basis, to reduce and manage symptoms of Anxiety and Caregiver Stress, until manageable.     Continue to limit the amount of hours you work per day to 4, and consistently wear eye-patch, until vision is completely restored.   List of questions, with answers, addressed with Dr. Ray Church, Neurologist at [facility], during your follow-up visit on 09/13/2021 at 9:15 am.   # 1.  Do you need to undergo another MRI to rule out possible Stroke?   No, recent MRI results did not show signs of Stroke. # 2.  Review results of MRI of Head, ordered  by Primary Care Physician, Dr. [name] on 08/22/2021. Only shows a few areas of Mild Chronic Microvascular Ischemic Disease.  # 3.  Upon your independent review, a diagnosis of Mild Chronic Microvascular Ischemic Disease was noted.  What does that mean?   Common in individuals diagnosed with Hypertension and High Blood Pressure. # 4.  Is Mild Chronic Microvascular Ischemic Disease a progressive disease?  If so, is it typically slow grower or rapid progression? Yes, disease is progressive, but slow progression. # 5.  What is my prognosis with Mild Chronic Microvascular Ischemic Disease?  Do I need to get my affairs in order? Memory and cognition could be unaffected for another 10-12 years.  Need to have affairs (Living Will and [place]) documents in place, regardless of prognosis. # 6.  Is Mild Chronic Microvascular  Ischemic Disease age-related, and appropriate for my age?   Age-related, and appropriate for age. # 7.  Is there something I can do to stop or slow the progression of Mild Chronic Microvascular Ischemic Disease? Keep blood pressure and blood sugars under control. Contact LCSW directly (# [id number]) if you have questions, need assistance, or if additional social work needs are identified between now and our next scheduled telephone outreach call.   Follow-Up:  10/18/2021 at 3:15 pm     Nat Christen LCSW Licensed Clinical Social Worker [place]  [phone]

## 2021-09-19 NOTE — Patient Instructions (Signed)
Visit Information  Thank you for taking time to visit with me today. Please don't hesitate to contact me if I can be of assistance to you before our next scheduled telephone appointment.  Following are the goals we discussed today:   Patient Goals/Self-Care Activities: Continue to receive personal counseling with LCSW, on a bi-weekly basis, to reduce and manage symptoms of Anxiety and Caregiver Stress, until manageable.     Continue to limit the amount of hours you work per day to 4, and consistently wear eye-patch, until vision is completely restored.   List of questions, with answers, addressed with Dr. Ray Church, Neurologist at [facility], during your follow-up visit on 09/13/2021 at 9:15 am.   # 1.  Do you need to undergo another MRI to rule out possible Stroke?   No, recent MRI results did not show signs of Stroke. # 2.  Review results of MRI of Head, ordered by Primary Care Physician, Dr. [name] on 08/22/2021. Only shows a few areas of Mild Chronic Microvascular Ischemic Disease.  # 3.  Upon your independent review, a diagnosis of Mild Chronic Microvascular Ischemic Disease was noted.  What does that mean?   Common in individuals diagnosed with Hypertension and High Blood Pressure. # 4.  Is Mild Chronic Microvascular Ischemic Disease a progressive disease?  If so, is it typically slow grower or rapid progression? Yes, disease is progressive, but slow progression. # 5.  What is my prognosis with Mild Chronic Microvascular Ischemic Disease?  Do I need to get my affairs in order? Memory and cognition could be unaffected for another 10-12 years.  Need to have affairs (Living Will and [place]) documents in place, regardless of prognosis. # 6.  Is Mild Chronic Microvascular Ischemic Disease age-related, and appropriate for my age?   Age-related, and appropriate for age. # 7.  Is there something I can do to stop or slow the  progression of Mild Chronic Microvascular Ischemic Disease? Keep blood pressure and blood sugars under control. Contact LCSW directly (# [id number]) if you have questions, need assistance, or if additional social work needs are identified between now and our next scheduled telephone outreach call.    Our next appointment is by telephone on 10/18/2021 at 3:15 pm.  Please call the care guide team at [phone] if you need to cancel or reschedule your appointment.   If you are experiencing a Mental Health or [place] or need someone to talk to, please call the Suicide and Crisis Lifeline: 988 call the [place] National Suicide Prevention Lifeline: (669) 121-2896 or TTY: 682 840 1443 TTY (832)760-8198) to talk to a trained counselor call 1-800-273-TALK (toll free, 24 hour hotline) go to [facility] [address], [place] [phone]) call the [place]: [phone] call 911   Patient verbalizes understanding of instructions provided today and agrees to view in [place].   Nat Christen LCSW Licensed Clinical Social Worker [place]  [phone]

## 2021-09-20 NOTE — Progress Notes (Signed)
See My chart note  09/19/21 please call patient ask her how she is doing. Discussed keflex in mychart message and advised please follow up and triage patient see how she is doing and if she needs a follow up.

## 2021-09-20 NOTE — Progress Notes (Signed)
Noted  

## 2021-09-25 ENCOUNTER — Ambulatory Visit (INDEPENDENT_AMBULATORY_CARE_PROVIDER_SITE_OTHER): Payer: Medicare Other | Admitting: Adult Health

## 2021-09-25 ENCOUNTER — Other Ambulatory Visit: Payer: Self-pay

## 2021-09-25 ENCOUNTER — Encounter: Payer: Self-pay | Admitting: Adult Health

## 2021-09-25 VITALS — BP 138/86 | HR 86 | Temp 95.3°F | Ht 65.0 in | Wt 146.8 lb

## 2021-09-25 DIAGNOSIS — R21 Rash and other nonspecific skin eruption: Secondary | ICD-10-CM | POA: Diagnosis not present

## 2021-09-25 DIAGNOSIS — N3000 Acute cystitis without hematuria: Secondary | ICD-10-CM | POA: Diagnosis not present

## 2021-09-25 LAB — URINALYSIS, ROUTINE W REFLEX MICROSCOPIC
Bilirubin Urine: NEGATIVE
Hgb urine dipstick: NEGATIVE
Ketones, ur: NEGATIVE
Leukocytes,Ua: NEGATIVE
Nitrite: NEGATIVE
Specific Gravity, Urine: 1.015 (ref 1.000–1.030)
Total Protein, Urine: NEGATIVE
Urine Glucose: NEGATIVE
Urobilinogen, UA: 0.2 (ref 0.0–1.0)
pH: 7.5 (ref 5.0–8.0)

## 2021-09-25 MED ORDER — PREDNISONE 10 MG (21) PO TBPK: ORAL_TABLET | ORAL | 0 refills | Status: DC

## 2021-09-25 MED ORDER — HYDROXYZINE HCL 10 MG PO TABS: 10.0000 mg | ORAL_TABLET | Freq: Three times a day (TID) | ORAL | 0 refills | Status: DC | PRN

## 2021-09-25 NOTE — Progress Notes (Signed)
Bacteria seems to resolve, we will wait on urine culture.does have some mucous in urine seen on microscopic.

## 2021-09-25 NOTE — Progress Notes (Signed)
Acute Office Visit  Subjective:    Patient ID: Tiffany Gregory, female    DOB: January 22, 1951, 70 y.o.   MRN: [id number]  Chief Complaint  Patient presents with   Itching    Pt has had itching of her torso. Itching started when pt started keflex for UTI    HPI Patient is in today for itching on her torso, that started while being treated for a urinary tract infection, she was advised to stop keflex when intense itching started and to follow up if not resolved. She was advised to take Benadryl.   Exercises for bells palsy. She is doing. Eye is doing better. With patches and wetting drop. Regaining more movement.  She denies any urinary symptoms now.  She has been being treated for Bells Palsy and has seen neurology Dr. Manuella Gregory for follow up as well.   Patient  denies any fever, body aches,chills, , chest pain, shortness of breath, nausea, vomiting, or diarrhea.  Denies dizziness, lightheadedness, pre syncopal or syncopal episodes.    Past Medical History:  Diagnosis Date   Allergy    Asthma    Diabetes mellitus without complication (HCC)    Elevated liver enzymes    Hepatic steatosis    History of hepatitis B 1971   She had IV drug use experimentation and was hosptialized and reports treated.    Hyperlipidemia    Hypertension     Past Surgical History:  Procedure Laterality Date   ABDOMINAL HYSTERECTOMY     BREAST BIOPSY     CRANIECTOMY     NASAL FRACTURE SURGERY  1975   NASAL SINUS SURGERY  2010    Family History  Problem Relation Age of Onset   Diabetes Mother    Heart attack Mother    Diabetes Father    Heart disease Father    Hypertension Father    Diabetes Sister    COPD Sister    Heart disease Sister    Diabetes Brother    Heart attack Maternal Grandmother    Cancer Maternal Grandfather    Heart attack Paternal Grandmother    Cancer Paternal Grandfather    Heart attack Brother    Heart disease Brother     Social History   Socioeconomic History    Marital status: Married    Spouse name: Tiffany Gregory   Number of children: 2   Years of education: 16   Highest education level: Bachelor's degree (e.g., BA, AB, BS)  Occupational History   Occupation: self employed  Tobacco Use   Smoking status: Never    Passive exposure: Past   Smokeless tobacco: Never  Vaping Use   Vaping Use: Never used  Substance and Sexual Activity   Alcohol use: Not Currently   Drug use: Never   Sexual activity: Not Currently    Partners: Male  Other Topics Concern   Not on file  Social History Narrative   Not on file   Social Determinants of Health   Financial Resource Strain: Medium Risk   Difficulty of Paying Living Expenses: Somewhat hard  Food Insecurity: No Food Insecurity   Worried About Charity fundraiser in the Last Year: Never true   Ran Out of Food in the Last Year: Never true  Transportation Needs: No Transportation Needs   Lack of Transportation (Medical): No   Lack of Transportation (Non-Medical): No  Physical Activity: Inactive   Days of Exercise per Week: 0 days   Minutes of Exercise per Session: 0  min  Stress: Stress Concern Present   Feeling of Stress : Rather much  Social Connections: Moderately Integrated   Frequency of Communication with Friends and Family: More than three times a week   Frequency of Social Gatherings with Friends and Family: More than three times a week   Attends Religious Services: 1 to 4 times per year   Active Member of [employer] or Organizations: No   Attends Archivist Meetings: Never   Marital Status: Married  Human resources officer Violence: Not At Risk   Fear of Current or Ex-Partner: No   Emotionally Abused: No   Physically Abused: No   Sexually Abused: No    Outpatient Medications Prior to Visit  Medication Sig Dispense Refill   acetaminophen (TYLENOL) 325 MG tablet Take 650 mg by mouth every 6 (six) hours as needed.     aspirin EC 81 MG tablet Take 81 mg by mouth daily. Swallow whole.      atorvastatin (LIPITOR) 20 MG tablet Take 1 tablet (20 mg total) by mouth daily. 90 tablet 1   Blood Glucose Monitoring Suppl (ONE TOUCH ULTRA 2) w/Device KIT by Does not apply route.     cyanocobalamin 1000 MCG tablet Take 1 tablet by mouth daily.     cyclobenzaprine (FLEXERIL) 10 MG tablet Take 1 tablet (10 mg total) by mouth at bedtime. 30 tablet 0   gabapentin (NEURONTIN) 100 MG capsule Take 1 capsule (100 mg total) by mouth 3 (three) times daily. 90 capsule 0   glucose blood test strip 1 each by Other route as needed for other. Use as instructed. One touch ultra blue in vitro strips 100 each 3   meloxicam (MOBIC) 7.5 MG tablet Take 7.5 mg by mouth in the morning and at bedtime.     metFORMIN (GLUCOPHAGE XR) 500 MG 24 hr tablet Take 1 tablet (500 mg total) by mouth daily with breakfast. 90 tablet 1   omeprazole (PRILOSEC) 20 MG capsule Take 2 capsules (40 mg total) by mouth daily. 180 capsule 0   OneTouch Delica Lancets 06C MISC by Does not apply route.     polyethylene glycol (MIRALAX / GLYCOLAX) 17 g packet Take 17 g by mouth daily.     ramipril (ALTACE) 10 MG capsule Take 2 capsules (20 mg total) by mouth daily. 180 capsule 1   Semaglutide, 1 MG/DOSE, (OZEMPIC, 1 MG/DOSE,) 4 MG/3ML SOPN Inject 1 mg into the skin once a week. 3 mL 0   vitamin E 180 MG (400 UNITS) capsule Take 400 Units by mouth daily.     Apoaequorin (PREVAGEN PO) Take 1 tablet by mouth daily. (Patient not taking: Reported on 08/29/2021)     potassium chloride (KLOR-CON) 10 MEQ tablet Take 1 tablet (10 mEq total) by mouth daily. (Patient not taking: Reported on 09/25/2021) 20 tablet 0   predniSONE (STERAPRED UNI-PAK 21 TAB) 10 MG (21) TBPK tablet PO: Take 6 tablets on day 1:Take 5 tablets day 2:Take 4 tablets day 3: Take 3 tablets day 4:Take 2 tablets day five: 5 Take 1 tablet day 6 (Patient not taking: Reported on 09/25/2021) 21 tablet 0   valACYclovir (VALTREX) 1000 MG tablet Take 1 tablet (1,000 mg total) by mouth 2  (two) times daily. (Patient not taking: Reported on 09/25/2021) 20 tablet 0   No facility-administered medications prior to visit.    Allergies  Allergen Reactions   Lemon Flavor Anaphylaxis    "my throat closes up"   Keflex [Cephalexin] Itching  Itching and hives no anaphylaxis.    [name] Other (See Comments)   Sulfa Antibiotics Other (See Comments)    Other reaction(s): Unknown    Review of Systems  Constitutional: Negative.   HENT:  Negative for sinus pain.   Respiratory: Negative.    Cardiovascular: Negative.   Genitourinary: Negative.   Musculoskeletal: Negative.   Neurological:  Positive for facial asymmetry and numbness. Negative for dizziness, tremors, seizures, syncope, speech difficulty, weakness, light-headedness and headaches.       Improving facial movement from Bell's palsy, smile returning as well as forehead movement and eye movement. Left side face affected.  Psychiatric/Behavioral: Negative.        Objective:    Physical Exam Vitals and nursing note reviewed.  Constitutional:      Appearance: Normal appearance. She is well-developed and well-groomed. She is not ill-appearing.       Comments: Left side of face from Bell's Palsy still with mild paralysis she is regaining facial movement.   Mild sporadic erythematous macular hives noted appears to be resolving patient reports improved.   HENT:     Head: Normocephalic and atraumatic.     Right Ear: External ear normal.     Left Ear: External ear normal.     Nose: Nose normal.     Mouth/Throat:     Mouth: Mucous membranes are moist.  Eyes:     General: No scleral icterus.       Right eye: No discharge.        Left eye: No discharge.     Conjunctiva/sclera: Conjunctivae normal.     Pupils: Pupils are equal, round, and reactive to light.  Cardiovascular:     Rate and Rhythm: Normal rate and regular rhythm.     Pulses: Normal pulses.     Heart sounds: Normal heart sounds. No murmur  heard.   No friction rub. No gallop.  Pulmonary:     Effort: Pulmonary effort is normal. No respiratory distress.     Breath sounds: Normal breath sounds. No stridor. No wheezing, rhonchi or rales.  Chest:     Chest wall: No tenderness.  Abdominal:     General: Bowel sounds are normal. There is no distension.     Palpations: Abdomen is soft.     Tenderness: There is no abdominal tenderness. There is no guarding.  Genitourinary:    Comments: Deferred.  Musculoskeletal:        General: No tenderness. Normal range of motion.     Cervical back: Normal range of motion and neck supple.     Right lower leg: No edema.     Left lower leg: No edema.  Skin:    General: Skin is warm.     Findings: Erythema and rash present. No lesion. Rash is macular.  Neurological:     Mental Status: She is alert and oriented to person, place, and time.     Motor: No weakness.     Gait: Gait normal.  Psychiatric:        Mood and Affect: Mood normal.        Behavior: Behavior normal.        Thought Content: Thought content normal.        Judgment: Judgment normal.    BP 138/86    Pulse 86    Temp (!) 95.3 F (35.2 C)    Ht 5' 5"  (1.651 m)    Wt 146 lb 12.8 oz (66.6 kg)  SpO2 96%    BMI 24.43 kg/m  Wt Readings from Last 3 Encounters:  09/25/21 146 lb 12.8 oz (66.6 kg)  08/29/21 145 lb 12.8 oz (66.1 kg)  08/22/21 146 lb 12.8 oz (66.6 kg)    Health Maintenance Due  Topic Date Due   Zoster Vaccines- Shingrix (1 of 2) Never done   Pneumonia Vaccine 63+ Years old (2 - PPSV23 if available, else PCV20) 05/15/2018   COVID-19 Vaccine (4 - Booster for Moderna series) 10/22/2020   INFLUENZA VACCINE  05/08/2021   OPHTHALMOLOGY EXAM  08/24/2021    There are no preventive care reminders to display for this patient.   Lab Results  Component Value Date   TSH 1.920 10/27/2020   Lab Results  Component Value Date   WBC 8.7 08/29/2021   HGB 13.1 08/29/2021   HCT 40.4 08/29/2021   MCV 84.8 08/29/2021    PLT 279.0 08/29/2021   Lab Results  Component Value Date   NA 135 08/29/2021   K 3.7 08/29/2021   CO2 29 08/29/2021   GLUCOSE 169 (H) 08/29/2021   BUN 14 08/29/2021   CREATININE 0.84 08/29/2021   BILITOT 0.5 08/29/2021   ALKPHOS 56 08/29/2021   AST 17 08/29/2021   ALT 18 08/29/2021   PROT 6.8 08/29/2021   ALBUMIN 4.2 08/29/2021   CALCIUM 9.2 08/29/2021   ANIONGAP 9 06/20/2020   EGFR 89 01/24/2021   GFR 70.79 08/29/2021   Lab Results  Component Value Date   CHOL 182 08/15/2021   Lab Results  Component Value Date   HDL 37.90 (L) 08/15/2021   Lab Results  Component Value Date   LDLCALC 74 10/27/2020   Lab Results  Component Value Date   TRIG 229.0 (H) 08/15/2021   Lab Results  Component Value Date   CHOLHDL 5 08/15/2021   Lab Results  Component Value Date   HGBA1C 6.4 08/15/2021       Assessment & Plan:   Problem List Items Addressed This Visit   None Visit Diagnoses     Acute cystitis without hematuria    -  Primary   Relevant Orders   Urinalysis, Routine w reflex microscopic   Urine Culture   Rash       Relevant Medications   predniSONE (STERAPRED UNI-PAK 21 TAB) 10 MG (21) TBPK tablet   hydrOXYzine (ATARAX) 10 MG tablet      Questionable keflex allergy versus contact dermatitis.  Will recheck urine.   Meds ordered this encounter  Medications   predniSONE (STERAPRED UNI-PAK 21 TAB) 10 MG (21) TBPK tablet    Sig: PO: Take 6 tablets on day 1:Take 5 tablets day 2:Take 4 tablets day 3: Take 3 tablets day 4:Take 2 tablets day five: 5 Take 1 tablet day 6    Dispense:  21 tablet    Refill:  0   hydrOXYzine (ATARAX) 10 MG tablet    Sig: Take 1 tablet (10 mg total) by mouth 3 (three) times daily as needed (may cause drowsiness.).    Dispense:  30 tablet    Refill:  0   Red Flags discussed. The patient was given clear instructions to go to ER or return to medical center if any red flags develop, symptoms do not improve, worsen or new problems  develop. They verbalized understanding.   Marcille Buffy, FNP

## 2021-09-25 NOTE — Patient Instructions (Signed)
Contact Dermatitis Dermatitis is redness, soreness, and swelling (inflammation) of the skin. Contact dermatitis is a reaction to something that touches the skin. There are two types of contact dermatitis: Irritant contact dermatitis. This happens when something bothers (irritates) your skin, like soap. Allergic contact dermatitis. This is caused when you are exposed to something that you are allergic to, such as poison ivy. What are the causes? Common causes of irritant contact dermatitis include: Makeup. Soaps. Detergents. Bleaches. Acids. Metals, such as nickel. Common causes of allergic contact dermatitis include: Plants. Chemicals. Jewelry. Latex. Medicines. Preservatives in products, such as clothing. What increases the risk? Having a job that exposes you to things that bother your skin. Having asthma or eczema. What are the signs or symptoms? Symptoms may happen anywhere the irritant has touched your skin. Symptoms include: Dry or flaky skin. Redness. Cracks. Itching. Pain or a burning feeling. Blisters. Blood or clear fluid draining from skin cracks. With allergic contact dermatitis, swelling may occur. This may happen in places such as the eyelids, mouth, or genitals. How is this treated? This condition is treated by checking for the cause of the reaction and protecting your skin. Treatment may also include: Steroid creams, ointments, or medicines. Antibiotic medicines or other ointments, if you have a skin infection. Lotion or medicines to help with itching. A bandage (dressing). Follow these instructions at home: Skin care Moisturize your skin as needed. Put cool cloths on your skin. Put a baking soda paste on your skin. Stir water into baking soda until it looks like a paste. Do not scratch your skin. Avoid having things rub up against your skin. Avoid the use of soaps, perfumes, and dyes. Medicines Take or apply over-the-counter and prescription medicines  only as told by your doctor. If you were prescribed an antibiotic medicine, take or apply it as told by your doctor. Do not stop using it even if your condition starts to get better. Bathing Take a bath with: Epsom salts. Baking soda. Colloidal oatmeal. Bathe less often. Bathe in warm water. Avoid using hot water. Bandage care If you were given a bandage, change it as told by your health care provider. Wash your hands with soap and water before and after you change your bandage. If soap and water are not available, use hand sanitizer. General instructions Avoid the things that caused your reaction. If you do not know what caused it, keep a journal. Write down: What you eat. What skin products you use. What you drink. What you wear in the area that has symptoms. This includes jewelry. Check the affected areas every day for signs of infection. Check for: More redness, swelling, or pain. More fluid or blood. Warmth. Pus or a bad smell. Keep all follow-up visits as told by your doctor. This is important. Contact a doctor if: You do not get better with treatment. Your condition gets worse. You have signs of infection, such as: More swelling. Tenderness. More redness. Soreness. Warmth. You have a fever. You have new symptoms. Get help right away if: You have a very bad headache. You have neck pain. Your neck is stiff. You throw up (vomit). You feel very sleepy. You see red streaks coming from the area. Your bone or joint near the area hurts after the skin has healed. The area turns darker. You have trouble breathing. Summary Dermatitis is redness, soreness, and swelling of the skin. Symptoms may occur where the irritant has touched you. Treatment may include medicines and skin care. If you  do not know what caused your reaction, keep a journal. Contact a doctor if your condition gets worse or you have signs of infection. This information is not intended to replace advice  given to you by your health care provider. Make sure you discuss any questions you have with your health care provider. Document Revised: 01/14/2019 Document Reviewed: 04/09/2018 Elsevier Patient Education  [place]. Prednisolone Tablets What is this medication? PREDNISOLONE (pred NISS oh lone) treats many conditions such as asthma, allergic reactions, arthritis, inflammatory bowel diseases, adrenal, and blood or bone marrow disorders. It works by decreasing inflammation, slowing down an overactive immune system, or replacing cortisol normally made in the body. Cortisol is a hormone that plays an important role in how the body responds to stress, illness, and injury. It belongs to a group of medications called steroids. This medicine may be used for other purposes; ask your health care provider or pharmacist if you have questions. COMMON BRAND NAME(S): Millipred, Millipred DP, Millipred DP 12-Day, Millipred DP 6 Day, Prednoral What should I tell my care team before I take this medication? They need to know if you have any of these conditions: Cushing's syndrome Diabetes Glaucoma Heart problems or disease High blood pressure Infection such as herpes, measles, tuberculosis, or chickenpox Kidney disease Liver disease Mental problems Myasthenia gravis Osteoporosis Seizures Stomach ulcer or intestine disease including colitis and diverticulitis Thyroid problem An unusual or allergic reaction to lactose, prednisolone, other medications, foods, dyes, or preservatives Pregnant or trying to get pregnant Breast-feeding How should I use this medication? Take this medication by mouth with a glass of water. Follow the directions on the prescription label. Take it with food or milk to avoid stomach upset. If you are taking this medication once a day, take it in the morning. Do not take more medication than you are told to take. Do not suddenly stop taking your medication because you may  develop a severe reaction. Your care team will tell you how much medication to take. If your care team wants you to stop the medication, the dose may be slowly lowered over time to avoid any side effects. Talk to your care team about the use of this medication in children. Special care may be needed. Overdosage: If you think you have taken too much of this medicine contact a poison control center or emergency room at once. NOTE: This medicine is only for you. Do not share this medicine with others. What if I miss a dose? If you miss a dose, take it as soon as you can. If it is almost time for your next dose, take only that dose. Do not take double or extra doses. What may interact with this medication? Do not take this medication with any of the following: Metyrapone Mifepristone This medication may also interact with the following: Aminoglutethimide Amphotericin B Aspirin and aspirin-like medications Barbiturates Certain medications for diabetes, like glipizide or glyburide Cholestyramine Cholinesterase inhibitors Cyclosporine Digoxin Diuretics Ephedrine Female hormones, like estrogens and birth control pills Isoniazid Ketoconazole NSAIDS, medications for pain and inflammation, like ibuprofen or naproxen Phenytoin Rifampin Toxoids Vaccines Warfarin This list may not describe all possible interactions. Give your health care provider a list of all the medicines, herbs, non-prescription drugs, or dietary supplements you use. Also tell them if you smoke, drink alcohol, or use illegal drugs. Some items may interact with your medicine. What should I watch for while using this medication? Visit your care team for regular checks on your progress. If  you are taking this medication over a prolonged period, carry an identification card with your name and address, the type and dose of your medication, and your care team's name and address. This medication may increase your risk of getting an  infection. Tell your care team if you are around anyone with measles or chickenpox, or if you develop sores or blisters that do not heal properly. If you are going to have surgery, tell your care team that you have taken this medication within the last twelve months. Ask your care team about your diet. You may need to lower the amount of salt you eat. This medication may increase blood sugar. Ask your care team if changes in diet or medications are needed if you have diabetes. What side effects may I notice from receiving this medication? Side effects that you should report to your care team as soon as possible: Allergic reactions--skin rash, itching, hives, swelling of the face, lips, tongue, or throat Cushing syndrome--increased fat around the midsection, upper back, neck, or face, pink or purple stretch marks on the skin, thinning, fragile skin that easily bruises, unexpected hair growth High blood sugar (hyperglycemia)--increased thirst or amount of urine, unusual weakness or fatigue, blurry vision Increase in blood pressure Infection--fever, chills, cough, sore throat, wounds that don't heal, pain or trouble when passing urine, general feeling of discomfort or being unwell Low adrenal gland function--nausea, vomiting, loss of appetite, unusual weakness or fatigue, dizziness Mood and behavior changes--anxiety, nervousness, confusion, hallucinations, irritability, hostility, thoughts of suicide or self-harm, worsening mood, feelings of depression Stomach bleeding--bloody or black, tar-like stools, vomiting blood or brown material that looks like coffee grounds Swelling of the ankles, hands, or feet Side effects that usually do not require medical attention (report to your care team if they continue or are bothersome): Acne General discomfort and fatigue Headache Increase in appetite Nausea Trouble sleeping Weight gain This list may not describe all possible side effects. Call your doctor  for medical advice about side effects. You may report side effects to FDA at 1-800-FDA-1088. Where should I keep my medication? Keep out of the reach of children. Store at room temperature between 15 and 30 degrees C (59 and 86 degrees F). Keep container tightly closed. Throw away any unused medication after the expiration date. NOTE: This sheet is a summary. It may not cover all possible information. If you have questions about this medicine, talk to your doctor, pharmacist, or health care provider.  2022 Elsevier/Gold Standard (2020-12-23 00:00:00) Hydroxyzine Capsules or Tablets What is this medication? HYDROXYZINE (hye [place] i zeen) treats the symptoms of allergies and allergic reactions. It may also be used to treat anxiety or cause drowsiness before a procedure. It works by blocking histamine, a substance released by the body during an allergic reaction. It belongs to a group of medications called antihistamines. This medicine may be used for other purposes; ask your health care provider or pharmacist if you have questions. COMMON BRAND NAME(S): ANX, Atarax, Rezine, Vistaril What should I tell my care team before I take this medication? They need to know if you have any of these conditions: Glaucoma Heart disease History of irregular heartbeat Kidney disease Liver disease Lung or breathing disease, like asthma Stomach or intestine problems Thyroid disease Trouble passing urine An unusual or allergic reaction to hydroxyzine, cetirizine, other medications, foods, dyes or preservatives Pregnant or trying to get pregnant Breast-feeding How should I use this medication? Take this medication by mouth with a full glass of  water. Follow the directions on the prescription label. You may take this medication with food or on an empty stomach. Take your medication at regular intervals. Do not take your medication more often than directed. Talk to your care team regarding the use of this  medication in children. Special care may be needed. While this medication may be prescribed for children as young as 65 years of age for selected conditions, precautions do apply. Patients over 8 years old may have a stronger reaction and need a smaller dose. Overdosage: If you think you have taken too much of this medicine contact a poison control center or emergency room at once. NOTE: This medicine is only for you. Do not share this medicine with others. What if I miss a dose? If you miss a dose, take it as soon as you can. If it is almost time for your next dose, take only that dose. Do not take double or extra doses. What may interact with this medication? Do not take this medication with any of the following: Cisapride Dronedarone Pimozide Thioridazine This medication may also interact with the following: Alcohol Antihistamines for allergy, cough, and cold Atropine Barbiturate medications for sleep or seizures, like phenobarbital Certain antibiotics like erythromycin or clarithromycin Certain medications for anxiety or sleep Certain medications for bladder problems like oxybutynin, tolterodine Certain medications for depression or psychotic disturbances Certain medications for irregular heart beat Certain medications for Parkinson's disease like benztropine, trihexyphenidyl Certain medications for seizures like phenobarbital, primidone Certain medications for stomach problems like dicyclomine, hyoscyamine Certain medications for travel sickness like scopolamine Ipratropium Narcotic medications for pain Other medications that prolong the QT interval (which can cause an abnormal heart rhythm) like dofetilide This list may not describe all possible interactions. Give your health care provider a list of all the medicines, herbs, non-prescription drugs, or dietary supplements you use. Also tell them if you smoke, drink alcohol, or use illegal drugs. Some items may interact with your  medicine. What should I watch for while using this medication? Tell your care team if your symptoms do not improve. You may get drowsy or dizzy. Do not drive, use machinery, or do anything that needs mental alertness until you know how this medication affects you. Do not stand or sit up quickly, especially if you are an older patient. This reduces the risk of dizzy or fainting spells. Alcohol may interfere with the effect of this medication. Avoid alcoholic drinks. Your mouth may get dry. Chewing sugarless gum or sucking hard candy, and drinking plenty of water may help. Contact your care team if the problem does not go away or is severe. This medication may cause dry eyes and blurred vision. If you wear contact lenses you may feel some discomfort. Lubricating drops may help. See your eye care specialist if the problem does not go away or is severe. If you are receiving skin tests for allergies, tell your care team you are using this medication. What side effects may I notice from receiving this medication? Side effects that you should report to your care team as soon as possible: Allergic reactions--skin rash, itching, hives, swelling of the face, lips, tongue, or throat Heart rhythm changes--fast or irregular heartbeat, dizziness, feeling faint or lightheaded, chest pain, trouble breathing Side effects that usually do not require medical attention (report to your care team if they continue or are bothersome): Confusion Drowsiness Dry mouth Hallucinations Headache This list may not describe all possible side effects. Call your doctor for medical advice  about side effects. You may report side effects to FDA at 1-800-FDA-1088. Where should I keep my medication? Keep out of the reach of children and pets. Store at room temperature between 15 and 30 degrees C (59 and 86 degrees F). Keep container tightly closed. Throw away any unused medication after the expiration date. NOTE: This sheet is a  summary. It may not cover all possible information. If you have questions about this medicine, talk to your doctor, pharmacist, or health care provider.  2022 Elsevier/Gold Standard (2020-12-07 00:00:00)

## 2021-09-26 ENCOUNTER — Other Ambulatory Visit: Payer: Self-pay | Admitting: Adult Health

## 2021-09-26 DIAGNOSIS — E785 Hyperlipidemia, unspecified: Secondary | ICD-10-CM

## 2021-09-26 MED ORDER — ATORVASTATIN CALCIUM 20 MG PO TABS
20.0000 mg | ORAL_TABLET | Freq: Every day | ORAL | 1 refills | Status: DC
Start: 2021-09-26 — End: 2021-12-07

## 2021-09-26 NOTE — Telephone Encounter (Signed)
Medication Refill - Medication:atorvastatin (LIPITOR) 20 MG tablet  Has the patient contacted their pharmacy? yes (Agent: If no, request that the patient contact the pharmacy for the refill. If patient does not wish to contact the pharmacy document the reason why and proceed with request.) (Agent: If yes, when and what did the pharmacy advise?)contact pcp  Preferred Pharmacy (with phone number or street name):[id number]  Phone:  [phone] Fax:  [phone] Has the patient been seen for an appointment in the last year OR does the patient have an upcoming appointment? yes  Agent: Please be advised that RX refills may take up to 3 business days. We ask that you follow-up with your pharmacy.

## 2021-09-27 ENCOUNTER — Other Ambulatory Visit: Payer: Medicare Other

## 2021-09-27 LAB — URINE CULTURE
MICRO NUMBER:: 12774138
SPECIMEN QUALITY:: ADEQUATE

## 2021-09-28 ENCOUNTER — Telehealth: Payer: Self-pay

## 2021-09-28 ENCOUNTER — Other Ambulatory Visit: Payer: Self-pay | Admitting: Adult Health

## 2021-09-28 DIAGNOSIS — R399 Unspecified symptoms and signs involving the genitourinary system: Secondary | ICD-10-CM

## 2021-09-28 DIAGNOSIS — G510 Bell's palsy: Secondary | ICD-10-CM | POA: Diagnosis not present

## 2021-09-28 DIAGNOSIS — N3000 Acute cystitis without hematuria: Secondary | ICD-10-CM

## 2021-09-28 MED ORDER — NITROFURANTOIN MONOHYD MACRO 100 MG PO CAPS: 100.0000 mg | ORAL_CAPSULE | Freq: Two times a day (BID) | ORAL | 0 refills | Status: DC

## 2021-09-28 NOTE — Telephone Encounter (Signed)
Placed orders for UA and urine culture for future labs

## 2021-09-28 NOTE — Progress Notes (Signed)
Still have some infection in urine, have sent Macrobid as below to take.  Recheck urine 2 weeks after completion please schedule urinalysis and culture.   Return if any symptoms worsening at anytime.   Meds ordered this encounter Medications  nitrofurantoin, macrocrystal-monohydrate, (MACROBID) 100 MG capsule   Sig: Take 1 capsule (100 mg total) by mouth 2 (two) times daily.   Dispense:  10 capsule   Refill:  0

## 2021-09-28 NOTE — Progress Notes (Signed)
Meds ordered this encounter  Medications   nitrofurantoin, macrocrystal-monohydrate, (MACROBID) 100 MG capsule    Sig: Take 1 capsule (100 mg total) by mouth 2 (two) times daily.    Dispense:  10 capsule    Refill:  0    

## 2021-09-30 ENCOUNTER — Other Ambulatory Visit: Payer: Self-pay | Admitting: Internal Medicine

## 2021-09-30 DIAGNOSIS — E119 Type 2 diabetes mellitus without complications: Secondary | ICD-10-CM

## 2021-10-04 ENCOUNTER — Encounter: Payer: Self-pay | Admitting: Adult Health

## 2021-10-11 ENCOUNTER — Telehealth: Payer: Self-pay

## 2021-10-11 ENCOUNTER — Telehealth: Payer: Self-pay | Admitting: Pharmacy Technician

## 2021-10-11 DIAGNOSIS — Z596 Low income: Secondary | ICD-10-CM

## 2021-10-11 NOTE — Progress Notes (Signed)
[facility])                                            [place] Team    10/11/2021  Sheketa Ende [birth year] [id number]  Received both patient and provider portion(s) of patient assistance application(s) for Ozempic. Faxed completed application and required documents into [employer].    Banessa Mao P. Charisa Twitty, [place]  [phone]

## 2021-10-11 NOTE — Telephone Encounter (Signed)
Received patient assistance meds for pt. Ozempic 4mg /30ml. Contacted pt to let her know medication is ready to be picked up.

## 2021-10-18 ENCOUNTER — Ambulatory Visit (INDEPENDENT_AMBULATORY_CARE_PROVIDER_SITE_OTHER): Payer: Medicare Other | Admitting: *Deleted

## 2021-10-18 DIAGNOSIS — E119 Type 2 diabetes mellitus without complications: Secondary | ICD-10-CM

## 2021-10-18 DIAGNOSIS — E785 Hyperlipidemia, unspecified: Secondary | ICD-10-CM

## 2021-10-18 DIAGNOSIS — Z862 Personal history of diseases of the blood and blood-forming organs and certain disorders involving the immune mechanism: Secondary | ICD-10-CM

## 2021-10-18 DIAGNOSIS — E1169 Type 2 diabetes mellitus with other specified complication: Secondary | ICD-10-CM

## 2021-10-18 DIAGNOSIS — Z9889 Other specified postprocedural states: Secondary | ICD-10-CM

## 2021-10-18 DIAGNOSIS — W19XXXD Unspecified fall, subsequent encounter: Secondary | ICD-10-CM

## 2021-10-18 DIAGNOSIS — R413 Other amnesia: Secondary | ICD-10-CM

## 2021-10-18 DIAGNOSIS — I10 Essential (primary) hypertension: Secondary | ICD-10-CM

## 2021-10-18 DIAGNOSIS — R2689 Other abnormalities of gait and mobility: Secondary | ICD-10-CM

## 2021-10-18 DIAGNOSIS — G510 Bell's palsy: Secondary | ICD-10-CM

## 2021-10-18 DIAGNOSIS — M62838 Other muscle spasm: Secondary | ICD-10-CM

## 2021-10-18 DIAGNOSIS — T732XXS Exhaustion due to exposure, sequela: Secondary | ICD-10-CM

## 2021-10-18 DIAGNOSIS — E876 Hypokalemia: Secondary | ICD-10-CM

## 2021-10-18 NOTE — Patient Instructions (Signed)
Visit Information  Thank you for taking time to visit with me today. Please don't hesitate to contact me if I can be of assistance to you before our next scheduled telephone appointment.  Following are the goals we discussed today:  Patient Goals/Self-Care Activities: Continue to receive personal counseling with LCSW, on a monthly basis, to reduce and manage symptoms of Anxiety and Caregiver Stress, until manageable.  Attend follow-up pulmonary appointment with husband, scheduled for 10/24/2021, to gain more knowledge and insight into illness, terminal diagnosis, prognosis, treatment options, surgical interventions, etc.   Begin to review information provided, as well as perform independent search, of benefits and services offered through hospice and palliative care.   ~  [place] [phone]) ~  [place] [phone]) ~  [place] [phone]) Contact LCSW directly [phone]) if you have questions, need assistance, or if additional social work needs are identified between now and our next scheduled telephone outreach call.   Follow-Up:  11/14/2021 at 9:00 am  Please call the care guide team at [phone] if you need to cancel or reschedule your appointment.   If you are experiencing a Mental Health or [place] or need someone to talk to, please call the Suicide and Crisis Lifeline: 988 call the [place] National Suicide Prevention Lifeline: 714-261-3972 or TTY: 906-079-7140 TTY (670)761-5972) to talk to a trained counselor call 1-800-273-TALK (toll free, 24 hour hotline) go to [facility] [address], [place] [phone]) call the [place]: [phone] call 911   Patient verbalizes understanding of instructions and care plan provided today and agrees to view in [place]. Active MyChart  status confirmed with patient.    Nat Christen LCSW Licensed Clinical Social Worker [place]  [phone]

## 2021-10-18 NOTE — Chronic Care Management (AMB) (Signed)
Chronic Care Management    Clinical Social Work Note  10/18/2021 Name: Tiffany Gregory MRN: [id number] DOB: 03/09/1951  Tiffany Gregory is a 71 y.o. year old female who is a primary care patient of [name], [name], FNP. The CCM team was consulted to assist the patient with chronic disease management and/or care coordination needs related to: [employer], Mental Health Counseling and Resources, Grief Counseling, Quarry manager Education, and Caregiver Stress.   Engaged with patient by telephone for follow up visit in response to provider referral for social work chronic care management and care coordination services.   Consent to Services:  The patient was given information about Chronic Care Management services, agreed to services, and gave verbal consent prior to initiation of services.  Please see initial visit note for detailed documentation.   Patient agreed to services and consent obtained.   Assessment: Review of patient past medical history, allergies, medications, and health status, including review of relevant consultants reports was performed today as part of a comprehensive evaluation and provision of chronic care management and care coordination services.     SDOH (Social Determinants of Health) assessments and interventions performed:    Advanced Directives Status: Not addressed in this encounter.  CCM Care Plan  Allergies  Allergen Reactions   Lemon Flavor Anaphylaxis    "my throat closes up"   Keflex [Cephalexin] Itching    Itching and hives no anaphylaxis.    [name] Other (See Comments)   Sulfa Antibiotics Other (See Comments)    Other reaction(s): Unknown    Outpatient Encounter Medications as of 10/18/2021  Medication Sig Note   acetaminophen (TYLENOL) 325 MG tablet Take 650 mg by mouth every 6 (six) hours as needed.    Apoaequorin (PREVAGEN PO) Take 1 tablet by mouth daily. (Patient not taking: Reported on 08/29/2021)     aspirin EC 81 MG tablet Take 81 mg by mouth daily. Swallow whole.    atorvastatin (LIPITOR) 20 MG tablet Take 1 tablet (20 mg total) by mouth daily.    Blood Glucose Monitoring Suppl (ONE TOUCH ULTRA 2) w/Device KIT by Does not apply route.    cyanocobalamin 1000 MCG tablet Take 1 tablet by mouth daily.    cyclobenzaprine (FLEXERIL) 10 MG tablet Take 1 tablet (10 mg total) by mouth at bedtime.    gabapentin (NEURONTIN) 100 MG capsule Take 1 capsule (100 mg total) by mouth 3 (three) times daily.    glucose blood test strip 1 each by Other route as needed for other. Use as instructed. One touch ultra blue in vitro strips    hydrOXYzine (ATARAX) 10 MG tablet Take 1 tablet (10 mg total) by mouth 3 (three) times daily as needed (may cause drowsiness.).    meloxicam (MOBIC) 7.5 MG tablet Take 7.5 mg by mouth in the morning and at bedtime. 04/07/2021: Taking once daily   metFORMIN (GLUCOPHAGE-XR) 500 MG 24 hr tablet TAKE 1 TABLET(500 MG) BY MOUTH DAILY WITH BREAKFAST    nitrofurantoin, macrocrystal-monohydrate, (MACROBID) 100 MG capsule Take 1 capsule (100 mg total) by mouth 2 (two) times daily.    omeprazole (PRILOSEC) 20 MG capsule Take 2 capsules (40 mg total) by mouth daily.    OneTouch Delica Lancets 27C MISC by Does not apply route.    polyethylene glycol (MIRALAX / GLYCOLAX) 17 g packet Take 17 g by mouth daily.    potassium chloride (KLOR-CON) 10 MEQ tablet Take 1 tablet (10 mEq total) by mouth daily. (Patient not taking: Reported on  09/25/2021)    predniSONE (STERAPRED UNI-PAK 21 TAB) 10 MG (21) TBPK tablet PO: Take 6 tablets on day 1:Take 5 tablets day 2:Take 4 tablets day 3: Take 3 tablets day 4:Take 2 tablets day five: 5 Take 1 tablet day 6    ramipril (ALTACE) 10 MG capsule Take 2 capsules (20 mg total) by mouth daily.    Semaglutide, 1 MG/DOSE, (OZEMPIC, 1 MG/DOSE,) 4 MG/3ML SOPN Inject 1 mg into the skin once a week.    vitamin E 180 MG (400 UNITS) capsule Take 400 Units by mouth daily.     No facility-administered encounter medications on file as of 10/18/2021.    Patient Active Problem List   Diagnosis Date Noted   Fatigue due to exposure 08/29/2021   Hypokalemia 08/29/2021   Nerve pain 08/29/2021   History of leukocytosis 08/22/2021   Non-recurrent acute serous otitis media of left ear 08/22/2021   Balance problem 08/22/2021   Bell's palsy 08/12/2021   Nasal congestion 02/14/2021   Spasm of cervical paraspinous muscle 01/24/2021   B12 deficiency 01/24/2021   Neck pain 01/24/2021   Fall 01/24/2021   Gastroesophageal reflux disease 11/29/2020   Memory loss 10/27/2020   History of craniotomy 10/27/2020   Family history of GERD 10/27/2020   Hyperlipidemia 06/22/2020   Elevated liver enzymes 06/22/2020   History of hepatitis B virus infection 06/22/2020   Overweight with body mass index (BMI) of 27 to 27.9 in adult 06/22/2020   Hepatic steatosis 06/15/2020   Encounter for medical examination to establish care 06/14/2020   Type 2 diabetes mellitus without complication, without long-term current use of insulin ([place]) 06/14/2020   Essential hypertension 06/14/2020   Skin lesion 06/14/2020    Conditions to be addressed/monitored: Anxiety and Depression.  Limited Social Support, Mental Health Concerns, Social Isolation, Limited Access to Caregiver, and Lacks Knowledge of [employer].  Care Plan : LCSW Plan of Care  Updates made by Francis Gaines, LCSW since 10/18/2021 12:00 AM     Problem: Find Caregiver Support and Counseling Services in My Community.   Priority: High     Long-Range Goal: Find Acupuncturist in My Community.   Start Date: 06/08/2021  Expected End Date: 11/08/2021  This Visit's Progress: On track  Recent Progress: On track  Priority: High  Note:   Current Barriers:   Acute Mental Health needs related to Anxiety and Caregiver Stress, requires Support, Education, Resources, Referrals and Care Coordination in  order to meet unmet mental health needs. Clinical Goal(s):  Patient will work with LCSW to reduce and manage symptoms of Anxiety and Caregiver Stress.     Clinical Interventions:  Active Listening utilized, Emotional Support provided, Cognitive Behavioral Therapy performed, Caregiver Stress acknowledged, Problem-Solving Solutions identified, and Verbalization of Feelings encouraged. Support and empathy provided when patient discussed the recent findings of her husband's terminal illness, with a prognosis of six months or less.      Collaboration with Primary Care Physician, Dr. [name] regarding development and update of comprehensive plan of care as evidenced by provider attestation and co-signature. Patient Goals/Self-Care Activities: Continue to receive personal counseling with LCSW, on a monthly basis, to reduce and manage symptoms of Anxiety and Caregiver Stress, until manageable.  Attend follow-up pulmonary appointment with husband, scheduled for 10/24/2021, to gain more knowledge and insight into illness, terminal diagnosis, prognosis, treatment options, surgical interventions, etc.   Begin to review information provided, as well as perform independent search, of benefits and services  offered through hospice and palliative care.   ~  [place] [phone]) ~  [place] [phone]) ~  [place] [phone]) Contact LCSW directly [phone]) if you have questions, need assistance, or if additional social work needs are identified between now and our next scheduled telephone outreach call.   Follow-Up:  11/14/2021 at 9:00 am     [place] Clinical Social Worker [place]  [phone]

## 2021-10-20 ENCOUNTER — Other Ambulatory Visit: Payer: Self-pay

## 2021-10-20 ENCOUNTER — Other Ambulatory Visit (INDEPENDENT_AMBULATORY_CARE_PROVIDER_SITE_OTHER): Payer: Medicare Other

## 2021-10-20 DIAGNOSIS — N3000 Acute cystitis without hematuria: Secondary | ICD-10-CM | POA: Diagnosis not present

## 2021-10-20 LAB — URINALYSIS, ROUTINE W REFLEX MICROSCOPIC
Bilirubin Urine: NEGATIVE
Hgb urine dipstick: NEGATIVE
Ketones, ur: NEGATIVE
Leukocytes,Ua: NEGATIVE
Nitrite: NEGATIVE
Specific Gravity, Urine: 1.01 (ref 1.000–1.030)
Total Protein, Urine: NEGATIVE
Urine Glucose: NEGATIVE
Urobilinogen, UA: 1 (ref 0.0–1.0)
pH: 6.5 (ref 5.0–8.0)

## 2021-10-21 LAB — URINE CULTURE
MICRO NUMBER:: 12868696
Result:: NO GROWTH
SPECIMEN QUALITY:: ADEQUATE

## 2021-10-21 NOTE — Progress Notes (Signed)
Negative urine culture and urinalysis. Follow treatment plan from office  if not improving or any worsening within 72 hours and also return to office or open medical facility at ANYTIME if any symptoms persist, change, or worsen or you have any further concerns or questions. Call 911 immediately for emergencies.

## 2021-10-27 ENCOUNTER — Telehealth: Payer: Self-pay | Admitting: Pharmacy Technician

## 2021-10-27 DIAGNOSIS — Z596 Low income: Secondary | ICD-10-CM

## 2021-10-27 NOTE — Progress Notes (Signed)
[facility])                                            [place] Team    10/27/2021  Danique Hartsough January 07, 1951 [id number]  Care coordination calls placed to [place] in regard to patient's Ozempic application.  Spoke to [place] at [employer] who informs patient is APPROVED 10/08/21-10/07/22. Refills will auto process and ship to provider's office based on last refill in 2022 and going forward with delivery to the provider's office.  Taleya Whitcher P. Isam Unrein, [place]  [phone]

## 2021-11-01 ENCOUNTER — Telehealth: Payer: Self-pay | Admitting: Pharmacist

## 2021-11-01 ENCOUNTER — Ambulatory Visit: Payer: Medicare Other | Admitting: *Deleted

## 2021-11-01 DIAGNOSIS — E785 Hyperlipidemia, unspecified: Secondary | ICD-10-CM

## 2021-11-01 DIAGNOSIS — I10 Essential (primary) hypertension: Secondary | ICD-10-CM

## 2021-11-01 DIAGNOSIS — R413 Other amnesia: Secondary | ICD-10-CM

## 2021-11-01 DIAGNOSIS — E876 Hypokalemia: Secondary | ICD-10-CM

## 2021-11-01 DIAGNOSIS — M792 Neuralgia and neuritis, unspecified: Secondary | ICD-10-CM

## 2021-11-01 DIAGNOSIS — E119 Type 2 diabetes mellitus without complications: Secondary | ICD-10-CM

## 2021-11-01 DIAGNOSIS — H6502 Acute serous otitis media, left ear: Secondary | ICD-10-CM

## 2021-11-01 DIAGNOSIS — M62838 Other muscle spasm: Secondary | ICD-10-CM

## 2021-11-01 DIAGNOSIS — G510 Bell's palsy: Secondary | ICD-10-CM

## 2021-11-01 DIAGNOSIS — Z9889 Other specified postprocedural states: Secondary | ICD-10-CM

## 2021-11-01 DIAGNOSIS — T732XXS Exhaustion due to exposure, sequela: Secondary | ICD-10-CM

## 2021-11-01 NOTE — Patient Instructions (Signed)
Visit Information  Thank you for taking time to visit with me today. Please don't hesitate to contact me if I can be of assistance to you before our next scheduled telephone appointment.  Following are the goals we discussed today:  Patient Goals/Self-Care Activities: Continue to receive personal counseling with LCSW, on a monthly basis, to reduce and manage symptoms of Anxiety and Caregiver Stress, until manageable.  Continue to review information provided, as well as perform independent search, of benefits and services offered through hospice and palliative care.   ~  [place] [phone]) ~  [place] [phone]) ~  [place] [phone]) Please accept all calls from Alphonzo Severance, Embedded Pharmacist at [facility], in response to your denial letter to receive financial assistance for Ozempic. Contact LCSW directly if you have questions, need assistance, or if additional social work needs are identified between now and our next scheduled telephone outreach call.   Follow-Up:  Scheduling Care Guide will schedule patient's follow-up telephone outreach call with Chrystal Land, newly Embedded LCSW at [facility].  Please call the care guide team at [phone] if you need to cancel or reschedule your appointment.   If you are experiencing a Mental Health or [place] or need someone to talk to, please call the Suicide and Crisis Lifeline: 988 call the [place] National Suicide Prevention Lifeline: 330-759-8266 or TTY: (534)804-1839 TTY 901-683-4184) to talk to a trained counselor call 1-800-273-TALK (toll free, 24 hour hotline) go to [facility] [address], [place] [phone]) call the [place]: [phone] call 911   Patient verbalizes  understanding of instructions and care plan provided today and agrees to view in [place]. Active MyChart status confirmed with patient.    Nat Christen LCSW Licensed Clinical Social Worker [place]  [phone]

## 2021-11-01 NOTE — Chronic Care Management (AMB) (Signed)
Chronic Care Management    Clinical Social Work Note  11/01/2021 Name: Tiffany Gregory MRN: [id number] DOB: 10/15/1950  Tiffany Gregory is a 71 y.o. year old female who is a primary care patient of [name], [name], FNP. The CCM team was consulted to assist the patient with chronic disease management and/or care coordination needs related to: [employer], Mental Health Counseling and Resources, and Caregiver Stress.   Engaged with patient by telephone for follow up visit in response to provider referral for social work chronic care management and care coordination services.   Consent to Services:  The patient was given information about Chronic Care Management services, agreed to services, and gave verbal consent prior to initiation of services.  Please see initial visit note for detailed documentation.   Patient agreed to services and consent obtained.   Assessment: Review of patient past medical history, allergies, medications, and health status, including review of relevant consultants reports was performed today as part of a comprehensive evaluation and provision of chronic care management and care coordination services.     SDOH (Social Determinants of Health) assessments and interventions performed:    Advanced Directives Status: Not addressed in this encounter.  CCM Care Plan  Allergies  Allergen Reactions   Lemon Flavor Anaphylaxis    "my throat closes up"   Keflex [Cephalexin] Itching    Itching and hives no anaphylaxis.    [name] Other (See Comments)   Sulfa Antibiotics Other (See Comments)    Other reaction(s): Unknown    Outpatient Encounter Medications as of 11/01/2021  Medication Sig Note   acetaminophen (TYLENOL) 325 MG tablet Take 650 mg by mouth every 6 (six) hours as needed.    Apoaequorin (PREVAGEN PO) Take 1 tablet by mouth daily. (Patient not taking: Reported on 08/29/2021)    aspirin EC 81 MG tablet Take 81 mg by mouth daily. Swallow  whole.    atorvastatin (LIPITOR) 20 MG tablet Take 1 tablet (20 mg total) by mouth daily.    Blood Glucose Monitoring Suppl (ONE TOUCH ULTRA 2) w/Device KIT by Does not apply route.    cyanocobalamin 1000 MCG tablet Take 1 tablet by mouth daily.    cyclobenzaprine (FLEXERIL) 10 MG tablet Take 1 tablet (10 mg total) by mouth at bedtime.    gabapentin (NEURONTIN) 100 MG capsule Take 1 capsule (100 mg total) by mouth 3 (three) times daily.    glucose blood test strip 1 each by Other route as needed for other. Use as instructed. One touch ultra blue in vitro strips    hydrOXYzine (ATARAX) 10 MG tablet Take 1 tablet (10 mg total) by mouth 3 (three) times daily as needed (may cause drowsiness.).    meloxicam (MOBIC) 7.5 MG tablet Take 7.5 mg by mouth in the morning and at bedtime. 04/07/2021: Taking once daily   metFORMIN (GLUCOPHAGE-XR) 500 MG 24 hr tablet TAKE 1 TABLET(500 MG) BY MOUTH DAILY WITH BREAKFAST    nitrofurantoin, macrocrystal-monohydrate, (MACROBID) 100 MG capsule Take 1 capsule (100 mg total) by mouth 2 (two) times daily.    omeprazole (PRILOSEC) 20 MG capsule Take 2 capsules (40 mg total) by mouth daily.    OneTouch Delica Lancets 17B MISC by Does not apply route.    polyethylene glycol (MIRALAX / GLYCOLAX) 17 g packet Take 17 g by mouth daily.    potassium chloride (KLOR-CON) 10 MEQ tablet Take 1 tablet (10 mEq total) by mouth daily. (Patient not taking: Reported on 09/25/2021)    predniSONE (STERAPRED  UNI-PAK 21 TAB) 10 MG (21) TBPK tablet PO: Take 6 tablets on day 1:Take 5 tablets day 2:Take 4 tablets day 3: Take 3 tablets day 4:Take 2 tablets day five: 5 Take 1 tablet day 6    ramipril (ALTACE) 10 MG capsule Take 2 capsules (20 mg total) by mouth daily.    Semaglutide, 1 MG/DOSE, (OZEMPIC, 1 MG/DOSE,) 4 MG/3ML SOPN Inject 1 mg into the skin once a week.    vitamin E 180 MG (400 UNITS) capsule Take 400 Units by mouth daily.    No facility-administered encounter medications on file as  of 11/01/2021.    Patient Active Problem List   Diagnosis Date Noted   Fatigue due to exposure 08/29/2021   Hypokalemia 08/29/2021   Nerve pain 08/29/2021   History of leukocytosis 08/22/2021   Non-recurrent acute serous otitis media of left ear 08/22/2021   Balance problem 08/22/2021   Bell's palsy 08/12/2021   Nasal congestion 02/14/2021   Spasm of cervical paraspinous muscle 01/24/2021   B12 deficiency 01/24/2021   Neck pain 01/24/2021   Fall 01/24/2021   Gastroesophageal reflux disease 11/29/2020   Memory loss 10/27/2020   History of craniotomy 10/27/2020   Family history of GERD 10/27/2020   Hyperlipidemia 06/22/2020   Elevated liver enzymes 06/22/2020   History of hepatitis B virus infection 06/22/2020   Overweight with body mass index (BMI) of 27 to 27.9 in adult 06/22/2020   Hepatic steatosis 06/15/2020   Encounter for medical examination to establish care 06/14/2020   Type 2 diabetes mellitus without complication, without long-term current use of insulin ([place]) 06/14/2020   Essential hypertension 06/14/2020   Skin lesion 06/14/2020    Conditions to be addressed/monitored: Anxiety and Caregiver Stress.  Limited Social Support, Mental Health Concerns, Social Isolation, Limited Access to Caregiver, and Lacks Knowledge of [employer].  Care Plan : LCSW Plan of Care  Updates made by Francis Gaines, LCSW since 11/01/2021 12:00 AM     Problem: Find Caregiver Support and Counseling Services in My Community.   Priority: High     Long-Range Goal: Find Acupuncturist in My Community.   Start Date: 06/08/2021  Expected End Date: 02/05/2022  This Visit's Progress: On track  Recent Progress: On track  Priority: High  Note:   Current Barriers:   Acute Mental Health needs related to Anxiety and Caregiver Stress, requires Support, Education, Resources, Referrals and Care Coordination in order to meet unmet mental health needs. Clinical  Goal(s):  Patient will work with LCSW to reduce and manage symptoms of Anxiety and Caregiver Stress.     Clinical Interventions:  Active Listening utilized, Emotional Support provided, Cognitive Behavioral Therapy performed, Caregiver Stress acknowledged, Problem-Solving Solutions identified, and Verbalization of Feelings encouraged. Support and empathy provided when patient discussed the recent findings of her husband's terminal illness, with a prognosis of six months or less.      Collaboration with Primary Care Physician, Dr. [name] regarding development and update of comprehensive plan of care as evidenced by provider attestation and co-signature. Collaboration with Embedded Pharmacist at [facility], Alphonzo Severance to report recent denial letter for financial assistance, in response to renewal application for Ozempic, due to "missing documentation".  ~ The denial letter did not indicate what documentation was missing.  ~ Patient reported having enough Ozempic Injections left to last her until mid-March. Collaboration with Primary Care Physician, Dr. Sharyn Lull [name] to address specific question, "When will I be able  to blink again"? Patient Goals/Self-Care Activities: Continue to receive personal counseling with LCSW, on a monthly basis, to reduce and manage symptoms of Anxiety and Caregiver Stress, until manageable.  Continue to review information provided, as well as perform independent search, of benefits and services offered through hospice and palliative care.   ~  [place] [phone]) ~  [place] [phone]) ~  [place] [phone]) Please accept all calls from Alphonzo Severance, Embedded Pharmacist at [facility], in response to your denial letter to receive financial assistance for Ozempic. Contact LCSW  directly if you have questions, need assistance, or if additional social work needs are identified between now and our next scheduled telephone outreach call.   Follow-Up:  Scheduling Care Guide will schedule patient's follow-up telephone outreach call with Chrystal Land, newly Embedded LCSW at [facility]: [place] Rhonda W.     Nat Christen LCSW Licensed Clinical Social Worker [place]  [phone]

## 2021-11-01 NOTE — Telephone Encounter (Signed)
Patient reported to LCSW that she received letter from [employer] that her application for patient assistance required additional information. Notified patient that the letter was old, per CPhT call on 1/20, patient was approved through the end of the year. Reviewed that last order shipped went to processing on 10/11/21 and should arrive to the office within 30 days.

## 2021-11-03 ENCOUNTER — Telehealth: Payer: Self-pay

## 2021-11-03 NOTE — Chronic Care Management (AMB) (Signed)
°  Care Management   Note  11/03/2021 Name: Tiffany Gregory MRN: [id number] DOB: 08-16-1951  [name] is a 71 y.o. year old female who is a primary care patient of [name], [name], FNP and is actively engaged with the care management team. I reached out to [name] by phone today to assist with re-scheduling a follow up visit with the Licensed Clinical Social Worker  Follow up plan: Unsuccessful telephone outreach attempt made. A HIPAA compliant phone message was left for the patient providing contact information and requesting a return call.  The care management team will reach out to the patient again over the next 7 days.  If patient returns call to provider office, please advise to call [place]  at [place], [place], [place], [place] [address] Direct Dial: [phone] [email or web address]: [email or web address]

## 2021-11-07 DIAGNOSIS — I10 Essential (primary) hypertension: Secondary | ICD-10-CM

## 2021-11-07 DIAGNOSIS — E119 Type 2 diabetes mellitus without complications: Secondary | ICD-10-CM | POA: Diagnosis not present

## 2021-11-07 DIAGNOSIS — E1169 Type 2 diabetes mellitus with other specified complication: Secondary | ICD-10-CM

## 2021-11-07 DIAGNOSIS — E785 Hyperlipidemia, unspecified: Secondary | ICD-10-CM | POA: Diagnosis not present

## 2021-11-14 ENCOUNTER — Telehealth: Payer: Medicare Other

## 2021-11-27 ENCOUNTER — Telehealth: Payer: Medicare Other

## 2021-11-28 ENCOUNTER — Telehealth: Payer: Medicare Other

## 2021-12-04 ENCOUNTER — Telehealth: Payer: Medicare Other

## 2021-12-07 ENCOUNTER — Ambulatory Visit (INDEPENDENT_AMBULATORY_CARE_PROVIDER_SITE_OTHER): Payer: Medicare Other | Admitting: Pharmacist

## 2021-12-07 DIAGNOSIS — I10 Essential (primary) hypertension: Secondary | ICD-10-CM

## 2021-12-07 DIAGNOSIS — E1169 Type 2 diabetes mellitus with other specified complication: Secondary | ICD-10-CM

## 2021-12-07 DIAGNOSIS — E785 Hyperlipidemia, unspecified: Secondary | ICD-10-CM

## 2021-12-07 DIAGNOSIS — E119 Type 2 diabetes mellitus without complications: Secondary | ICD-10-CM

## 2021-12-07 MED ORDER — ATORVASTATIN CALCIUM 40 MG PO TABS: 40.0000 mg | ORAL_TABLET | Freq: Every day | ORAL | 3 refills | Status: DC

## 2021-12-07 NOTE — Chronic Care Management (AMB) (Signed)
? ?Chronic Care Management ?CCM Pharmacy Note ? ?12/07/2021 ?Name:  Minami Arriaga MRN:  [id number] DOB:  [birth year] ? ?Summary: ?- Tolerating current regimen at this time ?- LDL not at goal ? ?Recommendations/Changes made from today's visit: ?- Recommend to increase atorvastatin to 40 mg daily ? ?Subjective: ?Lagena Strand is an 71 y.o. year old female who is a primary patient of [name], [name], FNP.  The CCM team was consulted for assistance with disease management and care coordination needs.   ? ?Engaged with patient by telephone for follow up visit for pharmacy case management and/or care coordination services.  ? ?Objective: ? ?Medications Reviewed Today   ? ? Reviewed by [name], RPH-CPP (Pharmacist) on 12/07/21 at 1422  Med List Status: <None>  ? ?Medication Order Taking? Sig Documenting Provider Last Dose Status Informant  ?acetaminophen (TYLENOL) 325 MG tablet [id number] Yes Take 650 mg by mouth every 6 (six) hours as needed. [provider] Taking Active   ?aspirin EC 81 MG tablet [id number] Yes Take 81 mg by mouth daily. Swallow whole. [provider] Taking Active   ?atorvastatin (LIPITOR) 20 MG tablet [id number] Yes Take 1 tablet (20 mg total) by mouth daily. [name], [name], FNP Taking Active   ?Blood Glucose Monitoring Suppl (ONE TOUCH ULTRA 2) w/Device KIT [id number] Yes by Does not apply route. [provider] Taking Active   ?cyanocobalamin 1000 MCG tablet [id number] Yes Take 1 tablet by mouth daily. [provider] Taking Active   ?cyclobenzaprine (FLEXERIL) 10 MG tablet [id number] No Take 1 tablet (10 mg total) by mouth at bedtime.  ?Patient not taking: Reported on 12/07/2021  ? [name], [name], FNP Not Taking Active   ?glucose blood test strip [id number] Yes 1 each by Other route as needed for other. Use as instructed. One touch ultra blue in vitro strips [name], [name], FNP Taking Active   ?Patient not taking:  Discontinued 12/07/21  1422 (Completed Course)   ?         ?Med Note Nat Christen Dec 07, 2021  2:21 PM)    ?metFORMIN (GLUCOPHAGE-XR) 500 MG 24 hr tablet [id number] Yes TAKE 1 TABLET(500 MG) BY MOUTH DAILY WITH BREAKFAST [name], [name], FNP Taking Active   ?omeprazole (PRILOSEC) 20 MG capsule [id number]  Take 2 capsules (40 mg total) by mouth daily. [name], [name], FNP  Active   ?OneTouch Delica Lancets 25W MISC [id number] Yes by Does not apply route. [provider] Taking Active   ?polyethylene glycol (MIRALAX / GLYCOLAX) 17 g packet [id number] Yes Take 17 g by mouth daily. [provider] Taking Active   ?ramipril (ALTACE) 10 MG capsule [id number] Yes Take 2 capsules (20 mg total) by mouth daily. [name], MD Taking Active   ?Semaglutide, 1 MG/DOSE, (OZEMPIC, 1 MG/DOSE,) 4 MG/3ML SOPN [id number] Yes Inject 1 mg into the skin once a week. [name], MD Taking Active   ? ?  ?  ? ?  ? ? ?Pertinent Labs:   ?Lab Results  ?Component Value Date  ? HGBA1C 6.4 08/15/2021  ? ?Lab Results  ?Component Value Date  ? CHOL 182 08/15/2021  ? HDL 37.90 (L) 08/15/2021  ? [place] 74 10/27/2020  ? LDLDIRECT 108.0 08/15/2021  ? TRIG 229.0 (H) 08/15/2021  ? CHOLHDL 5 08/15/2021  ? ?Lab Results  ?Component Value Date  ? CREATININE 0.84 08/29/2021  ? BUN 14 08/29/2021  ? NA 135 08/29/2021  ? K 3.7  08/29/2021  ? CL 98 08/29/2021  ? CO2 29 08/29/2021  ? ? ?SDOH:  (Social Determinants of Health) assessments and interventions performed:  ?SDOH Interventions   ? ?Flowsheet Row Most Recent Value  ?SDOH Interventions   ?Financial Strain Interventions Other (Comment)  [manufacturer assistance]  ? ?  ? ? ?[place] ? ?Review of patient past medical history, allergies, medications, health status, including review of consultants reports, laboratory and other test data, was performed as part of comprehensive evaluation and provision of chronic care management services.  ? ?Care Plan : Medication Management  ?Updates  made by [name], RPH-CPP since 12/07/2021 12:00 AM  ?  ? ?Problem: Diabetes, Hypertension   ?  ? ?Long-Range Goal: Disease Progression Prevention   ?Start Date: 07/13/2020  ?Recent Progress: On track  ?Priority: High  ?Note:   ?Current Barriers:  ?Concerns regarding her ability to independently afford treatment regimen ?Unable to achieve control of diabetes  ?Complex medical history including hx hepatitis B, history of intracranial hemorrhage  ? ?Pharmacist Clinical Goal(s):  ?Over the next 90 days, patient will verbalize ability to afford treatment regimen through collaboration with PharmD and provider. ?Over the next 90 days, patient will achieve control of diabetes as evidenced by improvement in A1c through collaboration with PharmD and provider ? ? ?Interventions: ?1:1 collaboration with [name], [name], FNP regarding development and update of comprehensive plan of care as evidenced by provider attestation and co-signature ?Inter-disciplinary care team collaboration (see longitudinal plan of care) ?Comprehensive medication review performed; medication list updated in electronic medical record ? ?Health Maintenance   ?Yearly diabetic eye exam: up to date ?Yearly diabetic foot exam: due ?Urine microalbumin: up to date ?Yearly influenza vaccination: due ?Td/Tdap vaccination: due ?Pneumonia vaccination: due ?COVID vaccinations: due ?Shingrix vaccinations: due ?Colonoscopy: due ?Bone density scan: due ?Mammogram: due ? ?Diabetes: ?Controlled; current treatment: metformin XR 500 mg QAM, Ozempic 1 mg weekly ?Approved for Ozempic assistance through [employer] through 2023 ?Hx glimepiride (d/c d/t hypoglycemia)  ?Current glucose readings: fasting: 89-120s; post prandial: generally <180 except if a large serving of sweets ?Recommended to continue current regimen at this time ? ?Hypertension: ?Controlled per last office visit; current treatment: ramipril 20 mg daily ?Previously on HCTZ, d/c d/t  hyponatremia ?Previously recommended to continue current regimen ? ?Hyperlipidemia and ASCVD risk reduction: ?Uncontrolled; current treatment: atorvastatin 20 mg daily  ?Antiplatelet therapy: aspirin 81 mg daily  ?Recommended to increase atorvastatin to 40 mg daily  ? ?Hx esophageal stricture (per patient report): ?Controlled per patient report; current regimen: omeprazole 20 mg daily;  ?Reports hx of esophageal stricture/narrowing requiring stretching, was advised to stay on PPI therapy for life ?Previously recommended to continue current regimen ? ?Arthritis: ?Moderately well controlled per patient report; current regimen: meloxicam 7.5 mg QAM (prescribed BID but only remembering to take QAM); acetaminophen 325 mg PRN, ?Previously reviewed to avoid combining ibuprofen and meloxicam due to long term renal, CV, GI effects of NSAID therapy. ?Previously recommended to continue current regimen  ? ?Supplements: ?Vitamin B12, Vitamin E  ? ?Patient Goals/Self-Care Activities ?Over the next 90 days, patient will:  ?- take medications as prescribed ?check blood glucose twice daily, document, and provide at future appointments ?  ?  ? ?Plan: Telephone follow up appointment with care management team member scheduled for:  3 months ? ?Catie Darnelle Maffucci, PharmD, BCACP, CPP ?Clinical Pharmacist ?Therapist, music at [facility] ?[phone] ? ? ? ? ? ?

## 2021-12-07 NOTE — Patient Instructions (Signed)
Tiffany Gregory,  ? ?It was great talking to you today! ? ?The [employer] Patient Assistance Program for [employer] has an auto-refill program where you do not need to call and request a refill each time. You should receive your medication shipment before you run out of your medication. However, if you have any issues or need assistance, you can call them at [phone]. They are available Monday though Friday 8:00 AM - 8:00 PM.  ? ?Increase atorvastatin to 40 mg daily. You can take 2 of the 20 mg tablets you currently have, and then fill the new prescription for the 40 mg tablet strength.  ? ?Take care! ? ?Catie Darnelle Maffucci, PharmD ? ?Visit Information ? ?Following are the goals we discussed today:  ?Patient Goals/Self-Care Activities ?Over the next 90 days, patient will:  ?- take medications as prescribed ?check blood glucose twice daily, document, and provide at future appointments ?   ?  ?  ?Plan: Telephone follow up appointment with care management team member scheduled for:  3 months ?  ?Catie Darnelle Maffucci, PharmD, BCACP, CPP ?Clinical Pharmacist ?Therapist, music at [facility] ?[phone] ? ?Please call the care guide team at [phone] if you need to cancel or reschedule your appointment.  ? ?Patient verbalizes understanding of instructions and care plan provided today and agrees to view in [place]. Active MyChart status confirmed with patient.   ? ?

## 2021-12-13 ENCOUNTER — Ambulatory Visit: Payer: Self-pay | Admitting: Pharmacist

## 2021-12-13 NOTE — Patient Instructions (Signed)
Hi Trisa,  ? ?I am being asked to quickly transition into another role within the health system, so unfortunately I am unable to keep our next appointment. Please continue to follow up with the social worker and your primary care provider as scheduled.  ? ?As a reminder - [employer] Patient Assistance Program has an auto-refill program where you do not need to call and request a refill each time. You should receive your medication shipment before you run out of your medication. However, if you have any issues or need assistance, you can call them at [phone]. They are available Monday though Friday 8:00 AM - 8:00 PM.  ? ?It has been a pleasure working with you! ? ?Catie Darnelle Maffucci, PharmD ? ?

## 2021-12-13 NOTE — Chronic Care Management (AMB) (Signed)
?  Chronic Care Management  ? ?Note ? ?12/13/2021 ?Name: Tiffany Gregory MRN: [id number] DOB: Feb 20, 1951 ? ? ? ?Closing pharmacy CCM case at this time. Follow up with LCSW. Patient has clinic contact information for future questions or concerns.  ? ?Catie Darnelle Maffucci, PharmD, [place], CPP ?Clinical Pharmacist ?Therapist, music at [facility] ?[phone] ? ?

## 2021-12-21 ENCOUNTER — Encounter: Payer: Medicare Other | Admitting: Dermatology

## 2021-12-22 NOTE — Chronic Care Management (AMB) (Signed)
?  Care Management  ? ?Note ? ?12/22/2021 ?Name: Tiffany Gregory MRN: [id number] DOB: 09/26/1951 ? ?Tiffany Gregory is a 71 y.o. year old female who is a primary care patient of [name], [name], FNP and is actively engaged with the care management team. I reached out to [name] by phone today to assist with re-scheduling a follow up visit with the Licensed Clinical Social Worker ? ?Follow up plan: ?Telephone appointment with care management team member scheduled for:01/03/2022 ? ?[name], RMA ?Care Guide, Embedded Care Coordination ?[facility]  ?[place], [place] [address] ?Direct Dial: [phone] ?[email or web address] ?Website: [email or web address]  ? ?

## 2021-12-27 ENCOUNTER — Other Ambulatory Visit: Payer: Self-pay

## 2021-12-27 DIAGNOSIS — I10 Essential (primary) hypertension: Secondary | ICD-10-CM

## 2021-12-27 MED ORDER — RAMIPRIL 10 MG PO CAPS: 20.0000 mg | ORAL_CAPSULE | Freq: Every day | ORAL | 1 refills | Status: DC

## 2022-01-01 ENCOUNTER — Encounter: Payer: Self-pay | Admitting: Adult Health

## 2022-01-03 ENCOUNTER — Ambulatory Visit: Payer: Medicare Other | Admitting: *Deleted

## 2022-01-03 DIAGNOSIS — I10 Essential (primary) hypertension: Secondary | ICD-10-CM

## 2022-01-03 DIAGNOSIS — E119 Type 2 diabetes mellitus without complications: Secondary | ICD-10-CM

## 2022-01-03 NOTE — Patient Instructions (Signed)
Visit Information ? ?Thank you for taking time to visit with me today. Please don't hesitate to contact me if I can be of assistance to you before our next scheduled telephone appointment. ?Continue to receive supportive counseling/emotional support, on a monthly basis, to reduce and manage symptoms of Anxiety and Caregiver Stress, until manageable.  ?Continue to review information provided, as well as perform independent search, of benefits and services offered through hospice and palliative care.   ?~  [place] [phone]) ?~  [place] [phone]) ?~  [place] [phone]) ?Contact LCSW directly if you have questions, need assistance, or if additional social work needs are identified between now and our next scheduled telephone outreach call.   ? ? ?Our next appointment is by telephone on 01/31/22 at 1pm ? ?Please call the care guide team at [phone] if you need to cancel or reschedule your appointment.  ? ?If you are experiencing a Mental Health or [place] or need someone to talk to, please call the Suicide and Crisis Lifeline: 988  ? ?Patient verbalizes understanding of instructions and care plan provided today and agrees to view in [place]. Active MyChart status confirmed with patient.   ? ?Telephone follow up appointment with care management team member scheduled for:01/31/22 ?Alicianna Litchford, LCSW ?[place] ?[phone] ? ?

## 2022-01-03 NOTE — Chronic Care Management (AMB) (Signed)
?Chronic Care Management  ? ? Clinical Social Work Note ? ?01/03/2022 ?Name: Tiffany Gregory MRN: [id number] DOB: 1951-07-15 ? ?Tiffany Gregory is a 71 y.o. year old female who is a primary care patient of [name], [name], FNP. The CCM team was consulted to assist the patient with chronic disease management and/or care coordination needs related to: Mental Health Counseling and Resources.  ? ?Engaged with patient by telephone for follow up visit in response to provider referral for social work chronic care management and care coordination services.  ? ?Consent to Services:  ?The patient was given information about Chronic Care Management services, agreed to services, and gave verbal consent prior to initiation of services.  Please see initial visit note for detailed documentation.  ? ?Patient agreed to services and consent obtained.  ? ?Assessment: Review of patient past medical history, allergies, medications, and health status, including review of relevant consultants reports was performed today as part of a comprehensive evaluation and provision of chronic care management and care coordination services.    ? ?SDOH (Social Determinants of Health) assessments and interventions performed:   ? ?Advanced Directives Status: Not addressed in this encounter. ? ?CCM Care Plan ? ?Allergies  ?Allergen Reactions  ? Lemon Flavor Anaphylaxis  ?  "my throat closes up"  ? Keflex [Cephalexin] Itching  ?  Itching and hives no anaphylaxis.   ? [name] Other (See Comments)  ? Sulfa Antibiotics Other (See Comments)  ?  Other reaction(s): Unknown  ? ? ?Outpatient Encounter Medications as of 01/03/2022  ?Medication Sig  ? acetaminophen (TYLENOL) 325 MG tablet Take 650 mg by mouth every 6 (six) hours as needed.  ? aspirin EC 81 MG tablet Take 81 mg by mouth daily. Swallow whole.  ? atorvastatin (LIPITOR) 40 MG tablet Take 1 tablet (40 mg total) by mouth daily.  ? Blood Glucose Monitoring Suppl (ONE TOUCH ULTRA 2) w/Device  KIT by Does not apply route.  ? cyanocobalamin 1000 MCG tablet Take 1 tablet by mouth daily.  ? cyclobenzaprine (FLEXERIL) 10 MG tablet Take 1 tablet (10 mg total) by mouth at bedtime. (Patient not taking: Reported on 12/07/2021)  ? glucose blood test strip 1 each by Other route as needed for other. Use as instructed. One touch ultra blue in vitro strips  ? metFORMIN (GLUCOPHAGE-XR) 500 MG 24 hr tablet TAKE 1 TABLET(500 MG) BY MOUTH DAILY WITH BREAKFAST  ? omeprazole (PRILOSEC) 20 MG capsule Take 2 capsules (40 mg total) by mouth daily.  ? OneTouch Delica Lancets 43P MISC by Does not apply route.  ? polyethylene glycol (MIRALAX / GLYCOLAX) 17 g packet Take 17 g by mouth daily.  ? ramipril (ALTACE) 10 MG capsule Take 2 capsules (20 mg total) by mouth daily.  ? Semaglutide, 1 MG/DOSE, (OZEMPIC, 1 MG/DOSE,) 4 MG/3ML SOPN Inject 1 mg into the skin once a week.  ? ?No facility-administered encounter medications on file as of 01/03/2022.  ? ? ?Patient Active Problem List  ? Diagnosis Date Noted  ? Fatigue due to exposure 08/29/2021  ? Hypokalemia 08/29/2021  ? Nerve pain 08/29/2021  ? History of leukocytosis 08/22/2021  ? Non-recurrent acute serous otitis media of left ear 08/22/2021  ? Balance problem 08/22/2021  ? Bell's palsy 08/12/2021  ? Nasal congestion 02/14/2021  ? Spasm of cervical paraspinous muscle 01/24/2021  ? B12 deficiency 01/24/2021  ? Neck pain 01/24/2021  ? Fall 01/24/2021  ? Gastroesophageal reflux disease 11/29/2020  ? Memory loss 10/27/2020  ? History of craniotomy  10/27/2020  ? Family history of GERD 10/27/2020  ? Hyperlipidemia 06/22/2020  ? Elevated liver enzymes 06/22/2020  ? History of hepatitis B virus infection 06/22/2020  ? Overweight with body mass index (BMI) of 27 to 27.9 in adult 06/22/2020  ? Hepatic steatosis 06/15/2020  ? Encounter for medical examination to establish care 06/14/2020  ? Type 2 diabetes mellitus without complication, without long-term current use of insulin ([place]) 06/14/2020   ? Essential hypertension 06/14/2020  ? Skin lesion 06/14/2020  ? ? ?Conditions to be addressed/monitored: ;  ? Anxiety and Caregiver Stress.  Limited Social Support, Mental Health Concerns, Social Isolation, Limited Access to Caregiver, and Lacks Knowledge of [employer]. ?  ?Care Plan : LCSW Plan of Care  ?Updates made by Vern Claude, LCSW since 01/03/2022 12:00 AM  ?  ? ?Problem: Find Multimedia programmer and Counseling Services in My Community.   ?Priority: High  ?  ? ?Long-Range Goal: Find Acupuncturist in My Community.   ?Start Date: 06/08/2021  ?Expected End Date: 02/05/2022  ?This Visit's Progress: On track  ?Recent Progress: On track  ?Priority: High  ?Note:   ?Current Barriers:   ?Acute Mental Health needs related to Anxiety and Caregiver Stress, requires Support, Education, Resources, Referrals and Care Coordination in order to meet unmet mental health needs. ?Clinical Goal(s):  ?Patient will work with LCSW to reduce and manage symptoms of Anxiety and Caregiver Stress.     ?Clinical Interventions:  ?Active Listening utilized, Emotional Support provided, Caregiver Stress acknowledged, Problem-Solving Solutions identified, and Verbalization of Feelings encouraged. ?Support and empathy provided when patient discussed the recent findings of her husband's terminal illness ?Network of support explored, patient's son planning visit soon from [place] ?Hospice referral discussed, patient realistic in her expectations related to spouses illness and will plan to contact a hospice agency of choice for referral   ?Self care emphasized, quality of life explored in regards to consideration of hospice ?Collaboration with Primary Care Physician, Dr. [name] regarding development and update of comprehensive plan of care as evidenced by provider attestation and co-signature. ?Patient Goals/Self-Care Activities: ?Continue to receive supportive counseling/emotional support with LCSW,  on a monthly basis, to reduce and manage symptoms of Anxiety and Caregiver Stress, until manageable.  ?Continue to review information provided, as well as perform independent search, of benefits and services offered through hospice and palliative care.   ?~  [place] [phone]) ?~  [place] [phone]) ?~  [place] [phone]) ?Please accept all calls from Alphonzo Severance, Embedded Pharmacist at [facility], in response to your denial letter to receive financial assistance for Ozempic. ?Contact LCSW directly if you have questions, need assistance, or if additional social work needs are identified between now and our next scheduled telephone outreach call.   ? ? ?  ?  ? ?Follow Up Plan: SW will follow up with patient by phone over the next 30 business days ?     ?Tiffany Verhagen, LCSW ?[place] ?[phone]  ? ? ?

## 2022-01-05 DIAGNOSIS — I10 Essential (primary) hypertension: Secondary | ICD-10-CM

## 2022-01-05 DIAGNOSIS — E1169 Type 2 diabetes mellitus with other specified complication: Secondary | ICD-10-CM | POA: Diagnosis not present

## 2022-01-05 DIAGNOSIS — E785 Hyperlipidemia, unspecified: Secondary | ICD-10-CM

## 2022-01-05 DIAGNOSIS — Z7985 Long-term (current) use of injectable non-insulin antidiabetic drugs: Secondary | ICD-10-CM

## 2022-01-10 ENCOUNTER — Telehealth: Payer: Self-pay

## 2022-01-10 NOTE — Telephone Encounter (Signed)
Pt advised Ozempic here from patient assistance. ?Ready for pick up. ?

## 2022-01-11 NOTE — Telephone Encounter (Signed)
Patient came by and picked up medication this morning.   ?

## 2022-01-24 ENCOUNTER — Encounter: Payer: Medicare Other | Admitting: Dermatology

## 2022-01-31 ENCOUNTER — Encounter: Payer: Self-pay | Admitting: Family

## 2022-01-31 ENCOUNTER — Ambulatory Visit (INDEPENDENT_AMBULATORY_CARE_PROVIDER_SITE_OTHER): Payer: Medicare Other | Admitting: *Deleted

## 2022-01-31 ENCOUNTER — Ambulatory Visit (INDEPENDENT_AMBULATORY_CARE_PROVIDER_SITE_OTHER): Payer: Medicare Other | Admitting: Family

## 2022-01-31 VITALS — BP 138/78 | HR 82 | Temp 98.0°F | Ht 64.0 in | Wt 142.8 lb

## 2022-01-31 DIAGNOSIS — E119 Type 2 diabetes mellitus without complications: Secondary | ICD-10-CM

## 2022-01-31 DIAGNOSIS — I10 Essential (primary) hypertension: Secondary | ICD-10-CM | POA: Diagnosis not present

## 2022-01-31 DIAGNOSIS — E785 Hyperlipidemia, unspecified: Secondary | ICD-10-CM | POA: Diagnosis not present

## 2022-01-31 LAB — POCT GLYCOSYLATED HEMOGLOBIN (HGB A1C): Hemoglobin A1C: 5.7 % — AB (ref 4.0–5.6)

## 2022-01-31 MED ORDER — VALSARTAN 80 MG PO TABS: 80.0000 mg | ORAL_TABLET | Freq: Every day | ORAL | 3 refills | Status: DC

## 2022-01-31 NOTE — Assessment & Plan Note (Signed)
Elevated, prior BP appear elevated as well. Advised to stop ramipril '20mg'$  and start valsartan '80mg'$  for better blood pressure control. CMP in one week.  ?

## 2022-01-31 NOTE — Assessment & Plan Note (Signed)
Lab Results  ?Component Value Date  ? HGBA1C 5.7 (A) 01/31/2022  ?Excellent control. Patient prefers to continue ozempic '1mg'$ , metformin '500mg'$  as she has been pleased with medication. We may opt to decrease an agent in the future if a1c remains stable. ?

## 2022-01-31 NOTE — Patient Instructions (Addendum)
Stop ramipril  ?We will instead use valsartan which is better with blood pressure control while protecting your kidneys ?It is imperative that you are seen AT least twice per year for labs and monitoring. Monitor blood pressure at home and me 5-6 reading on separate days. Goal is less than 120/80, based on newest guidelines, however we certainly want to be less than 130/80;  if persistently higher, please make sooner follow up appointment so we can recheck you blood pressure and manage/ adjust medications. ? ?Nice to meet you! ? ?

## 2022-01-31 NOTE — Progress Notes (Signed)
? ?Subjective:  ? ? Patient ID: Tiffany Gregory, female    DOB: 01/03/1951, 71 y.o.   MRN: [id number] ? ?CC: Tiffany Gregory is a 71 y.o. female who presents today to establish care and transfer of care ? ?HPI: Feels well today  ?No new complaints ? ?HTN- compliant with ramipril 43m qd. No cp, sob ? 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 She stays active doing doing housework and yardwork.  ? ?DM- compliant ozempic 147m metformin 50039mTolerating medication and pleased with overall weight loss. No numbness in feet.  ? ?HLD- increased lipitor to 43m43mst month.  ? ?HISTORY:  ?Past Medical History:  ?Diagnosis Date  ? Allergy   ? Asthma   ? Diabetes mellitus without complication (HCC)[place]? Elevated liver enzymes   ? Hepatic steatosis   ? History of hepatitis B 1971  ? She had IV drug use experimentation and was hosptialized and reports treated.   ? Hyperlipidemia   ? Hypertension   ? ?Past Surgical History:  ?Procedure Laterality Date  ? ABDOMINAL HYSTERECTOMY    ? BREAST BIOPSY    ? CRANIECTOMY    ? NASAL FRACTURE SURGERY  1975  ? NASAL SINUS SURGERY  2010  ? ?Family History  ?Problem Relation Age of Onset  ? Diabetes Mother   ? Heart attack Mother   ? Diabetes Father   ? Heart disease Father   ? Hypertension Father   ? Diabetes Sister   ? COPD Sister   ? Heart disease Sister   ? Diabetes Brother   ? Heart attack Maternal Grandmother   ? Cancer Maternal Grandfather   ? Heart attack Paternal Grandmother   ? Cancer Paternal Grandfather   ? Heart attack Brother   ? Heart disease Brother   ? ? ?Allergies: Lemon flavor, Keflex [cephalexin], [name], and Sulfa antibiotics ?Current Outpatient Medications on File Prior to Visit  ?Medication Sig Dispense Refill  ? acetaminophen (TYLENOL) 325 MG tablet Take 650 mg by mouth every 6 (six) hours as needed.    ? aspirin EC 81 MG tablet Take 81 mg by mouth daily. Swallow whole.    ? atorvastatin (LIPITOR) 40 MG  tablet Take 1 tablet (40 mg total) by mouth daily. 90 tablet 3  ? Blood Glucose Monitoring Suppl (ONE TOUCH ULTRA 2) w/Device KIT by Does not apply route.    ? cyclobenzaprine (FLEXERIL) 10 MG tablet Take 1 tablet (10 mg total) by mouth at bedtime. 30 tablet 0  ? glucose blood test strip 1 each by Other route as needed for other. Use as instructed. One touch ultra blue in vitro strips 100 each 3  ? metFORMIN (GLUCOPHAGE-XR) 500 MG 24 hr tablet TAKE 1 TABLET(500 MG) BY MOUTH DAILY WITH BREAKFAST 90 tablet 1  ? omeprazole (PRILOSEC) 20 MG capsule Take 2 capsules (40 mg total) by mouth daily. 180 capsule 0  ? OneTouch Delica Lancets 33G 70WC by Does not apply route.    ? polyethylene glycol (MIRALAX / GLYCOLAX) 17 g packet Take 17 g by mouth daily.    ? Semaglutide, 1 MG/DOSE, (OZEMPIC, 1 MG/DOSE,) 4 MG/3ML SOPN Inject 1 mg into the skin once a week. 3 mL 0  ? cyanocobalamin 1000 MCG tablet Take 1 tablet by mouth daily. (Patient not taking: Reported on 01/31/2022)    ? ?No current facility-administered medications on file prior to visit.  ? ? ?Social History  ? ?  Tobacco Use  ? Smoking status: Never  ?  Passive exposure: Past  ? Smokeless tobacco: Never  ?Vaping Use  ? Vaping Use: Never used  ?Substance Use Topics  ? Alcohol use: Not Currently  ? Drug use: Never  ? ? ?Review of Systems  ?Constitutional:  Negative for chills and fever.  ?Respiratory:  Negative for cough.   ?Cardiovascular:  Negative for chest pain and palpitations.  ?Gastrointestinal:  Negative for nausea and vomiting.  ?Neurological:  Negative for numbness.  ?   ?Objective:  ?  ?BP 138/78 (BP Location: Left Arm, Patient Position: Sitting, Cuff Size: Normal)   Pulse 82   Temp 98 ?F (36.7 ?C) (Oral)   Ht 5' 4"  (1.626 m)   Wt 142 lb 12.8 oz (64.8 kg)   SpO2 99%   BMI 24.51 kg/m?  ?BP Readings from Last 3 Encounters:  ?01/31/22 138/78  ?09/25/21 138/86  ?08/29/21 138/86  ? ?Wt Readings from Last 3 Encounters:  ?01/31/22 142 lb 12.8 oz (64.8 kg)   ?09/25/21 146 lb 12.8 oz (66.6 kg)  ?08/29/21 145 lb 12.8 oz (66.1 kg)  ? ? ?Physical Exam ?Vitals reviewed.  ?Constitutional:   ?   Appearance: She is well-developed.  ?Eyes:  ?   Conjunctiva/sclera: Conjunctivae normal.  ?Cardiovascular:  ?   Rate and Rhythm: Normal rate and regular rhythm.  ?   Pulses: Normal pulses.  ?   Heart sounds: Normal heart sounds.  ?Pulmonary:  ?   Effort: Pulmonary effort is normal.  ?   Breath sounds: Normal breath sounds. No wheezing, rhonchi or rales.  ?Skin: ?   General: Skin is warm and dry.  ?Neurological:  ?   Mental Status: She is alert.  ?Psychiatric:     ?   Speech: Speech normal.     ?   Behavior: Behavior normal.     ?   Thought Content: Thought content normal.  ? ? ?   ?Assessment & Plan:  ? ?Problem List Items Addressed This Visit   ? ?  ? Cardiovascular and Mediastinum  ? Essential hypertension - Primary  ?  Elevated, prior BP appear elevated as well. Advised to stop ramipril 67m and start valsartan 864mfor better blood pressure control. CMP in one week.  ? ?  ?  ? Relevant Medications  ? valsartan (DIOVAN) 80 MG tablet  ? Other Relevant Orders  ? POCT HgB A1C (Completed)  ? Comprehensive metabolic panel  ?  ? Endocrine  ? Type 2 diabetes mellitus without complication, without long-term current use of insulin ([facility] ?  Lab Results  ?Component Value Date  ? HGBA1C 5.7 (A) 01/31/2022  ?Excellent control. Patient prefers to continue ozempic 15m615mmetformin 500m32m she has been pleased with medication. We may opt to decrease an agent in the future if a1c remains stable. ?  ?  ? Relevant Medications  ? valsartan (DIOVAN) 80 MG tablet  ? Other Relevant Orders  ? Comprehensive metabolic panel  ? Lipid panel  ? Microalbumin / creatinine urine ratio  ?  ? Other  ? Hyperlipidemia  ?  Pending lipid panel. Anticipate improvement on increase dose of lipitor 40mg32m ?  ?  ? Relevant Medications  ? valsartan (DIOVAN) 80 MG tablet  ? ? ? ?I have discontinued DonnaMarquisawell's  ramipril. I am also having her start on valsartan. Additionally, I am having her maintain her aspirin EC, ONE TOUCH ULTRA 2, OneTouch Delica Lancets 33G, 09Fyethylene glycol,  cyanocobalamin, acetaminophen, Ozempic (1 MG/DOSE), glucose blood, cyclobenzaprine, omeprazole, metFORMIN, and atorvastatin. ? ? ?Meds ordered this encounter  ?Medications  ? valsartan (DIOVAN) 80 MG tablet  ?  Sig: Take 1 tablet (80 mg total) by mouth daily.  ?  Dispense:  90 tablet  ?  Refill:  3  ?  Order Specific Question:   Supervising Provider  ?  Answer:   [name] [[id number]]  ? ? ?Return precautions given.  ? ?Risks, benefits, and alternatives of the medications and treatment plan prescribed today were discussed, and patient expressed understanding.  ? ?Education regarding symptom management and diagnosis given to patient on AVS. ? ?Continue to follow with Burnard Hawthorne, FNP for routine health maintenance.  ? ?[name] and I agreed with plan.  ? ?Mable Paris, FNP ? ? ? ?

## 2022-01-31 NOTE — Assessment & Plan Note (Signed)
Pending lipid panel. Anticipate improvement on increase dose of lipitor '40mg'$ .  ?

## 2022-02-01 NOTE — Chronic Care Management (AMB) (Signed)
?Chronic Care Management  ? ? Clinical Social Work Note ? ?02/01/2022 ?Name: Lameisha Schuenemann MRN: [id number] DOB: 03-Dec-1950 ? ?Brailyn Killion is a 71 y.o. year old female who is a primary care patient of Burnard Hawthorne, FNP. The CCM team was consulted to assist the patient with chronic disease management and/or care coordination needs related to: Mental Health Counseling and Resources. ? ?Engaged with patient by telephone for follow up visit in response to provider referral for social work chronic care management and care coordination services.  ? ?Consent to Services:  ?The patient was given information about Chronic Care Management services, agreed to services, and gave verbal consent prior to initiation of services.  Please see initial visit note for detailed documentation.  ? ?Patient agreed to services and consent obtained.  ? ?Assessment: Review of patient past medical history, allergies, medications, and health status, including review of relevant consultants reports was performed today as part of a comprehensive evaluation and provision of chronic care management and care coordination services.    ? ?SDOH (Social Determinants of Health) assessments and interventions performed:   ? ?Advanced Directives Status: Not addressed in this encounter. ? ?CCM Care Plan ? ?Allergies  ?Allergen Reactions  ? Lemon Flavor Anaphylaxis  ?  "my throat closes up"  ? Keflex [Cephalexin] Itching  ?  Itching and hives no anaphylaxis.   ? [name] Other (See Comments)  ? Sulfa Antibiotics Other (See Comments)  ?  Other reaction(s): Unknown  ? ? ?Outpatient Encounter Medications as of 01/31/2022  ?Medication Sig  ? acetaminophen (TYLENOL) 325 MG tablet Take 650 mg by mouth every 6 (six) hours as needed.  ? aspirin EC 81 MG tablet Take 81 mg by mouth daily. Swallow whole.  ? atorvastatin (LIPITOR) 40 MG tablet Take 1 tablet (40 mg total) by mouth daily.  ? Blood Glucose Monitoring Suppl (ONE TOUCH ULTRA 2) w/Device KIT  by Does not apply route.  ? cyanocobalamin 1000 MCG tablet Take 1 tablet by mouth daily. (Patient not taking: Reported on 01/31/2022)  ? cyclobenzaprine (FLEXERIL) 10 MG tablet Take 1 tablet (10 mg total) by mouth at bedtime.  ? glucose blood test strip 1 each by Other route as needed for other. Use as instructed. One touch ultra blue in vitro strips  ? metFORMIN (GLUCOPHAGE-XR) 500 MG 24 hr tablet TAKE 1 TABLET(500 MG) BY MOUTH DAILY WITH BREAKFAST  ? omeprazole (PRILOSEC) 20 MG capsule Take 2 capsules (40 mg total) by mouth daily.  ? OneTouch Delica Lancets 50V MISC by Does not apply route.  ? polyethylene glycol (MIRALAX / GLYCOLAX) 17 g packet Take 17 g by mouth daily.  ? Semaglutide, 1 MG/DOSE, (OZEMPIC, 1 MG/DOSE,) 4 MG/3ML SOPN Inject 1 mg into the skin once a week.  ? valsartan (DIOVAN) 80 MG tablet Take 1 tablet (80 mg total) by mouth daily.  ? [DISCONTINUED] ramipril (ALTACE) 10 MG capsule Take 2 capsules (20 mg total) by mouth daily.  ? ?No facility-administered encounter medications on file as of 01/31/2022.  ? ? ?Patient Active Problem List  ? Diagnosis Date Noted  ? Fatigue due to exposure 08/29/2021  ? Hypokalemia 08/29/2021  ? Nerve pain 08/29/2021  ? History of leukocytosis 08/22/2021  ? Non-recurrent acute serous otitis media of left ear 08/22/2021  ? Balance problem 08/22/2021  ? Bell's palsy 08/12/2021  ? Nasal congestion 02/14/2021  ? Spasm of cervical paraspinous muscle 01/24/2021  ? B12 deficiency 01/24/2021  ? Neck pain 01/24/2021  ? Fall 01/24/2021  ?  Gastroesophageal reflux disease 11/29/2020  ? Memory loss 10/27/2020  ? History of craniotomy 10/27/2020  ? Family history of GERD 10/27/2020  ? Hyperlipidemia 06/22/2020  ? Elevated liver enzymes 06/22/2020  ? History of hepatitis B virus infection 06/22/2020  ? Overweight with body mass index (BMI) of 27 to 27.9 in adult 06/22/2020  ? Hepatic steatosis 06/15/2020  ? Encounter for medical examination to establish care 06/14/2020  ? Type 2  diabetes mellitus without complication, without long-term current use of insulin ([place]) 06/14/2020  ? Essential hypertension 06/14/2020  ? Skin lesion 06/14/2020  ? ? ?Conditions to be addressed/monitored:  ?Anxiety and Caregiver Stress.  Limited Social Support, Mental Health Concerns, Social Isolation, Limited Access to Caregiver, and Lacks Knowledge of [employer]. ?  ?Care Plan : LCSW Plan of Care  ?Updates made by Vern Claude, LCSW since 02/01/2022 12:00 AM  ?  ? ?Problem: Find Multimedia programmer and Counseling Services in My Community.   ?Priority: High  ?  ? ?Long-Range Goal: Find Acupuncturist in My Community.   ?Start Date: 06/08/2021  ?Expected End Date: 02/05/2022  ?Recent Progress: On track  ?Priority: High  ?Note:   ?Current Barriers:   ?Acute Mental Health needs related to Anxiety and Caregiver Stress, requires Support, Education, Resources, Referrals and Care Coordination in order to meet unmet mental health needs. ?Clinical Goal(s):  ?Patient will work with LCSW to reduce and manage symptoms of Anxiety and Caregiver Stress.     ?Clinical Interventions:  ?Active Listening utilized, Emotional Support provided, Caregiver Stress acknowledged, Problem-Solving Solutions identified, and Verbalization of Feelings encouraged. ?Support and empathy provided when patient discussed the recent findings of her husband's terminal illness ?Network of support explored, patient's son planning visit soon from [place] ?Hospice referral discussed, patient realistic in her expectations related to spouses illness and will plan to contact a hospice agency of choice for referral   ?Self care emphasized, quality of life explored in regards to consideration of hospice ?Collaboration with Primary Care Physician, Dr. [name] regarding development and update of comprehensive plan of care as evidenced by provider attestation and co-signature. ?01/31/22 Follow up phone call to patient to  continue to offer support related to her care giving duties. Per patient, she has noticed that her spouse seemed to lack energy and motivation.She is not sure of cause, however has discussed concerns with his provider. Phone call brief as patient received a call  that she needed to take. Will plan to call patient within the next few days to complete visit. ?Patient Goals/Self-Care Activities: ?Continue to receive supportive counseling/emotional support with LCSW, on a monthly basis, to reduce and manage symptoms of Anxiety and Caregiver Stress, until manageable.  ?Continue to review information provided, as well as perform independent search, of benefits and services offered through hospice and palliative care.   ?~  [place] [phone]) ?~  [place] [phone]) ?~  [place] [phone]) ?Please accept all calls from Alphonzo Severance, Embedded Pharmacist at [facility], in response to your denial letter to receive financial assistance for Ozempic. ?Contact LCSW directly if you have questions, need assistance, or if additional social work needs are identified between now and our next scheduled telephone outreach call.   ? ? ?  ?  ? ?Follow Up Plan: SW will follow up with patient by phone over the next 7-14 business days ?     ?  Cincere Zorn, LCSW ?[place] ?[phone] ? ? ? ?

## 2022-02-01 NOTE — Patient Instructions (Signed)
Visit Information ? ?Thank you for taking time to visit with me today. Please don't hesitate to contact me if I can be of assistance to you before our next scheduled telephone appointment. ? ?Following are the goals we discussed today:  ?Continue to receive supportive counseling/emotional support, on a monthly basis, to reduce and manage symptoms of Anxiety and Caregiver Stress, until manageable.  ?Continue to review information provided, as well as perform independent search, of benefits and services offered through hospice and palliative care.   ?~  [place] [phone]) ?~  [place] [phone]) ?~  [place] [phone]) ?Contact LCSW directly if you have questions, need assistance, or if additional social work needs are identified between now and our next scheduled telephone outreach call.   ? ?Our next appointment is by telephone on 02/07/22 at 10am ? ?Please call the care guide team at [phone] if you need to cancel or reschedule your appointment.  ? ?If you are experiencing a Mental Health or [place] or need someone to talk to, please call the Suicide and Crisis Lifeline: 988  ? ?Patient verbalizes understanding of instructions and care plan provided today and agrees to view in [place]. Active MyChart status confirmed with patient.   ? ?Telephone follow up appointment with care management team member scheduled for: 02/07/22 ? ?Krystalynn Ridgeway, LCSW ?[place] ?[phone] ? ?

## 2022-02-04 DIAGNOSIS — E119 Type 2 diabetes mellitus without complications: Secondary | ICD-10-CM

## 2022-02-04 DIAGNOSIS — I10 Essential (primary) hypertension: Secondary | ICD-10-CM

## 2022-02-07 ENCOUNTER — Encounter: Payer: Self-pay | Admitting: Family

## 2022-02-07 ENCOUNTER — Other Ambulatory Visit: Payer: Medicare Other

## 2022-02-09 ENCOUNTER — Other Ambulatory Visit: Payer: Self-pay

## 2022-02-09 ENCOUNTER — Telehealth: Payer: Medicare Other

## 2022-02-09 ENCOUNTER — Ambulatory Visit (INDEPENDENT_AMBULATORY_CARE_PROVIDER_SITE_OTHER): Payer: Medicare Other | Admitting: *Deleted

## 2022-02-09 DIAGNOSIS — I10 Essential (primary) hypertension: Secondary | ICD-10-CM

## 2022-02-09 DIAGNOSIS — E119 Type 2 diabetes mellitus without complications: Secondary | ICD-10-CM

## 2022-02-09 MED ORDER — AMLODIPINE BESYLATE 5 MG PO TABS: 5.0000 mg | ORAL_TABLET | Freq: Every day | ORAL | 3 refills | Status: DC

## 2022-02-09 NOTE — Telephone Encounter (Signed)
Spoke to patient and  went over note in detail. Patient stated that she is not having any difficulty swallowing or breathing at the present time, but if it gets to the point where she feels like she needs to go to UC then she will do so. Patient was agreeable to start back taking the Ramiril  '20mg'$ , and also with beginning the Amlodipine '5mg'$  for blood pressure.Patient stated that she will call back to make follow up appointment for recheck of BP due to personal reasons she could not commit at this time! Was able to go into the chart to discontinue the Valsartan and add it to her list of allergies , because it causes hives. ?

## 2022-02-09 NOTE — Chronic Care Management (AMB) (Signed)
?Chronic Care Management  ? ? Clinical Social Work Note ? ?02/09/2022 ?Name: Negar Sieler MRN: [id number] DOB: 1951-07-07 ? ?Mikel Pyon is a 71 y.o. year old female who is a primary care patient of Burnard Hawthorne, FNP. The CCM team was consulted to assist the patient with chronic disease management and/or care coordination needs related to: Mental Health Counseling and Resources.  ? ?Engaged with patient by telephone for follow up visit in response to provider referral for social work chronic care management and care coordination services.  ? ?Consent to Services:  ?The patient was given information about Chronic Care Management services, agreed to services, and gave verbal consent prior to initiation of services.  Please see initial visit note for detailed documentation.  ? ?Patient agreed to services and consent obtained.  ? ?Assessment: Review of patient past medical history, allergies, medications, and health status, including review of relevant consultants reports was performed today as part of a comprehensive evaluation and provision of chronic care management and care coordination services.    ? ?SDOH (Social Determinants of Health) assessments and interventions performed:   ? ?Advanced Directives Status: Not addressed in this encounter. ? ?CCM Care Plan ? ?Allergies  ?Allergen Reactions  ? Lemon Flavor Anaphylaxis  ?  "my throat closes up"  ? Keflex [Cephalexin] Itching  ?  Itching and hives no anaphylaxis.   ? [name] Other (See Comments)  ? Sulfa Antibiotics Other (See Comments)  ?  Other reaction(s): Unknown  ? Valsartan Hives  ? ? ?Outpatient Encounter Medications as of 02/09/2022  ?Medication Sig  ? acetaminophen (TYLENOL) 325 MG tablet Take 650 mg by mouth every 6 (six) hours as needed.  ? amLODipine (NORVASC) 5 MG tablet Take 1 tablet (5 mg total) by mouth daily.  ? aspirin EC 81 MG tablet Take 81 mg by mouth daily. Swallow whole.  ? atorvastatin (LIPITOR) 40 MG tablet Take 1 tablet  (40 mg total) by mouth daily.  ? Blood Glucose Monitoring Suppl (ONE TOUCH ULTRA 2) w/Device KIT by Does not apply route.  ? cyanocobalamin 1000 MCG tablet Take 1 tablet by mouth daily. (Patient not taking: Reported on 01/31/2022)  ? cyclobenzaprine (FLEXERIL) 10 MG tablet Take 1 tablet (10 mg total) by mouth at bedtime.  ? glucose blood test strip 1 each by Other route as needed for other. Use as instructed. One touch ultra blue in vitro strips  ? metFORMIN (GLUCOPHAGE-XR) 500 MG 24 hr tablet TAKE 1 TABLET(500 MG) BY MOUTH DAILY WITH BREAKFAST  ? omeprazole (PRILOSEC) 20 MG capsule Take 2 capsules (40 mg total) by mouth daily.  ? OneTouch Delica Lancets 27O MISC by Does not apply route.  ? polyethylene glycol (MIRALAX / GLYCOLAX) 17 g packet Take 17 g by mouth daily.  ? Semaglutide, 1 MG/DOSE, (OZEMPIC, 1 MG/DOSE,) 4 MG/3ML SOPN Inject 1 mg into the skin once a week.  ? ?No facility-administered encounter medications on file as of 02/09/2022.  ? ? ?Patient Active Problem List  ? Diagnosis Date Noted  ? Fatigue due to exposure 08/29/2021  ? Hypokalemia 08/29/2021  ? Nerve pain 08/29/2021  ? History of leukocytosis 08/22/2021  ? Non-recurrent acute serous otitis media of left ear 08/22/2021  ? Balance problem 08/22/2021  ? Bell's palsy 08/12/2021  ? Nasal congestion 02/14/2021  ? Spasm of cervical paraspinous muscle 01/24/2021  ? B12 deficiency 01/24/2021  ? Neck pain 01/24/2021  ? Fall 01/24/2021  ? Gastroesophageal reflux disease 11/29/2020  ? Memory loss 10/27/2020  ?  History of craniotomy 10/27/2020  ? Family history of GERD 10/27/2020  ? Hyperlipidemia 06/22/2020  ? Elevated liver enzymes 06/22/2020  ? History of hepatitis B virus infection 06/22/2020  ? Overweight with body mass index (BMI) of 27 to 27.9 in adult 06/22/2020  ? Hepatic steatosis 06/15/2020  ? Encounter for medical examination to establish care 06/14/2020  ? Type 2 diabetes mellitus without complication, without long-term current use of insulin  ([place]) 06/14/2020  ? Essential hypertension 06/14/2020  ? Skin lesion 06/14/2020  ? ? ?Conditions to be addressed/monitored:  ?Anxiety and Caregiver Stress.  Limited Social Support, Mental Health Concerns, Social Isolation, Limited Access to Caregiver, and Lacks Knowledge of [employer]. ?Care Plan : LCSW Plan of Care  ?Updates made by Vern Claude, LCSW since 02/09/2022 12:00 AM  ?  ? ?Problem: Find Multimedia programmer and Counseling Services in My Community.   ?Priority: High  ?  ? ?Long-Range Goal: Find Acupuncturist in My Community.   ?Start Date: 06/08/2021  ?Expected End Date: 02/05/2022  ?Recent Progress: On track  ?Priority: High  ?Note:   ?Current Barriers:   ?Acute Mental Health needs related to Anxiety and Caregiver Stress, requires Support, Education, Resources, Referrals and Care Coordination in order to meet unmet mental health needs. Patient's spouse has been hospitalized. ?Clinical Goal(s):  ?Patient will work with LCSW to reduce and manage symptoms of Anxiety and Caregiver Stress.     ?Clinical Interventions:  ?Active Listening utilized, Emotional Support provided, Caregiver Stress acknowledged, Verbalization of Feelings encouraged  ?Confirmed that patient's spouse now hospitalized, son planning visit soon from [place] ?Hospice referral discussed, patient realistic in her expectations related to spouses illness, will plan to take things "day by day" no discharge plan discussed at this time ?Self care explored and emphasized, patient able to verbalize self care practices utilized ?Patient to plan to work with the inpatient care management regarding discharge plan for spouse ?Patient Goals/Self-Care Activities: ?Continue to receive supportive counseling/emotional support with LCSW, on a monthly basis, to reduce and manage symptoms of Anxiety and Caregiver Stress, until manageable.  ?Contact LCSW directly if you have questions, need assistance, or if additional social  work needs are identified between now and our next scheduled telephone outreach call.   ? ? ?  ?  ? ?Follow Up Plan: SW will follow up with patient by phone over the next 30 business days ?     ?Andrey Mccaskill, LCSW ?[place] ?[phone] ? ? ? ?

## 2022-02-09 NOTE — Patient Instructions (Signed)
Visit Information ? ?Thank you for taking time to visit with me today. Please don't hesitate to contact me if I can be of assistance to you before our next scheduled telephone appointment. ? ?Following are the goals we discussed today:  ?Continue to receive supportive counseling/emotional support, on a monthly basis, to reduce and manage symptoms of Anxiety and Caregiver Stress, until manageable.  ?Contact LCSW directly if you have questions, need assistance, or if additional social work needs are identified between now and our next scheduled telephone outreach call.   ? ?Our next appointment is by telephone on 02/28/22 at 10am ? ?Please call the care guide team at [phone] if you need to cancel or reschedule your appointment.  ? ?If you are experiencing a Mental Health or [place] or need someone to talk to, please call the Suicide and Crisis Lifeline: 988  ? ?Patient verbalizes understanding of instructions and care plan provided today and agrees to view in [place]. Active MyChart status confirmed with patient.   ? ?Telephone follow up appointment with care management team member scheduled for: 02/28/22 ? ?Jannelle Notaro, LCSW ?[place] ?[phone] ? ?

## 2022-02-09 NOTE — Progress Notes (Signed)
Discontinued a medication ?

## 2022-02-19 IMAGING — MR MR HEAD WO/W CM
11 of 12 series · 42 of 48 positions shown · IV contrast (gadavist)
Comparison: MRI 10/28/2020.

CLINICAL DATA: Cranial neuropathy (CN 7) bells palsy - ct was
negative, [DATE] needs MRI to rule out stroke, also history of balance
issues. history of craniotomy in 1511

EXAM:
MRI HEAD WITHOUT AND WITH CONTRAST
TECHNIQUE: Multiplanar, multiecho pulse sequences of the brain and surrounding
structures were obtained without and with intravenous contrast.
CONTRAST:  6mL GADAVIST GADOBUTROL 1 MMOL/ML IV SOLN

[Series 2: DWI · axial · 3.0mm · 1.46mm/px · z∈[+1,+159]mm · 11 of 110 slices shown (1 of 2)]
[im 1/110]
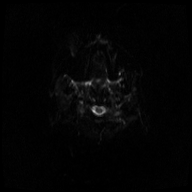
[im 11/110]
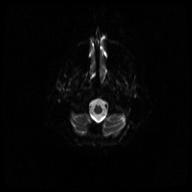
[im 22/110]
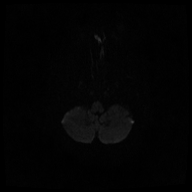
[im 33/110]
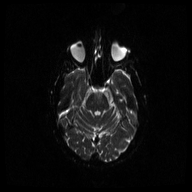
[im 44/110]
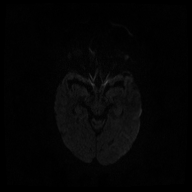
[im 55/110]
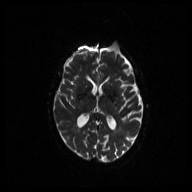
[im 66/110]
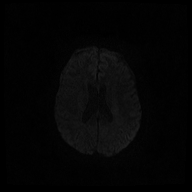
[im 77/110]
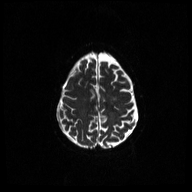
[im 88/110]
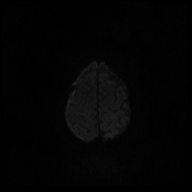
[im 99/110]
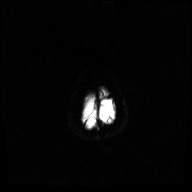
[im 110/110]
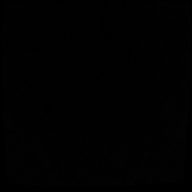

[Series 3: DWI · axial · 3.0mm · 1.46mm/px · z∈[+1,+159]mm · 6 of 54 slices shown (2 of 2)]
[im 1/54]
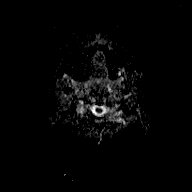
[im 11/54]
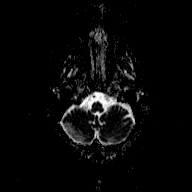
[im 22/54]
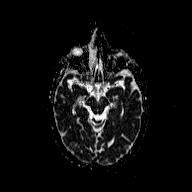
[im 32/54]
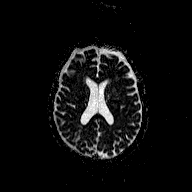
[im 43/54]
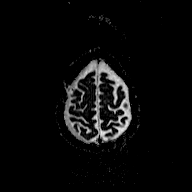
[im 54/54]
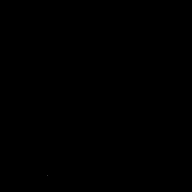

[Series 4: T1 · sagittal · 5.0mm · 0.45mm/px · 2 of 23 slices shown (1 of 3)]
[im 1/23]
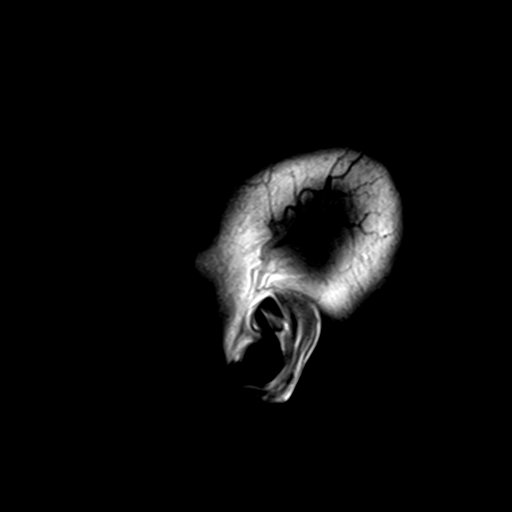
[im 23/23]
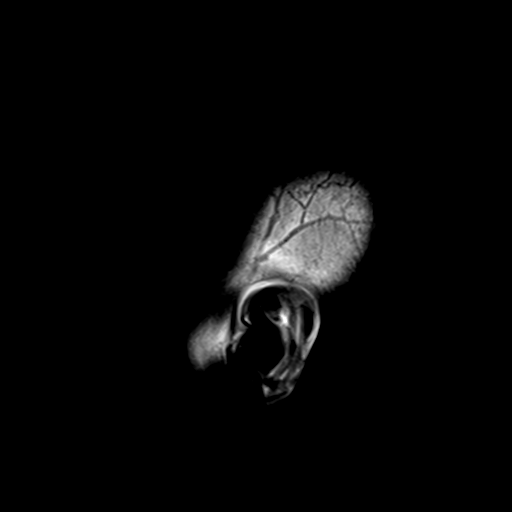

[Series 5: T2 · axial · 5.0mm · 0.60mm/px · z∈[-17,+144]mm · 3 of 26 slices shown (1 of 2)]
[im 1/26]
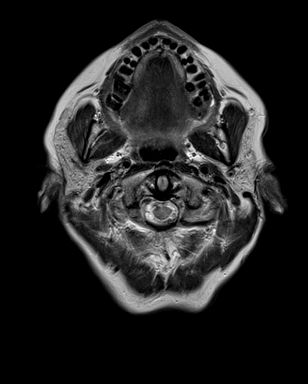
[im 13/26]
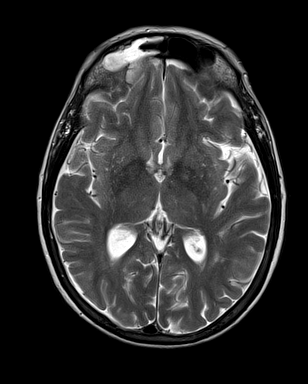
[im 26/26]
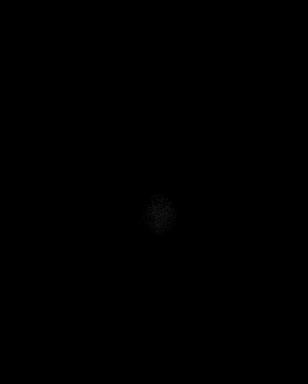

[Series 6: T2 · axial · 5.0mm · 0.90mm/px · z∈[-17,+143]mm · 3 of 26 slices shown (2 of 2)]
[im 1/26]
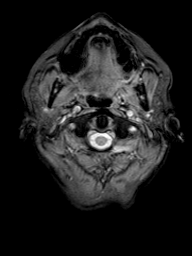
[im 13/26]
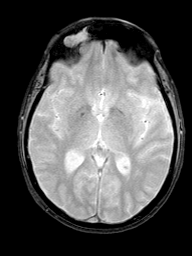
[im 26/26]
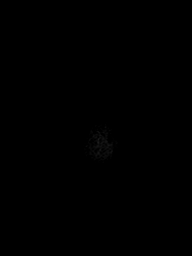

[Series 7: T1 · coronal · 3.0mm · 0.31mm/px · 2 of 17 slices shown (2 of 3)]
[im 1/17]
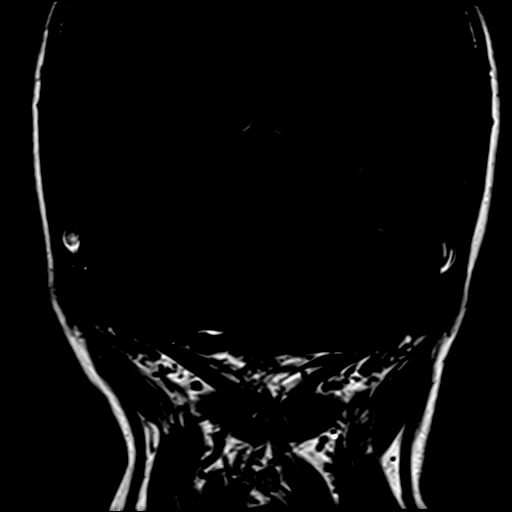
[im 17/17]
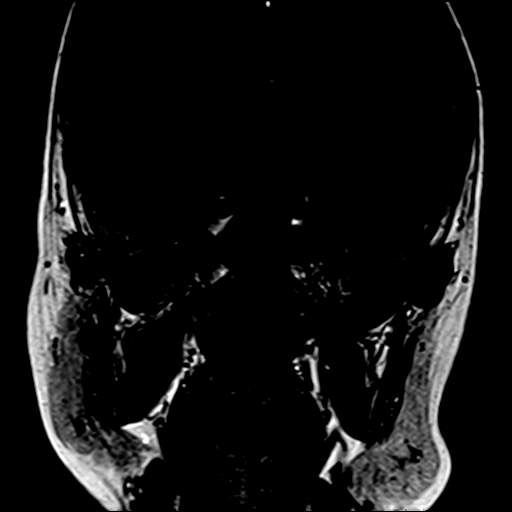

[Series 8: FLAIR · axial · 5.0mm · 0.45mm/px · z∈[-17,+144]mm · 3 of 26 slices shown]
[im 1/26]
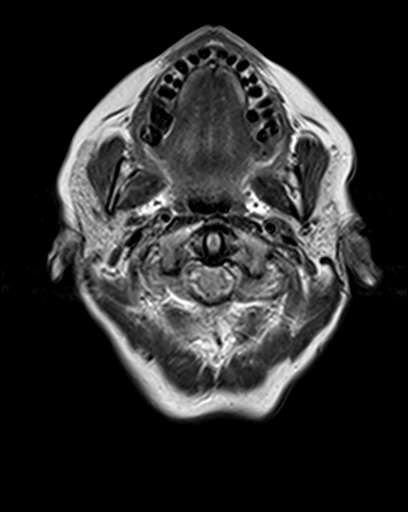
[im 13/26]
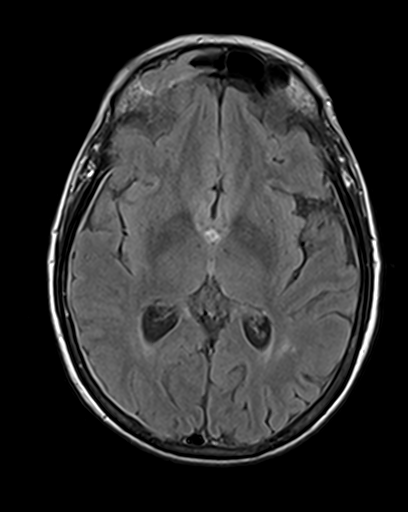
[im 26/26]
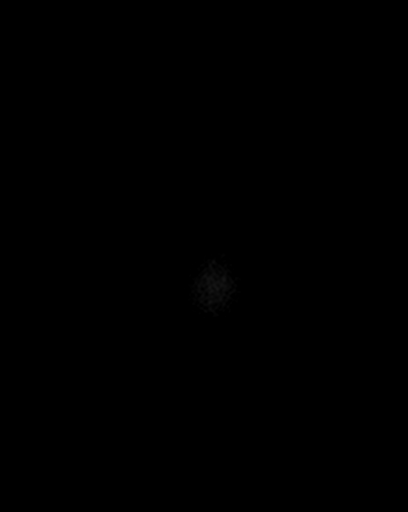

[Series 9: T1 · axial · 3.0mm · 0.31mm/px · z∈[-12,+37]mm · 2 of 16 slices shown (3 of 3)]
[im 1/16]
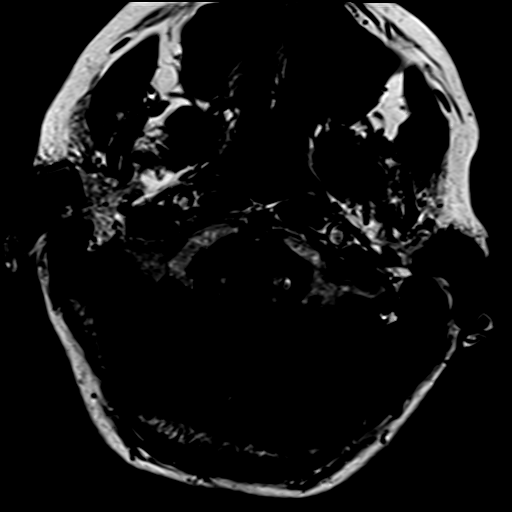
[im 16/16]
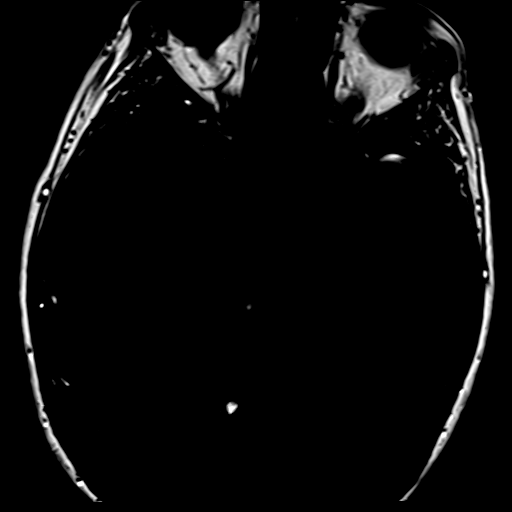

[Series 11: T1 post-contrast · axial · 3.0mm · 0.31mm/px · z∈[-12,+37]mm · 2 of 16 slices shown (1 of 3)]
[im 1/16]
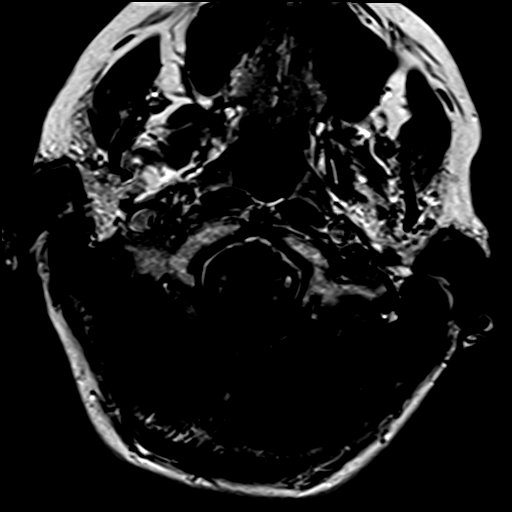
[im 16/16]
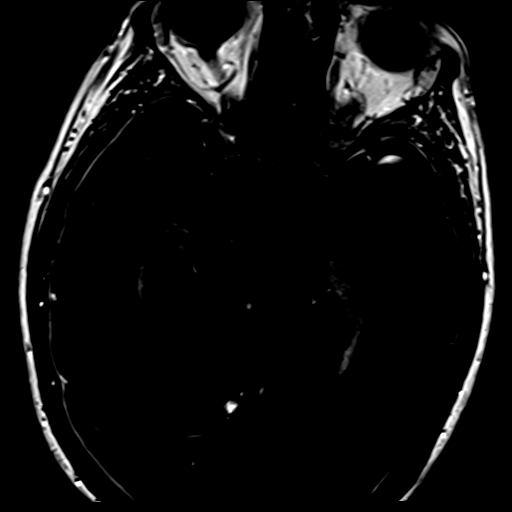

[Series 12: T1 post-contrast · coronal · 3.0mm · 0.31mm/px · 2 of 17 slices shown (2 of 3)]
[im 1/17]
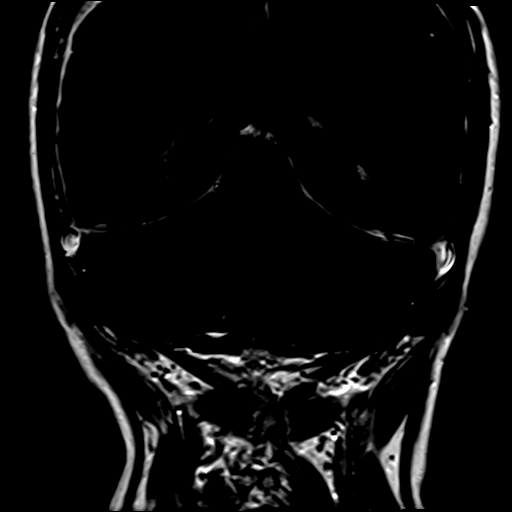
[im 17/17]
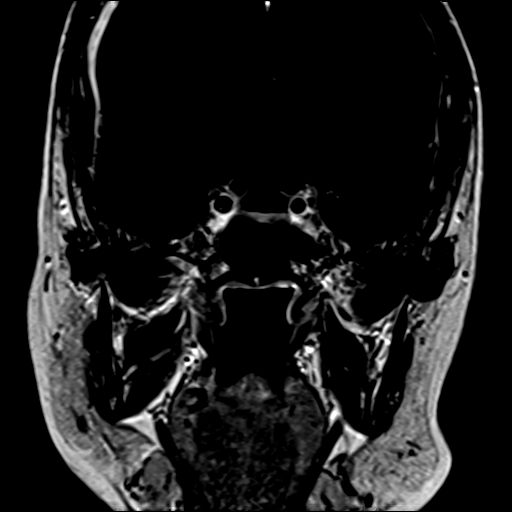

[Series 13: T1 post-contrast · axial · 3.0mm · 0.90mm/px · z∈[-18,+145]mm · 6 of 56 slices shown (3 of 3)]
[im 1/56]
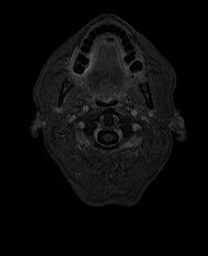
[im 12/56]
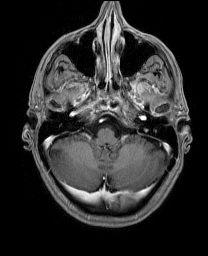
[im 23/56]
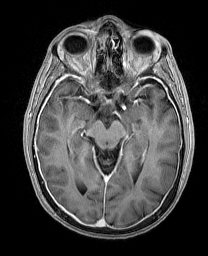
[im 34/56]
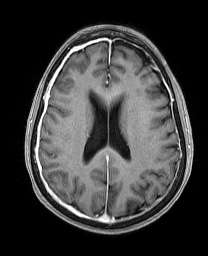
[im 45/56]
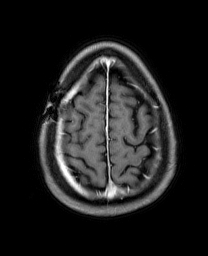
[im 56/56]
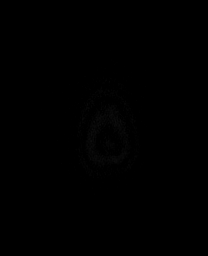

[42 of 48 positions shown; findings below may reference images not displayed]

FINDINGS: Brain: No acute infarction, acute hemorrhage, hydrocephalus, or mass
lesion. Similar 3 mm right and trace left dural thickening
enhancement. Mild multifocal T2/FLAIR hyperintensity white matter,
nonspecific but with chronic microvascular disease.

Dedicated IAC protocol was performed. Subtle asymmetric linear
enhancement within the left IAC fundus (see series 12, image 9;
series 13, image 15). Otherwise, facial nerve enhancement is fairly
symmetric. The visualized seventh and eighth cranial nerves are
otherwise unremarkable without evidence of a mass lesion on
high-resolution T2 imaging. Normal T2 hyperintense see sec signal
within the labyrinth bilaterally. No sizable mastoid effusions.

Vascular: Major arterial flow voids are maintained skull base.

Skull and upper cervical spine: Normal marrow signal.

Sinuses/Orbits: Right frontal and anterior ethmoidal sinus mucosal
thickening. Unremarkable orbits.

Other: No mastoid effusions.
IMPRESSION: 1. Subtle asymmetric linear enhancement within the left IAC fundus,
suspicious for Bell's palsy. Small vestibular schwannoma is thought
less likely given non-masslike appearance and no correlate mass on
high-resolution T2 imaging. If the patient's symptoms do not
resolve, a follow-up MRI with contrast may be useful to assess
stability.
2. Similar 3 mm right and trace left dural thickening and
enhancement, probably the chronic sequela of prior subdural
hematomas.
3. Mild chronic microvascular ischemic disease.

## 2022-02-28 ENCOUNTER — Ambulatory Visit: Payer: Medicare Other | Admitting: *Deleted

## 2022-02-28 DIAGNOSIS — E119 Type 2 diabetes mellitus without complications: Secondary | ICD-10-CM

## 2022-02-28 DIAGNOSIS — I10 Essential (primary) hypertension: Secondary | ICD-10-CM

## 2022-02-28 NOTE — Chronic Care Management (AMB) (Signed)
Chronic Care Management    Clinical Social Work Note  02/28/2022 Name: Tiffany Gregory MRN: [id number] DOB: [birth year]  Tiffany Gregory is a 71 y.o. year old female who is a primary care patient of Burnard Hawthorne, FNP. The CCM team was consulted to assist the patient with chronic disease management and/or care coordination needs related to: Grief Counseling.   Engaged with patient by telephone for follow up visit in response to provider referral for social work chronic care management and care coordination services.   Consent to Services:  The patient was given information about Chronic Care Management services, agreed to services, and gave verbal consent prior to initiation of services.  Please see initial visit note for detailed documentation.   Patient agreed to services and consent obtained.   Assessment: Review of patient past medical history, allergies, medications, and health status, including review of relevant consultants reports was performed today as part of a comprehensive evaluation and provision of chronic care management and care coordination services.     SDOH (Social Determinants of Health) assessments and interventions performed:    Advanced Directives Status: Not addressed in this encounter.  CCM Care Plan  Allergies  Allergen Reactions   Lemon Flavor Anaphylaxis    "my throat closes up"   Keflex [Cephalexin] Itching    Itching and hives no anaphylaxis.    [name] Other (See Comments)   Sulfa Antibiotics Other (See Comments)    Other reaction(s): Unknown   Valsartan Hives    Outpatient Encounter Medications as of 02/28/2022  Medication Sig   acetaminophen (TYLENOL) 325 MG tablet Take 650 mg by mouth every 6 (six) hours as needed.   amLODipine (NORVASC) 5 MG tablet Take 1 tablet (5 mg total) by mouth daily.   aspirin EC 81 MG tablet Take 81 mg by mouth daily. Swallow whole.   atorvastatin (LIPITOR) 40 MG tablet Take 1 tablet (40 mg total) by  mouth daily.   Blood Glucose Monitoring Suppl (ONE TOUCH ULTRA 2) w/Device KIT by Does not apply route.   cyanocobalamin 1000 MCG tablet Take 1 tablet by mouth daily. (Patient not taking: Reported on 01/31/2022)   cyclobenzaprine (FLEXERIL) 10 MG tablet Take 1 tablet (10 mg total) by mouth at bedtime.   glucose blood test strip 1 each by Other route as needed for other. Use as instructed. One touch ultra blue in vitro strips   metFORMIN (GLUCOPHAGE-XR) 500 MG 24 hr tablet TAKE 1 TABLET(500 MG) BY MOUTH DAILY WITH BREAKFAST   omeprazole (PRILOSEC) 20 MG capsule Take 2 capsules (40 mg total) by mouth daily.   OneTouch Delica Lancets 32D MISC by Does not apply route.   polyethylene glycol (MIRALAX / GLYCOLAX) 17 g packet Take 17 g by mouth daily.   Semaglutide, 1 MG/DOSE, (OZEMPIC, 1 MG/DOSE,) 4 MG/3ML SOPN Inject 1 mg into the skin once a week.   No facility-administered encounter medications on file as of 02/28/2022.    Patient Active Problem List   Diagnosis Date Noted   Fatigue due to exposure 08/29/2021   Hypokalemia 08/29/2021   Nerve pain 08/29/2021   History of leukocytosis 08/22/2021   Non-recurrent acute serous otitis media of left ear 08/22/2021   Balance problem 08/22/2021   Bell's palsy 08/12/2021   Nasal congestion 02/14/2021   Spasm of cervical paraspinous muscle 01/24/2021   B12 deficiency 01/24/2021   Neck pain 01/24/2021   Fall 01/24/2021   Gastroesophageal reflux disease 11/29/2020   Memory loss 10/27/2020   History of  craniotomy 10/27/2020   Family history of GERD 10/27/2020   Hyperlipidemia 06/22/2020   Elevated liver enzymes 06/22/2020   History of hepatitis B virus infection 06/22/2020   Overweight with body mass index (BMI) of 27 to 27.9 in adult 06/22/2020   Hepatic steatosis 06/15/2020   Encounter for medical examination to establish care 06/14/2020   Type 2 diabetes mellitus without complication, without long-term current use of insulin ([place]) 06/14/2020    Essential hypertension 06/14/2020   Skin lesion 06/14/2020    Conditions to be addressed/monitored:  Anxiety and Caregiver Stress.  Limited Social Support, Mental Health Concerns, Social Isolation, Limited Access to Caregiver, and Lacks Knowledge of [employer].    Care Plan : LCSW Plan of Care  Updates made by Vern Claude, LCSW since 02/28/2022 12:00 AM     Problem: Find Caregiver Support and Counseling Services in My Community.   Priority: High     Long-Range Goal: Find Acupuncturist in My Community.   Start Date: 06/08/2021  Expected End Date: 02/05/2022  Recent Progress: On track  Priority: High  Note:   Current Barriers:   Acute Mental Health needs related to Anxiety and Caregiver Stress, requires Support, Education, Resources, Referrals and Care Coordination in order to meet unmet mental health needs. Patient's spouse has been hospitalized. Clinical Goal(s):  Patient will work with LCSW to reduce and manage symptoms of Anxiety and Caregiver Stress.     Clinical Interventions:  Follow up phone call to patient to assess caregiver stress and community support needs Confirmed that patient's spouse has now passed away Active Listening utilized, Emotional Support provided, Verbalization of Feelings encouraged related to patient's grief Grief counseling referral discussed-patient agreeable-hospice social worker to visit today Self care explored and emphasized, patient able to verbalize appropriate self care practices including setting strong boundaries with family members  Patient Goals/Self-Care Activities: Continue to receive supportive counseling/emotional support with LCSW, on a monthly basis, to reduce and manage symptoms of grief Contact LCSW directly if you have questions, need assistance, or if additional social work needs are identified between now and our next scheduled telephone outreach call.          Follow Up Plan: SW will  follow up with patient by phone over the next 30 business days      [employer], [place] [phone]

## 2022-02-28 NOTE — Patient Instructions (Signed)
Visit Information  Thank you for taking time to visit with me today. Please don't hesitate to contact me if I can be of assistance to you before our next scheduled telephone appointment.  Following are the goals we discussed today:  ent Goals/Self-Care Activities: Continue to receive supportive counseling/emotional support from LCSW Please contact Hospice and Palliative Care for grief grief counseling Contact LCSW directly if you have questions, need assistance, or if additional social work needs are identified between now and our next scheduled telephone outreach call.    Our next appointment is by telephone on 03/28/22 at 10am  Please call the care guide team at [phone] if you need to cancel or reschedule your appointment.   If you are experiencing a Mental Health or [place] or need someone to talk to, please call the Suicide and Crisis Lifeline: 988   Patient verbalizes understanding of instructions and care plan provided today and agrees to view in [place]. Active MyChart status and patient understanding of how to access instructions and care plan via MyChart confirmed with patient.     Telephone follow up appointment with care management team member scheduled for: 03/28/22  Elliot Gurney, [place] [phone]

## 2022-03-07 DIAGNOSIS — F419 Anxiety disorder, unspecified: Secondary | ICD-10-CM

## 2022-03-14 ENCOUNTER — Other Ambulatory Visit (INDEPENDENT_AMBULATORY_CARE_PROVIDER_SITE_OTHER): Payer: Medicare Other

## 2022-03-14 DIAGNOSIS — I10 Essential (primary) hypertension: Secondary | ICD-10-CM

## 2022-03-14 DIAGNOSIS — E119 Type 2 diabetes mellitus without complications: Secondary | ICD-10-CM

## 2022-03-14 LAB — COMPREHENSIVE METABOLIC PANEL
ALT: 8 U/L (ref 0–35)
AST: 12 U/L (ref 0–37)
Albumin: 4.2 g/dL (ref 3.5–5.2)
Alkaline Phosphatase: 62 U/L (ref 39–117)
BUN: 11 mg/dL (ref 6–23)
CO2: 30 mEq/L (ref 19–32)
Calcium: 9.3 mg/dL (ref 8.4–10.5)
Chloride: 102 mEq/L (ref 96–112)
Creatinine, Ser: 0.64 mg/dL (ref 0.40–1.20)
GFR: 89.68 mL/min (ref >60.00)
Glucose, Bld: 88 mg/dL (ref 70–99)
Potassium: 3.9 mEq/L (ref 3.5–5.1)
Sodium: 140 mEq/L (ref 135–145)
Total Bilirubin: 0.5 mg/dL (ref 0.2–1.2)
Total Protein: 6.8 g/dL (ref 6.0–8.3)

## 2022-03-14 LAB — LIPID PANEL
Cholesterol: 109 mg/dL (ref 0–200)
HDL: 39.3 mg/dL (ref >39.00)
LDL Cholesterol: 52 mg/dL (ref 0–99)
NonHDL: 69.82
Total CHOL/HDL Ratio: 3
Triglycerides: 87 mg/dL (ref 0.0–149.0)
VLDL: 17.4 mg/dL (ref 0.0–40.0)

## 2022-03-14 LAB — MICROALBUMIN / CREATININE URINE RATIO
Creatinine,U: 112 mg/dL
Microalb Creat Ratio: 0.6 mg/g (ref 0.0–30.0)
Microalb, Ur: <0.7 mg/dL (ref 0.0–1.9)

## 2022-03-23 ENCOUNTER — Emergency Department: Payer: Medicare Other

## 2022-03-23 ENCOUNTER — Encounter: Payer: Self-pay | Admitting: Intensive Care

## 2022-03-23 ENCOUNTER — Other Ambulatory Visit: Payer: Self-pay

## 2022-03-23 ENCOUNTER — Ambulatory Visit (INDEPENDENT_AMBULATORY_CARE_PROVIDER_SITE_OTHER): Payer: Medicare Other

## 2022-03-23 ENCOUNTER — Telehealth: Payer: Self-pay | Admitting: Family

## 2022-03-23 ENCOUNTER — Emergency Department
Admission: EM | Admit: 2022-03-23 | Discharge: 2022-03-23 | Discharge status: Against Medical Advice | Payer: Medicare Other | Attending: Emergency Medicine | Admitting: Emergency Medicine

## 2022-03-23 VITALS — Ht 64.0 in | Wt 142.0 lb

## 2022-03-23 DIAGNOSIS — Z0000 Encounter for general adult medical examination without abnormal findings: Secondary | ICD-10-CM

## 2022-03-23 DIAGNOSIS — R002 Palpitations: Secondary | ICD-10-CM | POA: Insufficient documentation

## 2022-03-23 DIAGNOSIS — Z5321 Procedure and treatment not carried out due to patient leaving prior to being seen by health care provider: Secondary | ICD-10-CM | POA: Diagnosis not present

## 2022-03-23 DIAGNOSIS — H538 Other visual disturbances: Secondary | ICD-10-CM | POA: Insufficient documentation

## 2022-03-23 LAB — CBC
HCT: 36.5 % (ref 36.0–46.0)
Hemoglobin: 12 g/dL (ref 12.0–15.0)
MCH: 27.8 pg (ref 26.0–34.0)
MCHC: 32.9 g/dL (ref 30.0–36.0)
MCV: 84.7 fL (ref 80.0–100.0)
Platelets: 295 10*3/uL (ref 150–400)
RBC: 4.31 MIL/uL (ref 3.87–5.11)
RDW: 14 % (ref 11.5–15.5)
WBC: 9.7 10*3/uL (ref 4.0–10.5)
nRBC: 0 % (ref 0.0–0.2)

## 2022-03-23 LAB — BASIC METABOLIC PANEL
Anion gap: 6 (ref 5–15)
BUN: 14 mg/dL (ref 8–23)
CO2: 28 mmol/L (ref 22–32)
Calcium: 9.1 mg/dL (ref 8.9–10.3)
Chloride: 102 mmol/L (ref 98–111)
Creatinine, Ser: 0.71 mg/dL (ref 0.44–1.00)
GFR, Estimated: >60 mL/min (ref >60)
Glucose, Bld: 142 mg/dL — ABNORMAL HIGH (ref 70–99)
Potassium: 4.1 mmol/L (ref 3.5–5.1)
Sodium: 136 mmol/L (ref 135–145)

## 2022-03-23 LAB — TROPONIN I (HIGH SENSITIVITY): Troponin I (High Sensitivity): 4 ng/L (ref <18)

## 2022-03-23 NOTE — Progress Notes (Signed)
Subjective:   Tiffany Gregory is a 71 y.o. female who presents for Medicare Annual (Subsequent) preventive examination.  Review of Systems    No ROS.  Medicare Wellness Virtual Visit.  Visual/audio telehealth visit, UTA vital signs.   See social history for additional risk factors.   Cardiac Risk Factors include: advanced age (>79mn, >>29women)     Objective:    Today's Vitals   03/23/22 1349  Weight: 142 lb (64.4 kg)  Height: 5' 4"  (1.626 m)   Body mass index is 24.37 kg/m.     03/23/2022    2:15 PM 06/08/2021    4:58 PM 01/15/2021    8:19 PM 11/29/2020   11:11 AM 10/19/2020   12:13 PM  Advanced Directives  Does Patient Have a Medical Advance Directive? No Yes No Yes Yes  Type of ACorporate treasurerof ANolicLiving will  [place] will Living will  Does patient want to make changes to medical advance directive?  No - Patient declined     Copy of [place]?  No - copy requested  No - copy requested   Would patient like information on creating a medical advance directive? No - Patient declined  No - Patient declined      Current Medications (verified) Outpatient Encounter Medications as of 03/23/2022  Medication Sig   acetaminophen (TYLENOL) 325 MG tablet Take 650 mg by mouth every 6 (six) hours as needed.   amLODipine (NORVASC) 5 MG tablet Take 1 tablet (5 mg total) by mouth daily.   aspirin EC 81 MG tablet Take 81 mg by mouth daily. Swallow whole.   atorvastatin (LIPITOR) 40 MG tablet Take 1 tablet (40 mg total) by mouth daily.   Blood Glucose Monitoring Suppl (ONE TOUCH ULTRA 2) w/Device KIT by Does not apply route.   cyanocobalamin 1000 MCG tablet Take 1 tablet by mouth daily. (Patient not taking: Reported on 01/31/2022)   cyclobenzaprine (FLEXERIL) 10 MG tablet Take 1 tablet (10 mg total) by mouth at bedtime.   glucose blood test strip 1 each by Other route as needed for other. Use as instructed.  One touch ultra blue in vitro strips   metFORMIN (GLUCOPHAGE-XR) 500 MG 24 hr tablet TAKE 1 TABLET(500 MG) BY MOUTH DAILY WITH BREAKFAST   omeprazole (PRILOSEC) 20 MG capsule Take 2 capsules (40 mg total) by mouth daily.   OneTouch Delica Lancets 376KMISC by Does not apply route.   polyethylene glycol (MIRALAX / GLYCOLAX) 17 g packet Take 17 g by mouth daily.   Semaglutide, 1 MG/DOSE, (OZEMPIC, 1 MG/DOSE,) 4 MG/3ML SOPN Inject 1 mg into the skin once a week.   No facility-administered encounter medications on file as of 03/23/2022.    Allergies (verified) Lemon flavor, Keflex [cephalexin], [name], Sulfa antibiotics, and Valsartan   History: Past Medical History:  Diagnosis Date   Allergy    Asthma    Diabetes mellitus without complication (HArp    Elevated liver enzymes    Hepatic steatosis    History of hepatitis B 1971   She had IV drug use experimentation and was hosptialized and reports treated.    Hyperlipidemia    Hypertension    Past Surgical History:  Procedure Laterality Date   ABDOMINAL HYSTERECTOMY     BREAST BIOPSY     CRANIECTOMY     NASAL FRACTURE SURGERY  1975   NASAL SINUS SURGERY  2010   Family History  Problem  Relation Age of Onset   Diabetes Mother    Heart attack Mother    Diabetes Father    Heart disease Father    Hypertension Father    Diabetes Sister    COPD Sister    Heart disease Sister    Diabetes Brother    Heart attack Maternal Grandmother    Cancer Maternal Grandfather    Heart attack Paternal Grandmother    Cancer Paternal Grandfather    Heart attack Brother    Heart disease Brother    Social History   Socioeconomic History   Marital status: Married    Spouse name: Janique Hoefer   Number of children: 2   Years of education: 16   Highest education level: Bachelor's degree (e.g., BA, AB, BS)  Occupational History   Occupation: self employed  Tobacco Use   Smoking status: Never    Passive exposure: Past    Smokeless tobacco: Never  Vaping Use   Vaping Use: Never used  Substance and Sexual Activity   Alcohol use: Not Currently   Drug use: Never   Sexual activity: Not Currently    Partners: Male  Other Topics Concern   Not on file  Social History Narrative   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    Social Determinants of Health   Financial Resource Strain: Low Risk  (03/23/2022)   Overall Financial Resource Strain (CARDIA)    Difficulty of Paying Living Expenses: Not very hard  Food Insecurity: No Food Insecurity (03/23/2022)   Hunger Vital Sign    Worried About Running Out of Food in the Last Year: Never true    Ran Out of Food in the Last Year: Never true  Transportation Needs: No Transportation Needs (03/23/2022)   PRAPARE - Hydrologist (Medical): No    Lack of Transportation (Non-Medical): No  Physical Activity: Inactive (06/08/2021)   Exercise Vital Sign    Days of Exercise per Week: 0 days    Minutes of Exercise per Session: 0 min  Stress: Stress Concern Present (03/23/2022)   [place]    Feeling of Stress : To some extent  Social Connections: Moderately Isolated (03/23/2022)   Social Connection and Isolation Panel [NHANES]    Frequency of Communication with Friends and Family: More than three times a week    Frequency of Social Gatherings with Friends and Family: More than three times a week    Attends Religious Services: 1 to 4 times per year    Active Member of [employer] or Organizations: No    Attends Archivist Meetings: Never    Marital Status: Widowed    Tobacco Counseling Counseling given: Not Answered   Clinical Intake:  Pre-visit preparation completed: Yes        Diabetes: Yes (Followed by PCP)  How often do you need to have someone help you when you read instructions, pamphlets, or other written materials  from your doctor or pharmacy?: 1 - Never   Interpreter Needed?: No      Activities of Daily Living    03/23/2022    2:03 PM 06/08/2021    4:57 PM  In your present state of health, do you have any difficulty performing the following activities:  Hearing? 0 0  Vision? 0 0  Difficulty concentrating or making decisions? 0 0  Walking or climbing stairs? 0 0  Dressing or bathing?  0 0  Doing errands, shopping? 0 0  Preparing Food and eating ? N N  Using the Toilet? N N  In the past six months, have you accidently leaked urine? N N  Do you have problems with loss of bowel control? N N  Managing your Medications? N N  Managing your Finances? N N  Housekeeping or managing your Housekeeping? N N    Patient Care Team: Burnard Hawthorne, FNP as PCP - General (Family Medicine) [name], MD as Consulting Physician (Ophthalmology) [name], MD as Consulting Physician (Neurology) [name], MD as Consulting Physician (Neurosurgery) [name], MD (Dermatology) [name], MD (Inactive) as Consulting Physician (Gastroenterology)  Indicate any recent Medical Services you may have received from other than [facility] providers in the past year (date may be approximate).     Assessment:   This is a routine wellness examination for Tiffany Gregory.  Virtual Visit via Telephone Note  I connected with  Tiffany Gregory on 03/23/22 at  1:45 PM EDT by telephone and verified that I am speaking with the correct person using two identifiers.  Persons participating in the virtual visit: patient/Nurse Health Advisor   I discussed the limitations of performing an evaluation and management service by telehealth. We continued and completed visit with audio only. Some vital signs may be absent or patient reported.   Hearing/Vision screen Hearing Screening - Comments:: Patient is able to hear conversational tones without difficulty.  No issues reported. Vision Screening -  Comments:: Followed by [facility], Dr. Edison Pace No retinopathy reported Wears corrective lenses They have seen their ophthalmologist in the last 12 months.    Dietary issues and exercise activities discussed: Current Exercise Habits: Home exercise routine, Type of exercise: walking, Intensity: Mild Healthy diet   Goals Addressed             This Visit's Progress    Increase physical activity       Join the [employer] for continued exercise  Water aerobics/swiming       Depression Screen    03/23/2022    1:54 PM 09/25/2021    9:03 AM 08/29/2021    9:37 AM 08/22/2021    9:37 AM 08/15/2021    3:02 PM 06/08/2021    4:56 PM 11/29/2020   11:01 AM  PHQ 2/9 Scores  PHQ - 2 Score  4 2 2  0 2 1  PHQ- 9 Score      5 5  Exception Documentation Other- indicate reason in comment box        Not completed Monthly visits with clinical therapist/social worker          Fall Risk    09/25/2021    9:03 AM 08/29/2021    9:37 AM 08/22/2021    9:36 AM 08/15/2021    3:01 PM 06/08/2021    4:56 PM  Fall Risk   Falls in the past year? 1 1 1 1  0  Number falls in past yr: 1 0 1 0 0  Injury with Fall? 0 1 1 0 0  Risk for fall due to :     No Fall Risks  Follow up Falls evaluation completed Falls evaluation completed Falls evaluation completed Falls evaluation completed Falls evaluation completed;Education provided;Falls prevention discussed    FALL RISK PREVENTION PERTAINING TO THE HOME: Home free of loose throw rugs in walkways, pet beds, electrical cords, etc? Yes  Adequate lighting in your home to reduce risk of falls? Yes  ASSISTIVE DEVICES UTILIZED TO PREVENT FALLS: Life alert? No  Use of a cane, walker or w/c? No   TIMED UP AND GO: Was the test performed? No .   Cognitive Function:  Patient is alert and oriented x3.       Immunizations Immunization History  Administered Date(s) Administered   Fluad Quad(high Dose 65+) 07/22/2020   Hep A / Hep B 01/10/2021, 02/10/2021    Influenza-Unspecified 07/17/2018   Moderna Sars-Covid-2 Vaccination 01/13/2020, 02/10/2020, 08/27/2020   Pneumococcal Conjugate-13 05/15/2017   Pneumococcal-Unspecified 10/08/2009   Shingrix Completed?: No.    Education has been provided regarding the importance of this vaccine. Patient has been advised to call insurance company to determine out of pocket expense if they have not yet received this vaccine. Advised may also receive vaccine at local pharmacy or Health Dept. Verbalized acceptance and understanding.  Screening Tests Health Maintenance  Topic Date Due   COVID-19 Vaccine (4 - Booster for Moderna series) 04/08/2022 (Originally 10/22/2020)   Zoster Vaccines- Shingrix (1 of 2) 06/23/2022 (Originally 01/05/1971)   Pneumonia Vaccine 38+ Years old (2 - PPSV23 if available, else PCV20) 02/01/2023 (Originally 05/15/2018)   MAMMOGRAM  02/01/2023 (Originally 01/04/2002)   DEXA SCAN  02/01/2023 (Originally 01/04/2017)   COLONOSCOPY (Pts 45-42yr Insurance coverage will need to be confirmed)  02/01/2023 (Originally 01/04/1997)   TETANUS/TDAP  02/01/2023 (Originally 01/05/1971)   INFLUENZA VACCINE  05/08/2022   HEMOGLOBIN A1C  08/02/2022   OPHTHALMOLOGY EXAM  08/30/2022   FOOT EXAM  02/01/2023   URINE MICROALBUMIN  03/15/2023   Hepatitis C Screening  Completed   HPV VACCINES  Aged Out   Health Maintenance There are no preventive care reminders to display for this patient.  Lung Cancer Screening: (Low Dose CT Chest recommended if Age 70-80years, 30 pack-year currently smoking OR have quit w/in 15years.) does not qualify.   Vision Screening: Recommended annual ophthalmology exams for early detection of glaucoma and other disorders of the eye.  Dental Screening: Recommended annual dental exams for proper oral hygiene.   Community Resource Referral / Chronic Care Management: CRR required this visit?  No   CCM required this visit?  No      Plan:   Keep all routine maintenance  appointments.   I have personally reviewed and noted the following in the patient's chart:   Medical and social history Use of alcohol, tobacco or illicit drugs  Current medications and supplements including opioid prescriptions.  Functional ability and status Nutritional status Physical activity Advanced directives List of other physicians Hospitalizations, surgeries, and ER visits in previous 12 months Vitals Screenings to include cognitive, depression, and falls Referrals and appointments  In addition, I have reviewed and discussed with patient certain preventive protocols, quality metrics, and best practice recommendations. A written personalized care plan for preventive services as well as general preventive health recommendations were provided to patient.     OVarney Biles LPN   [id number]

## 2022-03-23 NOTE — Telephone Encounter (Signed)
Called patient back to inquire about her message. Patient stated that she was in route to go to ED to get checked out while we were on the phone.Will check back on Monday to see how she is doing? As it is 5pm on Friday?

## 2022-03-23 NOTE — ED Triage Notes (Signed)
Patient arrived by EMS from home with c/o palpitations and tunnel vision. V/S WNL. Patient reports her husband passed away 3 weeks ago.

## 2022-03-23 NOTE — Telephone Encounter (Signed)
Pt called stating she was working on her computer and her heart started racing. Pt checked her BP and it was 124/60, blood sugar 106 and heart-100. Sent to access nurse

## 2022-03-23 NOTE — Patient Instructions (Addendum)
  Tiffany Gregory , Thank you for taking time to come for your Medicare Wellness Visit. I appreciate your ongoing commitment to your health goals. Please review the following plan we discussed and let me know if I can assist you in the future.   These are the goals we discussed:  Goals       Patient Stated     Find Multimedia programmer and Counseling Services in My Community. (pt-stated)      Timeframe:  Long-Range Goal Priority:  High Start Date:  06/08/2021                      Expected End Date:  02/05/2022              Patient Goals/Self-Care Activities: Continue to receive supportive counseling/emotional support from LCSW Please contact Hospice and Palliative Care for grief grief counseling Contact LCSW directly if you have questions, need assistance, or if additional social work needs are identified between now and our next scheduled telephone outreach call.        Other     Increase physical activity      Join the [facility] for continued exercise  Water aerobics/swiming        This is a list of the screening recommended for you and due dates:  Health Maintenance  Topic Date Due   COVID-19 Vaccine (4 - Booster for Moderna series) 04/08/2022*   Zoster (Shingles) Vaccine (1 of 2) 06/23/2022*   Pneumonia Vaccine (2 - PPSV23 if available, else PCV20) 02/01/2023*   Mammogram  02/01/2023*   DEXA scan (bone density measurement)  02/01/2023*   Colon Cancer Screening  02/01/2023*   Tetanus Vaccine  02/01/2023*   Flu Shot  05/08/2022   Hemoglobin A1C  08/02/2022   Eye exam for diabetics  08/30/2022   Complete foot exam   02/01/2023   Urine Protein Check  03/15/2023   Hepatitis C Screening: USPSTF Recommendation to screen - Ages 18-79 yo.  Completed   HPV Vaccine  Aged Out  *Topic was postponed. The date shown is not the original due date.

## 2022-03-23 NOTE — ED Triage Notes (Signed)
First Nurse Note:  Pt via EMS from home. Pt c/o palpitations while she was working in her office today and "fuzzy headed", pt doesn't know if it is anxiety. Pt is A&Ox4 and NAD  Denies cardiac hx.   CBG 140  122/64 96% on RA 20 G L forearm

## 2022-03-28 ENCOUNTER — Ambulatory Visit (INDEPENDENT_AMBULATORY_CARE_PROVIDER_SITE_OTHER): Payer: Medicare Other | Admitting: *Deleted

## 2022-03-28 ENCOUNTER — Other Ambulatory Visit: Payer: Self-pay

## 2022-03-28 DIAGNOSIS — E119 Type 2 diabetes mellitus without complications: Secondary | ICD-10-CM

## 2022-03-28 DIAGNOSIS — K219 Gastro-esophageal reflux disease without esophagitis: Secondary | ICD-10-CM

## 2022-03-28 DIAGNOSIS — I10 Essential (primary) hypertension: Secondary | ICD-10-CM

## 2022-03-28 MED ORDER — OMEPRAZOLE 20 MG PO CPDR: 40.0000 mg | DELAYED_RELEASE_CAPSULE | Freq: Every day | ORAL | 0 refills | Status: DC

## 2022-03-28 NOTE — Chronic Care Management (AMB) (Signed)
Chronic Care Management    Clinical Social Work Note  03/28/2022 Name: Tiffany Gregory MRN: [id number] DOB: 1951-03-19  Tiffany Gregory is a 71 y.o. year old female who is a primary care patient of Burnard Hawthorne, FNP. The CCM team was consulted to assist the patient with chronic disease management and/or care coordination needs related to: Mental Health Counseling and Resources.   Engaged with patient by telephone for follow up visit in response to provider referral for social work chronic care management and care coordination services.   Consent to Services:  The patient was given information about Chronic Care Management services, agreed to services, and gave verbal consent prior to initiation of services.  Please see initial visit note for detailed documentation.   Patient agreed to services and consent obtained.   Assessment: Review of patient past medical history, allergies, medications, and health status, including review of relevant consultants reports was performed today as part of a comprehensive evaluation and provision of chronic care management and care coordination services.     SDOH (Social Determinants of Health) assessments and interventions performed:    Advanced Directives Status: Not addressed in this encounter.  CCM Care Plan  Allergies  Allergen Reactions   Lemon Flavor Anaphylaxis    "my throat closes up"   Keflex [Cephalexin] Itching    Itching and hives no anaphylaxis.    [name] Other (See Comments)   Sulfa Antibiotics Other (See Comments)    Other reaction(s): Unknown   Valsartan Hives    Outpatient Encounter Medications as of 03/28/2022  Medication Sig   acetaminophen (TYLENOL) 325 MG tablet Take 650 mg by mouth every 6 (six) hours as needed.   amLODipine (NORVASC) 5 MG tablet Take 1 tablet (5 mg total) by mouth daily.   aspirin EC 81 MG tablet Take 81 mg by mouth daily. Swallow whole.   atorvastatin (LIPITOR) 40 MG tablet Take 1 tablet  (40 mg total) by mouth daily.   Blood Glucose Monitoring Suppl (ONE TOUCH ULTRA 2) w/Device KIT by Does not apply route.   cyanocobalamin 1000 MCG tablet Take 1 tablet by mouth daily. (Patient not taking: Reported on 01/31/2022)   cyclobenzaprine (FLEXERIL) 10 MG tablet Take 1 tablet (10 mg total) by mouth at bedtime.   glucose blood test strip 1 each by Other route as needed for other. Use as instructed. One touch ultra blue in vitro strips   metFORMIN (GLUCOPHAGE-XR) 500 MG 24 hr tablet TAKE 1 TABLET(500 MG) BY MOUTH DAILY WITH BREAKFAST   omeprazole (PRILOSEC) 20 MG capsule Take 2 capsules (40 mg total) by mouth daily.   OneTouch Delica Lancets 09N MISC by Does not apply route.   polyethylene glycol (MIRALAX / GLYCOLAX) 17 g packet Take 17 g by mouth daily.   Semaglutide, 1 MG/DOSE, (OZEMPIC, 1 MG/DOSE,) 4 MG/3ML SOPN Inject 1 mg into the skin once a week.   No facility-administered encounter medications on file as of 03/28/2022.    Patient Active Problem List   Diagnosis Date Noted   Fatigue due to exposure 08/29/2021   Hypokalemia 08/29/2021   Nerve pain 08/29/2021   History of leukocytosis 08/22/2021   Non-recurrent acute serous otitis media of left ear 08/22/2021   Balance problem 08/22/2021   Bell's palsy 08/12/2021   Nasal congestion 02/14/2021   Spasm of cervical paraspinous muscle 01/24/2021   B12 deficiency 01/24/2021   Neck pain 01/24/2021   Fall 01/24/2021   Gastroesophageal reflux disease 11/29/2020   Memory loss 10/27/2020  History of craniotomy 10/27/2020   Family history of GERD 10/27/2020   Hyperlipidemia 06/22/2020   Elevated liver enzymes 06/22/2020   History of hepatitis B virus infection 06/22/2020   Overweight with body mass index (BMI) of 27 to 27.9 in adult 06/22/2020   Hepatic steatosis 06/15/2020   Encounter for medical examination to establish care 06/14/2020   Type 2 diabetes mellitus without complication, without long-term current use of insulin  ([place]) 06/14/2020   Essential hypertension 06/14/2020   Skin lesion 06/14/2020    Conditions to be addressed/monitored: Anxiety and Bereavement ; Mental Health Concerns   Care Plan : LCSW Plan of Care  Updates made by Vern Claude, LCSW since 03/28/2022 12:00 AM     Problem: Find Caregiver Support and Counseling Services in My Community.   Priority: High     Long-Range Goal: Find Acupuncturist in My Community.   Start Date: 06/08/2021  Expected End Date: 02/05/2022  Recent Progress: On track  Priority: High  Note:   Current Barriers:   Acute Mental Health needs related to Anxiety and Caregiver Stress, requires Support, Education, Resources, Referrals and Care Coordination in order to meet unmet mental health needs. Patient's spouse has been hospitalized. Clinical Goal(s):  Patient will work with LCSW to reduce and manage symptoms of Anxiety and Caregiver Stress.     Clinical Interventions:  Follow up phone call to patient to follow up on additional community resource/mental health needs following death of her spouse Active Listening utilized, Emotional Support provided, Verbalization of Feelings encouraged related to patient's grief Grief counseling referral discussed-patient agreeable-hospice social worker visited couple of weeks ago-encouraged patient to call and schedule appointment if needed Self care explored and emphasized, patient able to verbalize a strong network  of support, appropriate self care practices including setting strong boundaries with family members ,journaling and positive self talk Discussed plan to close patient to CCM at this time due to progress maded, patient agreeable to reaching out to grief support if needed.  Patient Goals/Self-Care Activities: Patient to contact [facility] for grief counseling when needed        Follow Up Plan: Client will be closed to CCM services at this time and is agreeable to contacting  [facility] for Grief Counseling if needed.      [address], [place] [phone]

## 2022-03-28 NOTE — Patient Instructions (Signed)
Visit Information  Thank you for taking time to visit with me today. Please don't hesitate to contact me if I can be of assistance to you before our next scheduled telephone appointment.  Following are the goals we discussed today:  Please contact Hospice and Palliative Care for grief grief counseling if needed  If you are experiencing a Mental Health or [place] or need someone to talk to, please call the Suicide and Crisis Lifeline: [phone]   Patient verbalizes understanding of instructions and care plan provided today and agrees to view in [place]. Active MyChart status and patient understanding of how to access instructions and care plan via MyChart confirmed with patient.     No further follow up required, patient to contact Hospice and Palliative Care for grief grief counseling if needed  [employer], Jane [phone]

## 2022-04-06 DIAGNOSIS — I10 Essential (primary) hypertension: Secondary | ICD-10-CM

## 2022-04-06 DIAGNOSIS — E119 Type 2 diabetes mellitus without complications: Secondary | ICD-10-CM

## 2022-04-19 ENCOUNTER — Telehealth: Payer: Self-pay

## 2022-04-19 NOTE — Telephone Encounter (Signed)
Pt advised Ozempic from patient assistance here, ready for pick up

## 2022-04-27 ENCOUNTER — Encounter: Payer: Self-pay | Admitting: Family

## 2022-04-27 ENCOUNTER — Ambulatory Visit (INDEPENDENT_AMBULATORY_CARE_PROVIDER_SITE_OTHER): Payer: Medicare Other | Admitting: Family

## 2022-04-27 VITALS — BP 118/70 | HR 70 | Temp 98.2°F | Ht 64.0 in | Wt 139.6 lb

## 2022-04-27 DIAGNOSIS — E119 Type 2 diabetes mellitus without complications: Secondary | ICD-10-CM | POA: Diagnosis not present

## 2022-04-27 DIAGNOSIS — I10 Essential (primary) hypertension: Secondary | ICD-10-CM | POA: Diagnosis not present

## 2022-04-27 DIAGNOSIS — K219 Gastro-esophageal reflux disease without esophagitis: Secondary | ICD-10-CM

## 2022-04-27 MED ORDER — OMEPRAZOLE 20 MG PO CPDR: 20.0000 mg | DELAYED_RELEASE_CAPSULE | Freq: Every day | ORAL | 0 refills | Status: DC

## 2022-04-27 MED ORDER — RAMIPRIL 10 MG PO CAPS: 20.0000 mg | ORAL_CAPSULE | Freq: Every day | ORAL | 1 refills | Status: DC

## 2022-04-27 NOTE — Progress Notes (Signed)
Subjective:    Patient ID: Tiffany Gregory, female    DOB: 08/24/1951, 71 y.o.   MRN: [id number]  CC: Tiffany Gregory is a 71 y.o. female who presents today for follow up.   HPI: GERD- she has been on omeprazole 86m for years. She had read about side effects.  No changes to bowel habits, trouble or pain with swallowing.  No ho barrett's esophagus   Declines screening for colonoscopy    Husband passed away 5June 10, 2023 She has appointment with grief counselor next week.   Hypertension-compliant with ramipril 2733m amlodipine 33m44mDiabetes-compliant with Ozempic 1 mg, metformin 500 mg. FBG 98-110.  She is tolerating regimen and overall pleased  HLD-compliant with Lipitor 40 mg   HISTORY:  Past Medical History:  Diagnosis Date   Allergy    Asthma    Diabetes mellitus without complication (HCC)    Elevated liver enzymes    Hepatic steatosis    History of hepatitis B 1971   She had IV drug use experimentation and was hosptialized and reports treated.    Hyperlipidemia    Hypertension    Past Surgical History:  Procedure Laterality Date   ABDOMINAL HYSTERECTOMY     BREAST BIOPSY     CRANIECTOMY     NASAL FRACTURE SURGERY  1975   NASAL SINUS SURGERY  2010   Family History  Problem Relation Age of Onset   Diabetes Mother    Heart attack Mother    Diabetes Father    Heart disease Father    Hypertension Father    Diabetes Sister    COPD Sister    Heart disease Sister    Diabetes Brother    Heart attack Maternal Grandmother    Cancer Maternal Grandfather    Heart attack Paternal Grandmother    Cancer Paternal Grandfather    Heart attack Brother    Heart disease Brother     Allergies: [employer] sales professionaleflex [cephalexin], [name], Sulfa antibiotics, and Valsartan Current Outpatient Medications on File Prior to Visit  Medication Sig Dispense Refill   acetaminophen (TYLENOL) 325 MG tablet Take 650 mg by mouth every 6 (six) hours as needed.     amLODipine  (NORVASC) 5 MG tablet Take 1 tablet (5 mg total) by mouth daily. 90 tablet 3   aspirin EC 81 MG tablet Take 81 mg by mouth daily. Swallow whole.     atorvastatin (LIPITOR) 40 MG tablet Take 1 tablet (40 mg total) by mouth daily. 90 tablet 3   Blood Glucose Monitoring Suppl (ONE TOUCH ULTRA 2) w/Device KIT by Does not apply route.     cyclobenzaprine (FLEXERIL) 10 MG tablet Take 1 tablet (10 mg total) by mouth at bedtime. 30 tablet 0   glucose blood test strip 1 each by Other route as needed for other. Use as instructed. One touch ultra blue in vitro strips 100 each 3   metFORMIN (GLUCOPHAGE-XR) 500 MG 24 hr tablet TAKE 1 TABLET(500 MG) BY MOUTH DAILY WITH BREAKFAST 90 tablet 1   OneTouch Delica Lancets [id number] by Does not apply route.     polyethylene glycol (MIRALAX / GLYCOLAX) 17 g packet Take 17 g by mouth daily.     Semaglutide, 1 MG/DOSE, (OZEMPIC, 1 MG/DOSE,) 4 MG/3ML SOPN Inject 1 mg into the skin once a week. 3 mL 0   cyanocobalamin 1000 MCG tablet Take 1 tablet by mouth daily. (Patient not taking: Reported on 01/31/2022)     No current facility-administered medications on  file prior to visit.    Social History   Tobacco Use   Smoking status: Never    Passive exposure: Past   Smokeless tobacco: Never  Vaping Use   Vaping Use: Never used  Substance Use Topics   Alcohol use: Yes    Comment: rare   Drug use: Never    Review of Systems  Constitutional:  Negative for chills and fever.  Respiratory:  Negative for cough.   Cardiovascular:  Negative for chest pain and palpitations.  Gastrointestinal:  Negative for nausea and vomiting.      Objective:    BP 118/70 (BP Location: Left Arm, Patient Position: Sitting, Cuff Size: Small)   Pulse 70   Temp 98.2 F (36.8 C) (Oral)   Ht 5' 4"  (1.626 m)   Wt 139 lb 9.6 oz (63.3 kg)   SpO2 98%   BMI 23.96 kg/m  BP Readings from Last 3 Encounters:  04/27/22 118/70  01/31/22 138/78  09/25/21 138/86   Wt Readings from Last 3  Encounters:  04/27/22 139 lb 9.6 oz (63.3 kg)  03/23/22 142 lb (64.4 kg)  01/31/22 142 lb 12.8 oz (64.8 kg)    Physical Exam Vitals reviewed.  Constitutional:      Appearance: She is well-developed.  Eyes:     Conjunctiva/sclera: Conjunctivae normal.  Cardiovascular:     Rate and Rhythm: Normal rate and regular rhythm.     Pulses: Normal pulses.     Heart sounds: Normal heart sounds.  Pulmonary:     Effort: Pulmonary effort is normal.     Breath sounds: Normal breath sounds. No wheezing, rhonchi or rales.  Skin:    General: Skin is warm and dry.  Neurological:     Mental Status: She is alert.  Psychiatric:        Speech: Speech normal.        Behavior: Behavior normal.        Thought Content: Thought content normal.        Assessment & Plan:   Problem List Items Addressed This Visit       Cardiovascular and Mediastinum   Essential hypertension - Primary    Chronic, stable.  Continue ramipril 56m, amlodipine 571m     Relevant Medications   ramipril (ALTACE) 10 MG capsule     Digestive   Gastroesophageal reflux disease    No alarm features at this time.  Discussed long-term side effects of PPIs and advised weaning to omeprazole 20 mg and then ultimately off medication would be reasonable.  She will let me know how she is doing.      Relevant Medications   omeprazole (PRILOSEC) 20 MG capsule     Endocrine   Type 2 diabetes mellitus without complication, without long-term current use of insulin (HCC)    Lab Results  Component Value Date   HGBA1C 5.7 (A) 01/31/2022  Chronic, excellent control. Continue Ozempic 1 mg, metformin 500 mg      Relevant Medications   ramipril (ALTACE) 10 MG capsule     I have changed DoDonnieoutwell's omeprazole. I am also having her start on ramipril. Additionally, I am having her maintain her aspirin EC, ONE TOUCH ULTRA 2, OneTouch Delica Lancets 3339Qpolyethylene glycol, cyanocobalamin, acetaminophen, Ozempic (1 MG/DOSE),  glucose blood, cyclobenzaprine, metFORMIN, atorvastatin, and amLODipine.   Meds ordered this encounter  Medications   ramipril (ALTACE) 10 MG capsule    Sig: Take 2 capsules (20 mg total) by mouth daily.  Dispense:  120 capsule    Refill:  1    Order Specific Question:   Supervising Provider    Answer:   Deborra Medina L [[id number]]   omeprazole (PRILOSEC) 20 MG capsule    Sig: Take 1 capsule (20 mg total) by mouth daily.    Dispense:  180 capsule    Refill:  0    Order Specific Question:   Supervising Provider    Answer:   [name] [[id number]]    Return precautions given.   Risks, benefits, and alternatives of the medications and treatment plan prescribed today were discussed, and patient expressed understanding.   Education regarding symptom management and diagnosis given to patient on AVS.  Continue to follow with Burnard Hawthorne, FNP for routine health maintenance.   [name] and I agreed with plan.   Mable Paris, FNP

## 2022-04-27 NOTE — Assessment & Plan Note (Signed)
Chronic, stable.  Continue ramipril 20 mg, amlodipine 5 mg 

## 2022-04-27 NOTE — Assessment & Plan Note (Signed)
Lab Results  Component Value Date   HGBA1C 5.7 (A) 01/31/2022   Chronic, excellent control. Continue Ozempic 1 mg, metformin 500 mg

## 2022-04-27 NOTE — Assessment & Plan Note (Signed)
No alarm features at this time.  Discussed long-term side effects of PPIs and advised weaning to omeprazole 20 mg and then ultimately off medication would be reasonable.  She will let me know how she is doing.

## 2022-04-27 NOTE — Patient Instructions (Addendum)
Try to wean down to omeprazole '20mg'$  as discussed  Long term use beyond 3 months of proton pump inhibitors ( PPI's) include Nexium, Prilosec, Protonix, Dexilant, and Prevacid.  Some medical studies have identified an association between the long-term use of PPIs and the development of numerous adverse conditions including intestinal infections, pneumonia, stomach cancer, osteoporosis-related bone fractures, chronic kidney disease, deficiencies of certain vitamins and minerals, heart attacks, strokes, dementia, and early death. Those studies have flaws, are not considered definitive, and do not establish a cause-and-effect relationship between PPIs and the adverse conditions. High-quality studies have found that PPIs do not significantly increase the risk of any of these conditions except intestinal infections. Nevertheless, we cannot exclude the possibility that PPIs might confer a small increase in the risk of developing these adverse conditions. For the treatment of GERD, gastroenterologists generally agree that the well-established benefits of PPIs far outweigh their theoretical risks.  Nonetheless I recommend trial wean off of PPI's  if uncomplicated acid reflux and use of histamine 2 blocker ( Pepcid AC).   Patients with history of esophagitis  or Barrett's esophagus should remain on PPI.   I generally recommend trying to control acid reflux with lifestyle modifications including avoiding trigger foods, not eating 2 hours prior to bedtime. You may use histamine 2 blockers daily to twice daily ( this is Pepcid) and then when symptoms flare, start back on PPI for short course.   Of note, we will need to do an endoscopy ( upper GI) to evaluate your esophagus, stomach in the future if acid reflux persists are you develop red flag symptoms: trouble swallowing, hoarseness, chronic cough, unexplained weight loss.

## 2022-06-05 ENCOUNTER — Telehealth (INDEPENDENT_AMBULATORY_CARE_PROVIDER_SITE_OTHER): Payer: Medicare Other | Admitting: Family Medicine

## 2022-06-05 ENCOUNTER — Other Ambulatory Visit: Payer: Self-pay | Admitting: Family Medicine

## 2022-06-05 DIAGNOSIS — R0981 Nasal congestion: Secondary | ICD-10-CM | POA: Diagnosis not present

## 2022-06-05 MED ORDER — FEXOFENADINE HCL 60 MG PO TABS: 60.0000 mg | ORAL_TABLET | Freq: Two times a day (BID) | ORAL | 1 refills | Status: DC

## 2022-06-05 MED ORDER — FLUTICASONE PROPIONATE 50 MCG/ACT NA SUSP: 2.0000 | Freq: Every day | NASAL | 1 refills | Status: DC

## 2022-06-05 NOTE — Progress Notes (Signed)
[facility] Telemedicine Visit  Patient consented to have virtual visit and was identified by name and date of birth. Method of visit: Video  Encounter participants: Patient: Tiffany Gregory - located at home Provider: Carollee Leitz - located at office Others (if applicable): none  Chief Complaint: sinus infection  HPI:  Reports sinus infection for 4 days.  Endorses pain over right eye, fatigue and nasal congestion. Denies any fevers, cough, headaches, rhinorrhea.  Has use Tylenol Sinus which has helped.  Also used Netty rise last night which she has notice has improved symptoms.  Reports has seen ENT in past and was recommended to use Netty rinse.    ROS: per HPI  Pertinent PMHx:  Nasal surgery Nasal fracture surgery Craniectomy   Exam:  There were no vitals taken for this visit.  Respiratory: Speaking in full sentences.  No increased work of breathing, nasal congestion or cough present during visit.    Assessment/Plan:  Nasal congestion Suspect increase in environmental allergens.  Less likely bacterial sinusitis given no fevers and symptoms started 4 days ago -Start Allegra 60 mg BID -Start Flonase 2 sprays daily -Continue Netty Rinse as needed -If symptoms worsen or fevers develop patient to notify MD and can reevaluate need for antibiotic therapy.   PDMP Reviewed  Time spent during visit with patient: 20 minutes

## 2022-06-05 NOTE — Patient Instructions (Addendum)
It was a pleasure meeting you today. Thank you for allowing me to take part in your health care.  Our goals for today as we discussed include:  For your sinuses Start Flonase 2 sprays to each nostril daily Start Allegra 60 mg twice a day Continue Tylenol Sinus as needed Continue Nasal Rinse  Recommend Shingles vaccine.  This is a 2 dose series and can be given at your local pharmacy.  Please talk to your pharmacist about this.   Recommend Flu vaccine Recommend Pneumonia 20 vaccine  Please follow-up with PCP as needed or if symptoms  If you have any questions or concerns, please do not hesitate to call the office at [phone].  I look forward to our next visit and until then take care and stay safe.  Regards,   Carollee Leitz, MD   [facility] [place]   Sinus Infection, Adult A sinus infection is soreness and swelling (inflammation) of your sinuses. Sinuses are hollow spaces in the bones around your face. They are located: Around your eyes. In the middle of your forehead. Behind your nose. In your cheekbones. Your sinuses and nasal passages are lined with a fluid called mucus. Mucus drains out of your sinuses. Swelling can trap mucus in your sinuses. This lets germs (bacteria, virus, or fungus) grow, which leads to infection. Most of the time, this condition is caused by a virus. What are the causes? Allergies. Asthma. Germs. Things that block your nose or sinuses. Growths in the nose (nasal polyps). Chemicals or irritants in the air. A fungus. This is rare. What increases the risk? Having a weak body defense system (immune system). Doing a lot of swimming or diving. Using nasal sprays too much. Smoking. What are the signs or symptoms? The main symptoms of this condition are pain and a feeling of pressure around the sinuses. Other symptoms include: Stuffy nose (congestion). This may make it hard to breathe through your nose. Runny nose (drainage). Soreness,  swelling, and warmth in the sinuses. A cough that may get worse at night. Being unable to smell and taste. Mucus that collects in the throat or the back of the nose (postnasal drip). This may cause a sore throat or bad breath. Being very tired (fatigued). A fever. How is this diagnosed? Your symptoms. Your medical history. A physical exam. Tests to find out if your condition is short-term (acute) or long-term (chronic). Your doctor may: Check your nose for growths (polyps). Check your sinuses using a tool that has a light on one end (endoscope). Check for allergies or germs. Do imaging tests, such as an MRI or CT scan. How is this treated? Treatment for this condition depends on the cause and whether it is short-term or long-term. If caused by a virus, your symptoms should go away on their own within 10 days. You may be given medicines to relieve symptoms. They include: Medicines that shrink swollen tissue in the nose. A spray that treats swelling of the nostrils. Rinses that help get rid of thick mucus in your nose (nasal saline washes). Medicines that treat allergies (antihistamines). Over-the-counter pain relievers. If caused by bacteria, your doctor may wait to see if you will get better without treatment. You may be given antibiotic medicine if you have: A very bad infection. A weak body defense system. If caused by growths in the nose, surgery may be needed. Follow these instructions at home: Medicines Take, use, or apply over-the-counter and prescription medicines only as told by your doctor.  These may include nasal sprays. If you were prescribed an antibiotic medicine, take it as told by your doctor. Do not stop taking it even if you start to feel better. Hydrate and humidify  Drink enough water to keep your pee (urine) pale yellow. Use a cool mist humidifier to keep the humidity level in your home above 50%. Breathe in steam for 10-15 minutes, 3-4 times a day, or as told  by your doctor. You can do this in the bathroom while a hot shower is running. Try not to spend time in cool or dry air. Rest Rest as much as you can. Sleep with your head raised (elevated). Make sure you get enough sleep each night. General instructions  Put a warm, moist washcloth on your face 3-4 times a day, or as often as told by your doctor. Use nasal saline washes as often as told by your doctor. Wash your hands often with soap and water. If you cannot use soap and water, use hand sanitizer. Do not smoke. Avoid being around people who are smoking (secondhand smoke). Keep all follow-up visits. Contact a doctor if: You have a fever. Your symptoms get worse. Your symptoms do not get better within 10 days. Get help right away if: You have a very bad headache. You cannot stop vomiting. You have very bad pain or swelling around your face or eyes. You have trouble seeing. You feel confused. Your neck is stiff. You have trouble breathing. These symptoms may be an emergency. Get help right away. Call 911. Do not wait to see if the symptoms will go away. Do not drive yourself to the hospital. Summary A sinus infection is swelling of your sinuses. Sinuses are hollow spaces in the bones around your face. This condition is caused by tissues in your nose that become inflamed or swollen. This traps germs. These can lead to infection. If you were prescribed an antibiotic medicine, take it as told by your doctor. Do not stop taking it even if you start to feel better. Keep all follow-up visits. This information is not intended to replace advice given to you by your health care provider. Make sure you discuss any questions you have with your health care provider. Document Revised: 08/29/2021 Document Reviewed: 08/29/2021 Elsevier Patient Education  [place].

## 2022-06-12 ENCOUNTER — Encounter: Payer: Self-pay | Admitting: Family Medicine

## 2022-06-12 NOTE — Assessment & Plan Note (Signed)
Suspect increase in environmental allergens.  Less likely bacterial sinusitis given no fevers and symptoms started 4 days ago -Start Allegra 60 mg BID -Start Flonase 2 sprays daily -Continue Netty Rinse as needed -If symptoms worsen or fevers develop patient to notify MD and can reevaluate need for antibiotic therapy.

## 2022-06-19 ENCOUNTER — Other Ambulatory Visit: Payer: Self-pay

## 2022-06-19 DIAGNOSIS — K219 Gastro-esophageal reflux disease without esophagitis: Secondary | ICD-10-CM

## 2022-06-19 MED ORDER — OMEPRAZOLE 20 MG PO CPDR: 20.0000 mg | DELAYED_RELEASE_CAPSULE | Freq: Every day | ORAL | 1 refills | Status: DC

## 2022-06-19 MED ORDER — AMLODIPINE BESYLATE 5 MG PO TABS: 5.0000 mg | ORAL_TABLET | Freq: Every day | ORAL | 3 refills | Status: DC

## 2022-07-12 ENCOUNTER — Other Ambulatory Visit: Payer: Self-pay

## 2022-07-12 DIAGNOSIS — E119 Type 2 diabetes mellitus without complications: Secondary | ICD-10-CM

## 2022-07-12 MED ORDER — ONETOUCH ULTRA 2 W/DEVICE KIT: 1.0000 | PACK | Freq: Every day | 0 refills | Status: AC

## 2022-07-13 ENCOUNTER — Telehealth: Payer: Self-pay | Admitting: Pharmacy Technician

## 2022-07-13 ENCOUNTER — Encounter: Payer: Self-pay | Admitting: Family

## 2022-07-13 DIAGNOSIS — Z596 Low income: Secondary | ICD-10-CM

## 2022-07-13 NOTE — Progress Notes (Addendum)
[facility])                                            Baker Team    07/13/2022  Tiffany Gregory [birth year] [id number]                                      Medication Assistance Referral-FOR 2024 RE ENROLLMENT  Referral From:  [facility]  Kristeen Miss  Medication/Company: Tiffany Gregory / [employer] Patient application portion:  Education officer, museum portion: Faxed  to Tiffany Paris, FNP Provider address/fax verified via: Office website  [name] P. [name], [place]  [phone]

## 2022-07-16 ENCOUNTER — Other Ambulatory Visit: Payer: Self-pay | Admitting: Family

## 2022-07-16 DIAGNOSIS — I10 Essential (primary) hypertension: Secondary | ICD-10-CM

## 2022-07-16 MED ORDER — RAMIPRIL 10 MG PO CAPS: 20.0000 mg | ORAL_CAPSULE | Freq: Every day | ORAL | 3 refills | Status: DC

## 2022-07-17 ENCOUNTER — Telehealth: Payer: Self-pay

## 2022-07-17 ENCOUNTER — Telehealth: Payer: Self-pay | Admitting: Family

## 2022-07-17 NOTE — Telephone Encounter (Signed)
Pt came in today & picked up Ozempic from patient assistance at 12:30p.

## 2022-07-17 NOTE — Telephone Encounter (Signed)
Called Pt to let her know that Patient Assistance medications were delivered to office.  Ozempic '4mg'$ /59m x 4 boxes.  Pt will pick up medications today.

## 2022-08-07 ENCOUNTER — Ambulatory Visit: Payer: Medicare Other | Admitting: Family

## 2022-08-09 ENCOUNTER — Ambulatory Visit (INDEPENDENT_AMBULATORY_CARE_PROVIDER_SITE_OTHER): Payer: Medicare Other | Admitting: Family

## 2022-08-09 ENCOUNTER — Encounter: Payer: Self-pay | Admitting: Family

## 2022-08-09 VITALS — BP 120/78 | HR 81 | Temp 98.4°F | Ht 65.0 in | Wt 138.8 lb

## 2022-08-09 DIAGNOSIS — G510 Bell's palsy: Secondary | ICD-10-CM

## 2022-08-09 DIAGNOSIS — I10 Essential (primary) hypertension: Secondary | ICD-10-CM | POA: Diagnosis not present

## 2022-08-09 DIAGNOSIS — Z23 Encounter for immunization: Secondary | ICD-10-CM

## 2022-08-09 DIAGNOSIS — E119 Type 2 diabetes mellitus without complications: Secondary | ICD-10-CM

## 2022-08-09 LAB — BASIC METABOLIC PANEL
BUN: 11 mg/dL (ref 6–23)
CO2: 30 mEq/L (ref 19–32)
Calcium: 9.1 mg/dL (ref 8.4–10.5)
Chloride: 100 mEq/L (ref 96–112)
Creatinine, Ser: 0.66 mg/dL (ref 0.40–1.20)
GFR: 88.77 mL/min (ref >60.00)
Glucose, Bld: 89 mg/dL (ref 70–99)
Potassium: 4.2 mEq/L (ref 3.5–5.1)
Sodium: 137 mEq/L (ref 135–145)

## 2022-08-09 LAB — POCT GLYCOSYLATED HEMOGLOBIN (HGB A1C): Hemoglobin A1C: 5.5 % (ref 4.0–5.6)

## 2022-08-09 NOTE — Assessment & Plan Note (Signed)
Lab Results  Component Value Date   HGBA1C 5.5 08/09/2022   Excellent control.  Normal BMI.  Advised her to stop Ozempic.  Continue metformin 500 mg daily for maintenance.

## 2022-08-09 NOTE — Progress Notes (Signed)
Subjective:    Patient ID: Tiffany Gregory, female    DOB: January 18, 1951, 71 y.o.   MRN: [id number]  CC: Tiffany Gregory is a 71 y.o. female who presents today for follow up.   HPI:   She feels a 'twitching, involuntary' , couple of months. Episodic, multiple times per day. Not worse at night.   She feels that vision in left eye can be more blurry, no sudden changes or vision loss, blurry vision. No associated facial numbness, HA, vomiting, rash.   She spends 6 hours in front of computer.   She is using nasonex. She is not taking allegra.   History of Bell's palsy left eye  She had rescheduled eye exam, 09/12/22 with ophthalmologist, Dr Edison Pace.   She has seasonal allergies.   She is using pataday , eye exercises as recommended by neurology, and using dry eye drops.   She wears glasses and thinks that she is due for new glasses, as prescription hasn't changed in years.      Follow up with neurology 02/12/22 , consider repeat MRI brain.  She has follow-up neurology again next week.   HTN- Compliant with ramipril 20 mg, amlodipine 5 mg DM- compliant with Ozempic 1 mg, metformin 500 mg   Never smoker  MRI brain 08/22/21  Due pcv20   HISTORY:  Past Medical History:  Diagnosis Date   Allergy    Asthma    Diabetes mellitus without complication (HCC)    Elevated liver enzymes    Hepatic steatosis    History of hepatitis B 1971   She had IV drug use experimentation and was hosptialized and reports treated.    Hyperlipidemia    Hypertension    Past Surgical History:  Procedure Laterality Date   ABDOMINAL HYSTERECTOMY     BREAST BIOPSY     CRANIECTOMY  10/09/2019   slipped on ice, hematoma   NASAL FRACTURE SURGERY  1975   NASAL SINUS SURGERY  2010   Family History  Problem Relation Age of Onset   Diabetes Mother    Heart attack Mother    Diabetes Father    Heart disease Father    Hypertension Father    Diabetes Sister    COPD Sister    Heart disease Sister     Diabetes Brother    Heart attack Maternal Grandmother    Cancer Maternal Grandfather    Heart attack Paternal Grandmother    Cancer Paternal Grandfather    Heart attack Brother    Heart disease Brother     Allergies: IT sales professional, Keflex [cephalexin], [name], Sulfa antibiotics, and Valsartan Current Outpatient Medications on File Prior to Visit  Medication Sig Dispense Refill   acetaminophen (TYLENOL) 325 MG tablet Take 650 mg by mouth every 6 (six) hours as needed.     amLODipine (NORVASC) 5 MG tablet Take 1 tablet (5 mg total) by mouth daily. 90 tablet 3   aspirin EC 81 MG tablet Take 81 mg by mouth daily. Swallow whole.     atorvastatin (LIPITOR) 40 MG tablet Take 1 tablet (40 mg total) by mouth daily. 90 tablet 3   Blood Glucose Monitoring Suppl (ONE TOUCH ULTRA 2) w/Device KIT 1 Device by Does not apply route daily. 1 kit 0   cyclobenzaprine (FLEXERIL) 10 MG tablet Take 1 tablet (10 mg total) by mouth at bedtime. 30 tablet 0   fluticasone (FLONASE) 50 MCG/ACT nasal spray SHAKE LIQUID AND USE 2 SPRAYS IN EACH NOSTRIL DAILY 48 g  3   glucose blood test strip 1 each by Other route as needed for other. Use as instructed. One touch ultra blue in vitro strips 100 each 3   metFORMIN (GLUCOPHAGE-XR) 500 MG 24 hr tablet TAKE 1 TABLET(500 MG) BY MOUTH DAILY WITH BREAKFAST 90 tablet 1   omeprazole (PRILOSEC) 20 MG capsule Take 1 capsule (20 mg total) by mouth daily. 180 capsule 1   OneTouch Delica Lancets 03T MISC by Does not apply route.     polyethylene glycol (MIRALAX / GLYCOLAX) 17 g packet Take 17 g by mouth daily.     ramipril (ALTACE) 10 MG capsule Take 2 capsules (20 mg total) by mouth daily. 180 capsule 3   No current facility-administered medications on file prior to visit.    Social History   Tobacco Use   Smoking status: Never    Passive exposure: Past   Smokeless tobacco: Never  Vaping Use   Vaping Use: Never used  Substance Use Topics   Alcohol use: Yes     Comment: rare   Drug use: Never    Review of Systems  Constitutional:  Negative for chills and fever.  Eyes:  Negative for photophobia, pain, discharge, redness, itching and visual disturbance.  Respiratory:  Negative for cough.   Cardiovascular:  Negative for chest pain and palpitations.  Gastrointestinal:  Negative for nausea and vomiting.  Neurological:  Negative for dizziness and headaches.      Objective:    BP 120/78 (BP Location: Left Arm, Patient Position: Sitting, Cuff Size: Normal)   Pulse 81   Temp 98.4 F (36.9 C) (Oral)   Ht _0  (1.651 m)   Wt 138 lb 12.8 oz (63 kg)   SpO2 98%   BMI 23.10 kg/m  BP Readings from Last 3 Encounters:  08/09/22 120/78  04/27/22 118/70  01/31/22 138/78   Wt Readings from Last 3 Encounters:  08/09/22 138 lb 12.8 oz (63 kg)  04/27/22 139 lb 9.6 oz (63.3 kg)  03/23/22 142 lb (64.4 kg)    Physical Exam Vitals reviewed.  Constitutional:      Appearance: She is well-developed.  HENT:     Mouth/Throat:     Pharynx: Uvula midline.  Eyes:     General: Lids are normal.        Right eye: No foreign body, discharge or hordeolum.        Left eye: No foreign body, discharge or hordeolum.     Extraocular Movements: Extraocular movements intact.     Conjunctiva/sclera: Conjunctivae normal.     Right eye: Right conjunctiva is not injected.     Left eye: Left conjunctiva is not injected.     Pupils: Pupils are equal, round, and reactive to light.     Comments: Fundus normal bilaterally. Patient blinking left eye appropriately. No dropped eyelid, twitching, tremor appreciated.   Cardiovascular:     Rate and Rhythm: Normal rate and regular rhythm.     Pulses: Normal pulses.     Heart sounds: Normal heart sounds.  Pulmonary:     Effort: Pulmonary effort is normal.     Breath sounds: Normal breath sounds. No wheezing, rhonchi or rales.  Skin:    General: Skin is warm and dry.  Neurological:     Mental Status: She is alert.      Cranial Nerves: No cranial nerve deficit.     Sensory: No sensory deficit.     Motor: No weakness.     Coordination: Coordination  is intact.     Gait: Gait is intact.     Deep Tendon Reflexes:     Reflex Scores:      Bicep reflexes are 2+ on the right side and 2+ on the left side.      Patellar reflexes are 2+ on the right side and 2+ on the left side.    Comments: Grip equal and strong bilateral upper extremities. Gait strong and steady. Able to perform rapid alternating movement without difficulty.   Psychiatric:        Speech: Speech normal.        Behavior: Behavior normal.        Thought Content: Thought content normal.        Assessment & Plan:   Problem List Items Addressed This Visit       Cardiovascular and Mediastinum   Essential hypertension    Chronic, stable.  Continue ramipril 20 mg, amlodipine 5 mg      Relevant Orders   Basic metabolic panel     Endocrine   Type 2 diabetes mellitus without complication, without long-term current use of insulin ([place]) - Primary    Lab Results  Component Value Date   HGBA1C 5.5 08/09/2022  Excellent control.  Normal BMI.  Advised her to stop Ozempic.  Continue metformin 500 mg daily for maintenance.      Relevant Orders   POCT HgB A1C (Completed)     Nervous and Auditory   Bell's palsy    Reassuring eye exam, neurologic exam.  Due to history of Bell's palsy, advised patient MRI of the brain to evaluate for Bell's palsy and rule out any concern for CVA.  She politely declines today as she like discuss with neurology next week.  Discussed alarm features including vision changes , vision loss,  numbness of the face, headache in which to present to the emergency room for emergent imaging.  She has follow-up with ophthalmology.  Regimen currently includes eyedrops for dry eye, Pataday.  Advised to continue.  She will let me know how she is doing      Other Visit Diagnoses     Need for pneumococcal 20-valent conjugate  vaccination       Relevant Orders   Pneumococcal conjugate vaccine 20-valent (Prevnar 20) (Completed)   Need for immunization against influenza       Relevant Orders   Flu Vaccine QUAD 50moIM (Fluarix, Fluzone & Alfiuria Quad PF) (Completed)        I have discontinued DButch PennyBoutwell's Ozempic (1 MG/DOSE) and fexofenadine. I am also having her maintain her aspirin EC, OneTouch Delica Lancets 382N polyethylene glycol, acetaminophen, glucose blood, cyclobenzaprine, metFORMIN, atorvastatin, fluticasone, omeprazole, amLODipine, ONE TOUCH ULTRA 2, and ramipril.   No orders of the defined types were placed in this encounter.   Return precautions given.   Risks, benefits, and alternatives of the medications and treatment plan prescribed today were discussed, and patient expressed understanding.   Education regarding symptom management and diagnosis given to patient on AVS.  Continue to follow with ABurnard Hawthorne FNP for routine health maintenance.   DSmith Robertand I agreed with plan.   MMable Paris FNP

## 2022-08-09 NOTE — Assessment & Plan Note (Signed)
Reassuring eye exam, neurologic exam.  Due to history of Bell's palsy, advised patient MRI of the brain to evaluate for Bell's palsy and rule out any concern for CVA.  She politely declines today as she like discuss with neurology next week.  Discussed alarm features including vision changes , vision loss,  numbness of the face, headache in which to present to the emergency room for emergent imaging.  She has follow-up with ophthalmology.  Regimen currently includes eyedrops for dry eye, Pataday.  Advised to continue.  She will let me know how she is doing

## 2022-08-09 NOTE — Patient Instructions (Signed)
Please stay vigilant as it relates to symptoms.  Any sudden vision changes, eyelid drooping, headache numbness of the face, would warrant emergent evaluation in the emergency room. As discussed, please stop Ozempic. Nice to see you today

## 2022-08-09 NOTE — Assessment & Plan Note (Signed)
Chronic, stable.  Continue ramipril 20 mg, amlodipine 5 mg

## 2022-08-17 ENCOUNTER — Encounter: Payer: Self-pay | Admitting: Student

## 2022-08-17 ENCOUNTER — Other Ambulatory Visit: Payer: Self-pay | Admitting: Student

## 2022-08-17 ENCOUNTER — Other Ambulatory Visit: Payer: Self-pay | Admitting: Neurology

## 2022-08-20 ENCOUNTER — Encounter: Payer: Self-pay | Admitting: Family

## 2022-08-21 ENCOUNTER — Other Ambulatory Visit: Payer: Self-pay | Admitting: Student

## 2022-08-21 ENCOUNTER — Encounter: Payer: Self-pay | Admitting: Family

## 2022-08-21 DIAGNOSIS — M7989 Other specified soft tissue disorders: Secondary | ICD-10-CM

## 2022-08-22 ENCOUNTER — Other Ambulatory Visit: Payer: Self-pay | Admitting: Family

## 2022-08-22 ENCOUNTER — Other Ambulatory Visit: Payer: Self-pay | Admitting: Student

## 2022-08-22 DIAGNOSIS — G510 Bell's palsy: Secondary | ICD-10-CM

## 2022-08-22 DIAGNOSIS — L299 Pruritus, unspecified: Secondary | ICD-10-CM

## 2022-08-22 DIAGNOSIS — H02402 Unspecified ptosis of left eyelid: Secondary | ICD-10-CM

## 2022-08-22 MED ORDER — HYDROXYZINE HCL 10 MG PO TABS: 10.0000 mg | ORAL_TABLET | Freq: Two times a day (BID) | ORAL | 1 refills | Status: DC | PRN

## 2022-08-28 ENCOUNTER — Ambulatory Visit
Admission: RE | Admit: 2022-08-28 | Discharge: 2022-08-28 | Disposition: A | Discharge status: Home / Self Care | Payer: Medicare Other | Source: Ambulatory Visit | Attending: Student | Admitting: Student

## 2022-08-28 DIAGNOSIS — M7989 Other specified soft tissue disorders: Secondary | ICD-10-CM | POA: Diagnosis present

## 2022-09-03 ENCOUNTER — Encounter: Payer: Self-pay | Admitting: Family

## 2022-09-05 ENCOUNTER — Ambulatory Visit
Admission: RE | Admit: 2022-09-05 | Discharge: 2022-09-05 | Disposition: A | Discharge status: Home / Self Care | Payer: Medicare Other | Source: Ambulatory Visit | Attending: Student | Admitting: Student

## 2022-09-05 DIAGNOSIS — G510 Bell's palsy: Secondary | ICD-10-CM | POA: Diagnosis present

## 2022-09-05 DIAGNOSIS — H02402 Unspecified ptosis of left eyelid: Secondary | ICD-10-CM | POA: Insufficient documentation

## 2022-09-05 MED ORDER — GADOBUTROL 1 MMOL/ML IV SOLN
6.0000 mL | Freq: Once | INTRAVENOUS | Status: AC | PRN
  Administered 2022-09-05: 6 mL via INTRAVENOUS

## 2022-09-12 DIAGNOSIS — G510 Bell's palsy: Secondary | ICD-10-CM | POA: Diagnosis not present

## 2022-09-13 DIAGNOSIS — E119 Type 2 diabetes mellitus without complications: Secondary | ICD-10-CM | POA: Diagnosis not present

## 2022-09-13 DIAGNOSIS — H02402 Unspecified ptosis of left eyelid: Secondary | ICD-10-CM | POA: Diagnosis not present

## 2022-09-13 DIAGNOSIS — M7989 Other specified soft tissue disorders: Secondary | ICD-10-CM | POA: Diagnosis not present

## 2022-09-13 DIAGNOSIS — G510 Bell's palsy: Secondary | ICD-10-CM | POA: Diagnosis not present

## 2022-09-13 DIAGNOSIS — E785 Hyperlipidemia, unspecified: Secondary | ICD-10-CM | POA: Diagnosis not present

## 2022-09-13 DIAGNOSIS — S065XAA Traumatic subdural hemorrhage with loss of consciousness status unknown, initial encounter: Secondary | ICD-10-CM | POA: Diagnosis not present

## 2022-09-13 DIAGNOSIS — E1169 Type 2 diabetes mellitus with other specified complication: Secondary | ICD-10-CM | POA: Diagnosis not present

## 2022-10-17 ENCOUNTER — Telehealth: Payer: Self-pay | Admitting: Family

## 2022-10-17 NOTE — Telephone Encounter (Signed)
Noted  

## 2022-10-17 NOTE — Telephone Encounter (Signed)
Pt called staying she has an eye infection and would like to see her provider. I didn't see any openings only virtual for next week 01/17. However she its too far out. She said she will try UC or her eye dr.

## 2022-10-26 ENCOUNTER — Other Ambulatory Visit: Payer: Self-pay

## 2022-10-26 ENCOUNTER — Encounter: Payer: Self-pay | Admitting: Family

## 2022-10-26 DIAGNOSIS — E119 Type 2 diabetes mellitus without complications: Secondary | ICD-10-CM

## 2022-10-26 MED ORDER — METFORMIN HCL ER 500 MG PO TB24: ORAL_TABLET | ORAL | 1 refills | Status: DC

## 2022-11-09 ENCOUNTER — Ambulatory Visit: Payer: Medicare Other | Admitting: Family

## 2022-11-12 ENCOUNTER — Ambulatory Visit (INDEPENDENT_AMBULATORY_CARE_PROVIDER_SITE_OTHER): Payer: Medicare Other | Admitting: Family

## 2022-11-12 ENCOUNTER — Encounter: Payer: Self-pay | Admitting: Family

## 2022-11-12 VITALS — BP 118/78 | HR 70 | Temp 98.1°F | Ht 64.0 in | Wt 147.6 lb

## 2022-11-12 DIAGNOSIS — I10 Essential (primary) hypertension: Secondary | ICD-10-CM

## 2022-11-12 DIAGNOSIS — E119 Type 2 diabetes mellitus without complications: Secondary | ICD-10-CM

## 2022-11-12 DIAGNOSIS — R600 Localized edema: Secondary | ICD-10-CM | POA: Diagnosis not present

## 2022-11-12 DIAGNOSIS — R7309 Other abnormal glucose: Secondary | ICD-10-CM

## 2022-11-12 LAB — POCT GLYCOSYLATED HEMOGLOBIN (HGB A1C): Hemoglobin A1C: 6.1 % — AB (ref 4.0–5.6)

## 2022-11-12 NOTE — Patient Instructions (Addendum)
Decrease amlodipine to 2.'5mg'$  to see if leg swelling improves.   Monitor blood pressure as may need to escalate ramipril.

## 2022-11-12 NOTE — Progress Notes (Signed)
Assessment & Plan:  Elevated glucose -     POCT glycosylated hemoglobin (Hb A1C)  Type 2 diabetes mellitus without complication, without long-term current use of insulin (HCC) Assessment & Plan: Excellent control without ozempic. Continue metformin '1000mg'$  bid   Essential hypertension Assessment & Plan: Excellent control. Concerned that amlodipine contributing to pedal edema.  Decrease amlodipine to 2.'5mg'$  , continue ramipril '10mg'$  qd.  Encouraged low sodium diet, elevation.       Return precautions given.   Risks, benefits, and alternatives of the medications and treatment plan prescribed today were discussed, and patient expressed understanding.   Education regarding symptom management and diagnosis given to patient on AVS either electronically or printed.  No follow-ups on file.  Mable Paris, FNP  Subjective:    Patient ID: Tiffany Gregory, female    DOB: [birth year], 72 y.o.   MRN: [id number]  CC: Tiffany Gregory is a 72 y.o. female who presents today for follow up.   HPI: Feels well today No new complaints  She continues to have left ankle swelling, worse as the days goes on. Improves with elevation  [place] left leg negative dvt 08/30/22      Allergies: Lemon flavor, Keflex [cephalexin], [name], Sulfa antibiotics, and Valsartan Current Outpatient Medications on File Prior to Visit  Medication Sig Dispense Refill   acetaminophen (TYLENOL) 325 MG tablet Take 650 mg by mouth every 6 (six) hours as needed.     amLODipine (NORVASC) 5 MG tablet Take 1 tablet (5 mg total) by mouth daily. 90 tablet 3   aspirin EC 81 MG tablet Take 81 mg by mouth daily. Swallow whole.     atorvastatin (LIPITOR) 40 MG tablet Take 1 tablet (40 mg total) by mouth daily. 90 tablet 3   Blood Glucose Monitoring Suppl (ONE TOUCH ULTRA 2) w/Device KIT 1 Device by Does not apply route daily. 1 kit 0   cyclobenzaprine (FLEXERIL) 10 MG tablet Take 1 tablet (10 mg total) by mouth at  bedtime. 30 tablet 0   fluticasone (FLONASE) 50 MCG/ACT nasal spray SHAKE LIQUID AND USE 2 SPRAYS IN EACH NOSTRIL DAILY 48 g 3   glucose blood test strip 1 each by Other route as needed for other. Use as instructed. One touch ultra blue in vitro strips 100 each 3   hydrOXYzine (ATARAX) 10 MG tablet Take 1 tablet (10 mg total) by mouth 2 (two) times daily as needed for itching. 60 tablet 1   metFORMIN (GLUCOPHAGE-XR) 500 MG 24 hr tablet TAKE 1 TABLET(500 MG) BY MOUTH DAILY WITH BREAKFAST 90 tablet 1   omeprazole (PRILOSEC) 20 MG capsule Take 1 capsule (20 mg total) by mouth daily. 180 capsule 1   OneTouch Delica Lancets 77L MISC by Does not apply route.     polyethylene glycol (MIRALAX / GLYCOLAX) 17 g packet Take 17 g by mouth daily.     ramipril (ALTACE) 10 MG capsule Take 2 capsules (20 mg total) by mouth daily. 180 capsule 3   No current facility-administered medications on file prior to visit.    Review of Systems  Constitutional:  Negative for chills and fever.  Respiratory:  Negative for cough.   Cardiovascular:  Positive for leg swelling. Negative for chest pain and palpitations.  Gastrointestinal:  Negative for nausea and vomiting.      Objective:    BP 118/78   Pulse 70   Temp 98.1 F (36.7 C) (Oral)   Ht '5\' 4"'$  (1.626 m)   Wt 147 lb  9.6 oz (67 kg)   SpO2 99%   BMI 25.34 kg/m  BP Readings from Last 3 Encounters:  11/12/22 118/78  08/09/22 120/78  04/27/22 118/70   Wt Readings from Last 3 Encounters:  11/12/22 147 lb 9.6 oz (67 kg)  08/09/22 138 lb 12.8 oz (63 kg)  04/27/22 139 lb 9.6 oz (63.3 kg)    Physical Exam Vitals reviewed.  Constitutional:      Appearance: She is well-developed.  Eyes:     Conjunctiva/sclera: Conjunctivae normal.  Cardiovascular:     Rate and Rhythm: Normal rate and regular rhythm.     Pulses: Normal pulses.     Heart sounds: Normal heart sounds.     Comments: Trace non pitting LLE.  No palpable cords or masses. No erythema or  increased warmth. No asymmetry in calf size when compared bilaterally LE hair growth symmetric and present. No discoloration or varicosities noted. LE warm and palpable pedal pulses.  Pulmonary:     Effort: Pulmonary effort is normal.     Breath sounds: Normal breath sounds. No wheezing, rhonchi or rales.  Musculoskeletal:     Left lower leg: Edema present.  Skin:    General: Skin is warm and dry.  Neurological:     Mental Status: She is alert.  Psychiatric:        Speech: Speech normal.        Behavior: Behavior normal.        Thought Content: Thought content normal.

## 2022-11-12 NOTE — Assessment & Plan Note (Signed)
Excellent control without ozempic. Continue metformin '1000mg'$  bid

## 2022-11-12 NOTE — Assessment & Plan Note (Signed)
Excellent control. Concerned that amlodipine contributing to pedal edema.  Decrease amlodipine to 2.'5mg'$  , continue ramipril '10mg'$  qd.  Encouraged low sodium diet, elevation.

## 2022-12-06 ENCOUNTER — Ambulatory Visit: Payer: Medicare Other | Admitting: Dermatology

## 2022-12-06 VITALS — BP 126/69 | HR 79

## 2022-12-06 DIAGNOSIS — L814 Other melanin hyperpigmentation: Secondary | ICD-10-CM | POA: Diagnosis not present

## 2022-12-06 DIAGNOSIS — D229 Melanocytic nevi, unspecified: Secondary | ICD-10-CM | POA: Diagnosis not present

## 2022-12-06 DIAGNOSIS — L821 Other seborrheic keratosis: Secondary | ICD-10-CM

## 2022-12-06 DIAGNOSIS — D1801 Hemangioma of skin and subcutaneous tissue: Secondary | ICD-10-CM

## 2022-12-06 DIAGNOSIS — L820 Inflamed seborrheic keratosis: Secondary | ICD-10-CM | POA: Diagnosis not present

## 2022-12-06 DIAGNOSIS — L578 Other skin changes due to chronic exposure to nonionizing radiation: Secondary | ICD-10-CM | POA: Diagnosis not present

## 2022-12-06 NOTE — Progress Notes (Signed)
   Follow-Up Visit   Subjective  Tiffany Gregory is a 72 y.o. female who presents for the following: Irregular skin lesion (On the R cheek, back, and side - changing in appearance, itching, patient is concerned and would like it checked today ). The patient has spots, moles and lesions to be evaluated, some may be new or changing and the patient has concerns that these could be cancer.  The following portions of the chart were reviewed this encounter and updated as appropriate:   Tobacco  Allergies  Meds  Problems  Med Hx  Surg Hx  Fam Hx     Review of Systems:  No other skin or systemic complaints except as noted in HPI or Assessment and Plan.  Objective  Well appearing patient in no apparent distress; mood and affect are within normal limits.  A focused examination was performed including the face and back. Relevant physical exam findings are noted in the Assessment and Plan.  R preauricular x 1, back x 2 (3) Erythematous stuck-on, waxy papule or plaque   Assessment & Plan  Inflamed seborrheic keratosis (3) R preauricular x 1, back x 2 Symptomatic, irritating, patient would like treated. Destruction of lesion - R preauricular x 1, back x 2 Complexity: simple   Destruction method: cryotherapy   Informed consent: discussed and consent obtained   Timeout:  patient name, date of birth, surgical site, and procedure verified Lesion destroyed using liquid nitrogen: Yes   Region frozen until ice ball extended beyond lesion: Yes   Outcome: patient tolerated procedure well with no complications   Post-procedure details: wound care instructions given    Seborrheic Keratoses - Stuck-on, waxy, tan-brown papules and/or plaques  - Benign-appearing - Discussed benign etiology and prognosis. - Observe - Call for any changes  Melanocytic Nevi - Tan-brown and/or pink-flesh-colored symmetric macules and papules - Benign appearing on exam today - Observation - Call clinic for new or  changing moles - Recommend daily use of broad spectrum spf 30+ sunscreen to sun-exposed areas.   Actinic Damage - chronic, secondary to cumulative UV radiation exposure/sun exposure over time - diffuse scaly erythematous macules with underlying dyspigmentation - Recommend daily broad spectrum sunscreen SPF 30+ to sun-exposed areas, reapply every 2 hours as needed.  - Recommend staying in the shade or wearing long sleeves, sun glasses (UVA+UVB protection) and wide brim hats (4-inch brim around the entire circumference of the hat). - Call for new or changing lesions.  Hemangiomas - Red papules - Discussed benign nature - Observe - Call for any changes  Lentigines - Scattered tan macules - Due to sun exposure - Benign-appearing, observe - Recommend daily broad spectrum sunscreen SPF 30+ to sun-exposed areas, reapply every 2 hours as needed. - Call for any changes  Return if symptoms worsen or fail to improve.  Luther Redo, CMA, am acting as scribe for Sarina Ser, MD . Documentation: I have reviewed the above documentation for accuracy and completeness, and I agree with the above.  Sarina Ser, MD

## 2022-12-06 NOTE — Patient Instructions (Signed)
Due to recent changes in healthcare laws, you may see results of your pathology and/or laboratory studies on MyChart before the doctors have had a chance to review them. We understand that in some cases there may be results that are confusing or concerning to you. Please understand that not all results are received at the same time and often the doctors may need to interpret multiple results in order to provide you with the best plan of care or course of treatment. Therefore, we ask that you please give us 2 business days to thoroughly review all your results before contacting the office for clarification. Should we see a critical lab result, you will be contacted sooner.   If You Need Anything After Your Visit  If you have any questions or concerns for your doctor, please call our main line at [phone] and press option 4 to reach your doctor's medical assistant. If no one answers, please leave a voicemail as directed and we will return your call as soon as possible. Messages left after 4 pm will be answered the following business day.   You may also send us a message via MyChart. We typically respond to MyChart messages within 1-2 business days.  For prescription refills, please ask your pharmacy to contact our office. Our fax number is [phone].  If you have an urgent issue when the clinic is closed that cannot wait until the next business day, you can page your doctor at the number below.    Please note that while we do our best to be available for urgent issues outside of office hours, we are not available 24/7.   If you have an urgent issue and are unable to reach us, you may choose to seek medical care at your doctor's office, retail clinic, urgent care center, or emergency room.  If you have a medical emergency, please immediately call 911 or go to the emergency department.  Pager Numbers  - Dr. Kowalski: [phone]  - Dr. Moye: [phone]  - Dr. Stewart:  [phone]  In the event of inclement weather, please call our main line at [phone] for an update on the status of any delays or closures.  Dermatology Medication Tips: Please keep the boxes that topical medications come in in order to help keep track of the instructions about where and how to use these. Pharmacies typically print the medication instructions only on the boxes and not directly on the medication tubes.   If your medication is too expensive, please contact our office at [phone] option 4 or send us a message through MyChart.   We are unable to tell what your co-pay for medications will be in advance as this is different depending on your insurance coverage. However, we may be able to find a substitute medication at lower cost or fill out paperwork to get insurance to cover a needed medication.   If a prior authorization is required to get your medication covered by your insurance company, please allow us 1-2 business days to complete this process.  Drug prices often vary depending on where the prescription is filled and some pharmacies may offer cheaper prices.  The website www.goodrx.com contains coupons for medications through different pharmacies. The prices here do not account for what the cost may be with help from insurance (it may be cheaper with your insurance), but the website can give you the price if you did not use any insurance.  - You can print the associated coupon and take it with   your prescription to the pharmacy.  - You may also stop by our office during regular business hours and pick up a GoodRx coupon card.  - If you need your prescription sent electronically to a different pharmacy, notify our office through [facility] MyChart or by phone at [phone] option 4.     Si Usted Necesita Algo Despus de Su Visita  Tambin puede enviarnos un mensaje a travs de MyChart. Por lo general respondemos a los mensajes de MyChart en el transcurso de 1 a 2  das hbiles.  Para renovar recetas, por favor pida a su farmacia que se ponga en contacto con nuestra oficina. Nuestro nmero de fax es el [phone].  Si tiene un asunto urgente cuando la clnica est cerrada y que no puede esperar hasta el siguiente da hbil, puede llamar/localizar a su doctor(a) al nmero que aparece a continuacin.   Por favor, tenga en cuenta que aunque hacemos todo lo posible para estar disponibles para asuntos urgentes fuera del horario de oficina, no estamos disponibles las 24 horas del da, los 7 das de la semana.   Si tiene un problema urgente y no puede comunicarse con nosotros, puede optar por buscar atencin mdica  en el consultorio de su doctor(a), en una clnica privada, en un centro de atencin urgente o en una sala de emergencias.  Si tiene una emergencia mdica, por favor llame inmediatamente al 911 o vaya a la sala de emergencias.  Nmeros de bper  - Dr. Kowalski: [phone]  - Dra. Moye: [phone]  - Dra. Stewart: [phone]  En caso de inclemencias del tiempo, por favor llame a nuestra lnea principal al [phone] para una actualizacin sobre el estado de cualquier retraso o cierre.  Consejos para la medicacin en dermatologa: Por favor, guarde las cajas en las que vienen los medicamentos de uso tpico para ayudarle a seguir las instrucciones sobre dnde y cmo usarlos. Las farmacias generalmente imprimen las instrucciones del medicamento slo en las cajas y no directamente en los tubos del medicamento.   Si su medicamento es muy caro, por favor, pngase en contacto con nuestra oficina llamando al [phone] y presione la opcin 4 o envenos un mensaje a travs de MyChart.   No podemos decirle cul ser su copago por los medicamentos por adelantado ya que esto es diferente dependiendo de la cobertura de su seguro. Sin embargo, es posible que podamos encontrar un medicamento sustituto a menor costo o llenar un formulario para que el  seguro cubra el medicamento que se considera necesario.   Si se requiere una autorizacin previa para que su compaa de seguros cubra su medicamento, por favor permtanos de 1 a 2 das hbiles para completar este proceso.  Los precios de los medicamentos varan con frecuencia dependiendo del lugar de dnde se surte la receta y alguna farmacias pueden ofrecer precios ms baratos.  El sitio web www.goodrx.com tiene cupones para medicamentos de diferentes farmacias. Los precios aqu no tienen en cuenta lo que podra costar con la ayuda del seguro (puede ser ms barato con su seguro), pero el sitio web puede darle el precio si no utiliz ningn seguro.  - Puede imprimir el cupn correspondiente y llevarlo con su receta a la farmacia.  - Tambin puede pasar por nuestra oficina durante el horario de atencin regular y recoger una tarjeta de cupones de GoodRx.  - Si necesita que su receta se enve electrnicamente a una farmacia diferente, informe a nuestra oficina a travs de MyChart de [facility]   o por telfono llamando al [phone] y presione la opcin 4.  

## 2022-12-11 ENCOUNTER — Other Ambulatory Visit: Payer: Self-pay | Admitting: Family

## 2022-12-11 DIAGNOSIS — K219 Gastro-esophageal reflux disease without esophagitis: Secondary | ICD-10-CM

## 2022-12-12 ENCOUNTER — Encounter: Payer: Self-pay | Admitting: Dermatology

## 2023-01-01 ENCOUNTER — Ambulatory Visit: Payer: Medicare Other | Admitting: Dermatology

## 2023-01-15 ENCOUNTER — Other Ambulatory Visit: Payer: Self-pay

## 2023-01-15 DIAGNOSIS — E1169 Type 2 diabetes mellitus with other specified complication: Secondary | ICD-10-CM

## 2023-01-15 MED ORDER — ATORVASTATIN CALCIUM 40 MG PO TABS: 40.0000 mg | ORAL_TABLET | Freq: Every day | ORAL | 3 refills | Status: DC

## 2023-02-12 ENCOUNTER — Other Ambulatory Visit: Payer: Self-pay | Admitting: Family

## 2023-03-08 ENCOUNTER — Telehealth: Payer: Self-pay | Admitting: Family

## 2023-03-08 NOTE — Telephone Encounter (Signed)
Copied from CRM [id number]. Topic: Medicare AWV >> Mar 08, 2023 11:23 AM Payton Doughty wrote: Reason for CRM: LM 03/08/2023 to schedule AWV  Verlee Rossetti; Care Guide Ambulatory Clinical Support [facility] Direct Dial: [phone]

## 2023-03-14 ENCOUNTER — Encounter: Payer: Self-pay | Admitting: Family

## 2023-03-14 NOTE — Telephone Encounter (Signed)
Pt returned [facility] CMA call. Note below was read to her. Pt its booked to see Arnett on 6/18.

## 2023-03-14 NOTE — Telephone Encounter (Signed)
LVM to call back to schedule in office  appt 

## 2023-03-14 NOTE — Telephone Encounter (Signed)
Thank you :)

## 2023-03-26 ENCOUNTER — Encounter: Payer: Self-pay | Admitting: Family

## 2023-03-26 ENCOUNTER — Ambulatory Visit (INDEPENDENT_AMBULATORY_CARE_PROVIDER_SITE_OTHER): Payer: Medicare Other | Admitting: Family

## 2023-03-26 VITALS — BP 130/76 | HR 75 | Temp 98.7°F | Ht 64.0 in | Wt 154.8 lb

## 2023-03-26 DIAGNOSIS — E119 Type 2 diabetes mellitus without complications: Secondary | ICD-10-CM | POA: Diagnosis not present

## 2023-03-26 DIAGNOSIS — R7309 Other abnormal glucose: Secondary | ICD-10-CM

## 2023-03-26 DIAGNOSIS — I10 Essential (primary) hypertension: Secondary | ICD-10-CM

## 2023-03-26 DIAGNOSIS — Z780 Asymptomatic menopausal state: Secondary | ICD-10-CM

## 2023-03-26 DIAGNOSIS — Z8639 Personal history of other endocrine, nutritional and metabolic disease: Secondary | ICD-10-CM

## 2023-03-26 DIAGNOSIS — Z7985 Long-term (current) use of injectable non-insulin antidiabetic drugs: Secondary | ICD-10-CM | POA: Diagnosis not present

## 2023-03-26 DIAGNOSIS — Z1211 Encounter for screening for malignant neoplasm of colon: Secondary | ICD-10-CM

## 2023-03-26 DIAGNOSIS — Z1231 Encounter for screening mammogram for malignant neoplasm of breast: Secondary | ICD-10-CM

## 2023-03-26 DIAGNOSIS — Z7984 Long term (current) use of oral hypoglycemic drugs: Secondary | ICD-10-CM

## 2023-03-26 LAB — POCT GLYCOSYLATED HEMOGLOBIN (HGB A1C): Hemoglobin A1C: 7.4 % — AB (ref 4.0–5.6)

## 2023-03-26 MED ORDER — SEMAGLUTIDE(0.25 OR 0.5MG/DOS) 2 MG/3ML ~~LOC~~ SOPN
0.2500 mg | PEN_INJECTOR | SUBCUTANEOUS | 2 refills | Status: DC
Start: 2023-03-26 — End: 2023-06-20

## 2023-03-26 NOTE — Patient Instructions (Addendum)
Please call  and schedule your 3D mammogram and /or bone density scan as we discussed.   [facility]  ( new location in 2023)  [address], [place], [place] [address]  [place], [place]  [phone]  Start ozempic 0.25mg  once per week once per week injected subcutaneously ( [place])  in stomach. Please clean with alcohol swab prior to injection and be sure to rotate site. You may schedule a nurse visit if you would like to first injection.   After 4 weeks, and if tolerated and weight loss has not reached 1-2 lbs per week, please increase to 0.5mg  once per week [place].    Please read information on medication below and remember black box warning that you may not take if you or a family member is diagnosed with thyroid cancer (medullary thyroid cancer), or multiple endocrine neoplasia ( MEN).       Semaglutide injection solution What is this medicine? SEMAGLUTIDE (Sem a GLOO tide) is used to improve blood sugar control in adults with type 2 diabetes. This medicine may be used with other diabetes medicines. This drug may also reduce the risk of heart attack or stroke if you have type 2 diabetes and risk factors for heart disease. This medicine may be used for other purposes; ask your health care provider or pharmacist if you have questions. COMMON BRAND NAME(S): OZEMPIC What should I tell my health care provider before I take this medicine? They need to know if you have any of these conditions: endocrine tumors (MEN 2) or if someone in your family had these tumors eye disease, vision problems history of pancreatitis kidney disease stomach problems thyroid cancer or if someone in your family had thyroid cancer an unusual or allergic reaction to semaglutide, other medicines, foods, dyes, or preservatives pregnant or trying to get pregnant breast-feeding How should I use this medicine? This medicine is for injection under the skin of your upper leg (thigh), stomach area,  or upper arm. It is given once every week (every 7 days). You will be taught how to prepare and give this medicine. Use exactly as directed. Take your medicine at regular intervals. Do not take it more often than directed. If you use this medicine with insulin, you should inject this medicine and the insulin separately. Do not mix them together. Do not give the injections right next to each other. Change (rotate) injection sites with each injection. It is important that you put your used needles and syringes in a special sharps container. Do not put them in a trash can. If you do not have a sharps container, call your pharmacist or healthcare provider to get one. A special MedGuide will be given to you by the pharmacist with each prescription and refill. Be sure to read this information carefully each time. This drug comes with INSTRUCTIONS FOR USE. Ask your pharmacist for directions on how to use this drug. Read the information carefully. Talk to your pharmacist or health care provider if you have questions. Talk to your pediatrician regarding the use of this medicine in children. Special care may be needed. Overdosage: If you think you have taken too much of this medicine contact a poison control center or emergency room at once. NOTE: This medicine is only for you. Do not share this medicine with others. What if I miss a dose? If you miss a dose, take it as soon as you can within 5 days after the missed dose. Then take your next  dose at your regular weekly time. If it has been longer than 5 days after the missed dose, do not take the missed dose. Take the next dose at your regular time. Do not take double or extra doses. If you have questions about a missed dose, contact your health care provider for advice. What may interact with this medicine? other medicines for diabetes Many medications may cause changes in blood sugar, these include: alcohol containing beverages antiviral medicines for HIV or  AIDS aspirin and aspirin-like drugs certain medicines for blood pressure, heart disease, irregular heart beat chromium diuretics female hormones, such as estrogens or progestins, birth control pills fenofibrate gemfibrozil isoniazid lanreotide female hormones or anabolic steroids MAOIs like Carbex, Eldepryl, Marplan, Nardil, and Parnate medicines for weight loss medicines for allergies, asthma, cold, or cough medicines for depression, anxiety, or psychotic disturbances niacin nicotine NSAIDs, medicines for pain and inflammation, like ibuprofen or naproxen octreotide pasireotide pentamidine phenytoin probenecid quinolone antibiotics such as ciprofloxacin, levofloxacin, ofloxacin some herbal dietary supplements steroid medicines such as prednisone or cortisone sulfamethoxazole; trimethoprim thyroid hormones Some medications can hide the warning symptoms of low blood sugar (hypoglycemia). You may need to monitor your blood sugar more closely if you are taking one of these medications. These include: beta-blockers, often used for high blood pressure or heart problems (examples include atenolol, metoprolol, propranolol) clonidine guanethidine reserpine This list may not describe all possible interactions. Give your health care provider a list of all the medicines, herbs, non-prescription drugs, or dietary supplements you use. Also tell them if you smoke, drink alcohol, or use illegal drugs. Some items may interact with your medicine. What should I watch for while using this medicine? Visit your doctor or health care professional for regular checks on your progress. Drink plenty of fluids while taking this medicine. Check with your doctor or health care professional if you get an attack of severe diarrhea, nausea, and vomiting. The loss of too much body fluid can make it dangerous for you to take this medicine. A test called the HbA1C (A1C) will be monitored. This is a simple blood test.  It measures your blood sugar control over the last 2 to 3 months. You will receive this test every 3 to 6 months. Learn how to check your blood sugar. Learn the symptoms of low and high blood sugar and how to manage them. Always carry a quick-source of sugar with you in case you have symptoms of low blood sugar. Examples include hard sugar candy or glucose tablets. Make sure others know that you can choke if you eat or drink when you develop serious symptoms of low blood sugar, such as seizures or unconsciousness. They must get medical help at once. Tell your doctor or health care professional if you have high blood sugar. You might need to change the dose of your medicine. If you are sick or exercising more than usual, you might need to change the dose of your medicine. Do not skip meals. Ask your doctor or health care professional if you should avoid alcohol. Many nonprescription cough and cold products contain sugar or alcohol. These can affect blood sugar. Pens should never be shared. Even if the needle is changed, sharing may result in passing of viruses like hepatitis or HIV. Wear a medical ID bracelet or chain, and carry a card that describes your disease and details of your medicine and dosage times. Do not become pregnant while taking this medicine. Women should inform their doctor if they wish to  become pregnant or think they might be pregnant. There is a potential for serious side effects to an unborn child. Talk to your health care professional or pharmacist for more information. What side effects may I notice from receiving this medicine? Side effects that you should report to your doctor or health care professional as soon as possible: allergic reactions like skin rash, itching or hives, swelling of the face, lips, or tongue breathing problems changes in vision diarrhea that continues or is severe lump or swelling on the neck severe nausea signs and symptoms of infection like fever or  chills; cough; sore throat; pain or trouble passing urine signs and symptoms of low blood sugar such as feeling anxious, confusion, dizziness, increased hunger, unusually weak or tired, sweating, shakiness, cold, irritable, headache, blurred vision, fast heartbeat, loss of consciousness signs and symptoms of kidney injury like trouble passing urine or change in the amount of urine trouble swallowing unusual stomach upset or pain vomiting Side effects that usually do not require medical attention (report to your doctor or health care professional if they continue or are bothersome): constipation diarrhea nausea pain, redness, or irritation at site where injected stomach upset This list may not describe all possible side effects. Call your doctor for medical advice about side effects. You may report side effects to FDA at 1-800-FDA-1088. Where should I keep my medicine? Keep out of the reach of children. Store unopened pens in a refrigerator between 2 and 8 degrees C (36 and 46 degrees F). Do not freeze. Protect from light and heat. After you first use the pen, it can be stored for 56 days at room temperature between 15 and 30 degrees C (59 and 86 degrees F) or in a refrigerator. Throw away your used pen after 56 days or after the expiration date, whichever comes first. Do not store your pen with the needle attached. If the needle is left on, medicine may leak from the pen. NOTE: This sheet is a summary. It may not cover all possible information. If you have questions about this medicine, talk to your doctor, pharmacist, or health care provider.  2021 Elsevier/Gold Standard (2019-06-09 09:41:51)

## 2023-03-26 NOTE — Assessment & Plan Note (Signed)
Lab Results  Component Value Date   HGBA1C 7.4 (A) 03/26/2023   A1c as elevated with weight gain.  Resume Ozempic 0.25 mg and titrate.  Continue metformin 500 mg qd

## 2023-03-26 NOTE — Progress Notes (Signed)
Assessment & Plan:  Type 2 diabetes mellitus without complication, without long-term current use of insulin (HCC) Assessment & Plan: Lab Results  Component Value Date   HGBA1C 7.4 (A) 03/26/2023   A1c as elevated with weight gain.  Resume Ozempic 0.25 mg and titrate.  Continue metformin 500 mg qd  Orders: -     CBC with Differential/Platelet; Future -     Comprehensive metabolic panel; Future -     Lipid panel; Future -     Microalbumin / creatinine urine ratio; Future -     Semaglutide(0.25 or 0.5MG /DOS); Inject 0.25 mg into the skin once a week. Increase to 0.5mg  [place] qwk in 4 weeks  Dispense: 3 mL; Refill: 2  Elevated glucose -     POCT glycosylated hemoglobin (Hb A1C)  Encounter for screening mammogram for malignant neoplasm of breast -     3D Screening Mammogram, Left and Right; Future  Postmenopausal estrogen deficiency -     DG Bone Density; Future  Screening for colon cancer -     Ambulatory referral to Gastroenterology  History of vitamin D deficiency -     VITAMIN D 25 Hydroxy (Vit-D Deficiency, Fractures); Future  Essential hypertension Assessment & Plan: Lower extremity edema with amlodipine, improved with cessation of medication.  Continue ramipril 20 mg every day.  Consider hydrochlorothiazide if needed for blood pressure control, leg swelling.  Counseled on low-sodium diet, compression stockings  Orders: -     Comprehensive metabolic panel; Future -     TSH; Future     Return precautions given.   Risks, benefits, and alternatives of the medications and treatment plan prescribed today were discussed, and patient expressed understanding.   Education regarding symptom management and diagnosis given to patient on AVS either electronically or printed.  Return in about 3 months (around 06/26/2023) for Medicare Wellness upcoming or due, schedule, Fasting labs in 2-3 weeks.  Rennie Plowman, FNP  Subjective:    Patient ID: Tiffany Gregory, female    DOB:  03/27/1951, 72 y.o.   MRN: [id number]  CC: Tiffany Gregory is a 72 y.o. female who presents today for follow up.   HPI: She has gained weight since coming off of the Ozempic.  Approximately 10 pounds.  She is compliant with metformin 500 mg once daily.   FBG 205, 192  She walks dogs daily.       She discontinued amlodipine to leg swelling, since improved. Compliant with ramipril 10 mg every day   Allergies: Lemon flavor, Amlodipine, Keflex [cephalexin], [name], Sulfa antibiotics, and Valsartan Current Outpatient Medications on File Prior to Visit  Medication Sig Dispense Refill   acetaminophen (TYLENOL) 325 MG tablet Take 650 mg by mouth every 6 (six) hours as needed.     aspirin EC 81 MG tablet Take 81 mg by mouth daily. Swallow whole.     atorvastatin (LIPITOR) 40 MG tablet Take 1 tablet (40 mg total) by mouth daily. 90 tablet 3   Blood Glucose Monitoring Suppl (ONE TOUCH ULTRA 2) w/Device KIT 1 Device by Does not apply route daily. 1 kit 0   fluticasone (FLONASE) 50 MCG/ACT nasal spray SHAKE LIQUID AND USE 2 SPRAYS IN EACH NOSTRIL DAILY 48 g 3   glucose blood test strip 1 each by Other route as needed for other. Use as instructed. One touch ultra blue in vitro strips 100 each 3   metFORMIN (GLUCOPHAGE-XR) 500 MG 24 hr tablet TAKE 1 TABLET(500 MG) BY MOUTH DAILY WITH BREAKFAST  90 tablet 1   omeprazole (PRILOSEC) 20 MG capsule TAKE 2 CAPSULES(40 MG) BY MOUTH DAILY 180 capsule 1   OneTouch Delica Lancets 33G MISC by Does not apply route.     polyethylene glycol (MIRALAX / GLYCOLAX) 17 g packet Take 17 g by mouth daily.     ramipril (ALTACE) 10 MG capsule Take 2 capsules (20 mg total) by mouth daily. 180 capsule 3   No current facility-administered medications on file prior to visit.    Review of Systems  Constitutional:  Negative for chills and fever.  Respiratory:  Negative for cough.   Cardiovascular:  Negative for chest pain, palpitations and leg swelling  (improved).  Gastrointestinal:  Negative for nausea and vomiting.      Objective:    BP 130/76   Pulse 75   Temp 98.7 F (37.1 C) (Oral)   Ht 5\' 4"  (1.626 m)   Wt 154 lb 12.8 oz (70.2 kg)   SpO2 95%   BMI 26.57 kg/m  BP Readings from Last 3 Encounters:  03/26/23 130/76  12/06/22 126/69  11/12/22 118/78   Wt Readings from Last 3 Encounters:  03/26/23 154 lb 12.8 oz (70.2 kg)  11/12/22 147 lb 9.6 oz (67 kg)  08/09/22 138 lb 12.8 oz (63 kg)    Physical Exam Vitals reviewed.  Constitutional:      Appearance: She is well-developed.  Eyes:     Conjunctiva/sclera: Conjunctivae normal.  Cardiovascular:     Rate and Rhythm: Normal rate and regular rhythm.     Pulses: Normal pulses.     Heart sounds: Normal heart sounds.     Comments: Trace edema around left ankle.  Nonpitting  No  palpable cords or masses. No erythema or increased warmth. No asymmetry in calf size when compared bilaterally LE hair growth symmetric and present. No discoloration or varicosities noted. LE warm and palpable pedal pulses.   Pulmonary:     Effort: Pulmonary effort is normal.     Breath sounds: Normal breath sounds. No wheezing, rhonchi or rales.  Skin:    General: Skin is warm and dry.  Neurological:     Mental Status: She is alert.  Psychiatric:        Speech: Speech normal.        Behavior: Behavior normal.        Thought Content: Thought content normal.

## 2023-03-26 NOTE — Assessment & Plan Note (Addendum)
Lower extremity edema with amlodipine, improved with cessation of medication.  Continue ramipril 20 mg every day.  Consider hydrochlorothiazide if needed for blood pressure control, leg swelling.  Counseled on low-sodium diet, compression stockings

## 2023-04-09 ENCOUNTER — Encounter: Payer: Self-pay | Admitting: Family

## 2023-04-09 ENCOUNTER — Other Ambulatory Visit (INDEPENDENT_AMBULATORY_CARE_PROVIDER_SITE_OTHER): Payer: Medicare Other

## 2023-04-09 ENCOUNTER — Other Ambulatory Visit: Payer: Self-pay | Admitting: Family

## 2023-04-09 DIAGNOSIS — D649 Anemia, unspecified: Secondary | ICD-10-CM

## 2023-04-09 DIAGNOSIS — Z8639 Personal history of other endocrine, nutritional and metabolic disease: Secondary | ICD-10-CM | POA: Diagnosis not present

## 2023-04-09 DIAGNOSIS — E119 Type 2 diabetes mellitus without complications: Secondary | ICD-10-CM | POA: Diagnosis not present

## 2023-04-09 DIAGNOSIS — I10 Essential (primary) hypertension: Secondary | ICD-10-CM

## 2023-04-09 LAB — COMPREHENSIVE METABOLIC PANEL
ALT: 15 U/L (ref 0–35)
AST: 17 U/L (ref 0–37)
Albumin: 4.1 g/dL (ref 3.5–5.2)
Alkaline Phosphatase: 76 U/L (ref 39–117)
BUN: 11 mg/dL (ref 6–23)
CO2: 27 mEq/L (ref 19–32)
Calcium: 9.3 mg/dL (ref 8.4–10.5)
Chloride: 102 mEq/L (ref 96–112)
Creatinine, Ser: 0.75 mg/dL (ref 0.40–1.20)
GFR: 80.19 mL/min (ref >60.00)
Glucose, Bld: 136 mg/dL — ABNORMAL HIGH (ref 70–99)
Potassium: 3.9 mEq/L (ref 3.5–5.1)
Sodium: 138 mEq/L (ref 135–145)
Total Bilirubin: 0.4 mg/dL (ref 0.2–1.2)
Total Protein: 7.4 g/dL (ref 6.0–8.3)

## 2023-04-09 LAB — CBC WITH DIFFERENTIAL/PLATELET
Basophils Absolute: 0.1 10*3/uL (ref 0.0–0.1)
Basophils Relative: 0.9 % (ref 0.0–3.0)
Eosinophils Absolute: 0.2 10*3/uL (ref 0.0–0.7)
Eosinophils Relative: 2.9 % (ref 0.0–5.0)
HCT: 35.4 % — ABNORMAL LOW (ref 36.0–46.0)
Hemoglobin: 11.5 g/dL — ABNORMAL LOW (ref 12.0–15.0)
Lymphocytes Relative: 21.1 % (ref 12.0–46.0)
Lymphs Abs: 1.6 10*3/uL (ref 0.7–4.0)
MCHC: 32.4 g/dL (ref 30.0–36.0)
MCV: 80.2 fl (ref 78.0–100.0)
Monocytes Absolute: 0.5 10*3/uL (ref 0.1–1.0)
Monocytes Relative: 6.9 % (ref 3.0–12.0)
Neutro Abs: 5.1 10*3/uL (ref 1.4–7.7)
Neutrophils Relative %: 68.2 % (ref 43.0–77.0)
Platelets: 333 10*3/uL (ref 150.0–400.0)
RBC: 4.41 Mil/uL (ref 3.87–5.11)
RDW: 15.4 % (ref 11.5–15.5)
WBC: 7.5 10*3/uL (ref 4.0–10.5)

## 2023-04-09 LAB — LIPID PANEL
Cholesterol: 115 mg/dL (ref 0–200)
HDL: 37.2 mg/dL — ABNORMAL LOW (ref >39.00)
LDL Cholesterol: 60 mg/dL (ref 0–99)
NonHDL: 77.72
Total CHOL/HDL Ratio: 3
Triglycerides: 90 mg/dL (ref 0.0–149.0)
VLDL: 18 mg/dL (ref 0.0–40.0)

## 2023-04-09 LAB — MICROALBUMIN / CREATININE URINE RATIO
Creatinine,U: 166.1 mg/dL
Microalb Creat Ratio: 1 mg/g (ref 0.0–30.0)
Microalb, Ur: 1.7 mg/dL (ref 0.0–1.9)

## 2023-04-09 LAB — TSH: TSH: 1.52 u[IU]/mL (ref 0.35–5.50)

## 2023-04-09 LAB — VITAMIN D 25 HYDROXY (VIT D DEFICIENCY, FRACTURES): VITD: 19.76 ng/mL — ABNORMAL LOW (ref 30.00–100.00)

## 2023-04-09 MED ORDER — CHOLECALCIFEROL 1.25 MG (50000 UT) PO TABS
ORAL_TABLET | ORAL | 0 refills | Status: DC
Start: 2023-04-09 — End: 2023-06-26

## 2023-04-22 ENCOUNTER — Other Ambulatory Visit (INDEPENDENT_AMBULATORY_CARE_PROVIDER_SITE_OTHER): Payer: Medicare Other

## 2023-04-22 DIAGNOSIS — D649 Anemia, unspecified: Secondary | ICD-10-CM | POA: Diagnosis not present

## 2023-04-22 LAB — URINALYSIS, ROUTINE W REFLEX MICROSCOPIC
Bilirubin Urine: NEGATIVE
Hgb urine dipstick: NEGATIVE
Ketones, ur: NEGATIVE
Nitrite: NEGATIVE
Specific Gravity, Urine: 1.01 (ref 1.000–1.030)
Total Protein, Urine: NEGATIVE
Urine Glucose: NEGATIVE
Urobilinogen, UA: 0.2 (ref 0.0–1.0)
pH: 6.5 (ref 5.0–8.0)

## 2023-04-22 LAB — B12 AND FOLATE PANEL
Folate: 20.3 ng/mL (ref >5.9)
Vitamin B-12: 406 pg/mL (ref 211–911)

## 2023-04-22 LAB — IBC + FERRITIN
Ferritin: 9.4 ng/mL — ABNORMAL LOW (ref 10.0–291.0)
Iron: 52 ug/dL (ref 42–145)
Saturation Ratios: 13.5 % — ABNORMAL LOW (ref 20.0–50.0)
TIBC: 386.4 ug/dL (ref 250.0–450.0)
Transferrin: 276 mg/dL (ref 212.0–360.0)

## 2023-04-23 ENCOUNTER — Encounter: Payer: Self-pay | Admitting: Family

## 2023-04-23 ENCOUNTER — Other Ambulatory Visit: Payer: Self-pay | Admitting: Family

## 2023-04-23 DIAGNOSIS — E119 Type 2 diabetes mellitus without complications: Secondary | ICD-10-CM

## 2023-05-21 ENCOUNTER — Ambulatory Visit: Payer: Self-pay

## 2023-05-21 NOTE — Patient Outreach (Signed)
  Care Coordination   Initial Visit Note   05/21/2023 Name: Nelva Hoar MRN: [id number] DOB: [birth year]  Prentice Tarr is a 72 y.o. year old female who sees Arnett, Lyn Records, FNP for primary care. I spoke with  Corinna Lines by phone today.  What matters to the patients health and wellness today?  CM educated patient on [facility] services.  Patient declines services and feels that she is able to manage her medical needs.  Patient agreed to contact her provider in the future if she needs [facility] services.    Goals Addressed   None     SDOH assessments and interventions completed:  No     Care Coordination Interventions:  Yes, provided   Interventions Today    Flowsheet Row Most Recent Value  Chronic Disease   Chronic disease during today's visit Diabetes, Other  [A1C changed and resumed Ozempic.  Anemic and on iron]  General Interventions   General Interventions Discussed/Reviewed General Interventions Discussed  Education Interventions   Education Provided Provided Education  [Educated on [facility]]  Mental Health Interventions   Mental Health Discussed/Reviewed Grief and Loss  [Coping with the loss of her husband]  Pharmacy Interventions   Pharmacy Dicussed/Reviewed Pharmacy Topics Discussed, Affording Medications  [Has worked with [facility] to get affordable plan for Ozempic]        Follow up plan: No further intervention required.   Encounter Outcome:  Pt. Visit Completed

## 2023-05-22 ENCOUNTER — Ambulatory Visit
Admission: RE | Admit: 2023-05-22 | Discharge: 2023-05-22 | Disposition: A | Discharge status: Home / Self Care | Payer: Medicare Other | Source: Ambulatory Visit | Attending: Family | Admitting: Family

## 2023-05-22 DIAGNOSIS — Z780 Asymptomatic menopausal state: Secondary | ICD-10-CM | POA: Insufficient documentation

## 2023-05-22 DIAGNOSIS — Z1231 Encounter for screening mammogram for malignant neoplasm of breast: Secondary | ICD-10-CM

## 2023-05-23 ENCOUNTER — Encounter (INDEPENDENT_AMBULATORY_CARE_PROVIDER_SITE_OTHER): Payer: Self-pay

## 2023-06-14 ENCOUNTER — Encounter: Payer: Self-pay | Admitting: Family

## 2023-06-14 NOTE — Telephone Encounter (Signed)
Noted  

## 2023-06-14 NOTE — Telephone Encounter (Signed)
Scheduled appt does she need labs done if so which ones

## 2023-06-17 ENCOUNTER — Encounter: Payer: Self-pay | Admitting: Family

## 2023-06-17 ENCOUNTER — Ambulatory Visit (INDEPENDENT_AMBULATORY_CARE_PROVIDER_SITE_OTHER): Payer: Medicare Other | Admitting: *Deleted

## 2023-06-17 ENCOUNTER — Ambulatory Visit (INDEPENDENT_AMBULATORY_CARE_PROVIDER_SITE_OTHER): Payer: Medicare Other | Admitting: Family

## 2023-06-17 VITALS — BP 128/8 | HR 75 | Temp 97.7°F | Ht 64.0 in | Wt 150.2 lb

## 2023-06-17 VITALS — Ht 64.0 in | Wt 148.0 lb

## 2023-06-17 DIAGNOSIS — Z0000 Encounter for general adult medical examination without abnormal findings: Secondary | ICD-10-CM | POA: Diagnosis not present

## 2023-06-17 DIAGNOSIS — D649 Anemia, unspecified: Secondary | ICD-10-CM

## 2023-06-17 DIAGNOSIS — H00015 Hordeolum externum left lower eyelid: Secondary | ICD-10-CM | POA: Diagnosis not present

## 2023-06-17 LAB — CBC WITH DIFFERENTIAL/PLATELET
Basophils Absolute: 0 10*3/uL (ref 0.0–0.1)
Basophils Relative: 0.3 % (ref 0.0–3.0)
Eosinophils Absolute: 0.3 10*3/uL (ref 0.0–0.7)
Eosinophils Relative: 2.3 % (ref 0.0–5.0)
HCT: 37.7 % (ref 36.0–46.0)
Hemoglobin: 12.1 g/dL (ref 12.0–15.0)
Lymphocytes Relative: 17.9 % (ref 12.0–46.0)
Lymphs Abs: 2 10*3/uL (ref 0.7–4.0)
MCHC: 32.2 g/dL (ref 30.0–36.0)
MCV: 80.6 fl (ref 78.0–100.0)
Monocytes Absolute: 0.5 10*3/uL (ref 0.1–1.0)
Monocytes Relative: 4.9 % (ref 3.0–12.0)
Neutro Abs: 8.4 10*3/uL — ABNORMAL HIGH (ref 1.4–7.7)
Neutrophils Relative %: 74.6 % (ref 43.0–77.0)
Platelets: 341 10*3/uL (ref 150.0–400.0)
RBC: 4.67 Mil/uL (ref 3.87–5.11)
RDW: 16.3 % — ABNORMAL HIGH (ref 11.5–15.5)
WBC: 11.2 10*3/uL — ABNORMAL HIGH (ref 4.0–10.5)

## 2023-06-17 NOTE — Patient Instructions (Signed)
Tiffany Gregory , Thank you for taking time to come for your Medicare Wellness Visit. I appreciate your ongoing commitment to your health goals. Please review the following plan we discussed and let me know if I can assist you in the future.   Referrals/Orders/Follow-Ups/Clinician Recommendations: None  This is a list of the screening recommended for you and due dates:  Health Maintenance  Topic Date Due   DTaP/Tdap/Td vaccine (1 - Tdap) Never done   Zoster (Shingles) Vaccine (1 of 2) Never done   Colon Cancer Screening  Never done   Flu Shot  05/09/2023   COVID-19 Vaccine (4 - 2023-24 season) 06/09/2023   Hemoglobin A1C  09/25/2023   Eye exam for diabetics  10/04/2023   Complete foot exam   03/25/2024   Yearly kidney function blood test for diabetes  04/08/2024   Yearly kidney health urinalysis for diabetes  04/08/2024   Medicare Annual Wellness Visit  06/16/2024   Mammogram  05/21/2025   Pneumonia Vaccine  Completed   DEXA scan (bone density measurement)  Completed   Hepatitis C Screening  Completed   HPV Vaccine  Aged Out    Advanced directives: (Declined) Advance directive discussed with you today. Even though you declined this today, please call our office should you change your mind, and we can give you the proper paperwork for you to fill out.  Next Medicare Annual Wellness Visit scheduled for next year: Yes 06/19/24 @ 9:45

## 2023-06-17 NOTE — Progress Notes (Signed)
Subjective:   Tiffany Gregory is a 72 y.o. female who presents for Medicare Annual (Subsequent) preventive examination.  Visit Complete: Virtual  I connected with  [name] on 06/17/23 by a audio enabled telemedicine application and verified that I am speaking with the correct person using two identifiers.  Patient Location: Home  Provider Location: Office/Clinic  I discussed the limitations of evaluation and management by telemedicine. The patient expressed understanding and agreed to proceed.  Vital Signs: Unable to obtain new vitals due to this being a telehealth visit.   Review of Systems     Cardiac Risk Factors include: advanced age (>27men, >35 women);diabetes mellitus;dyslipidemia;hypertension     Objective:    Today's Vitals   06/17/23 1040 06/17/23 1041  Weight: 148 lb (67.1 kg)   Height: 5\' 4"  (1.626 m)   PainSc:  5    Body mass index is 25.4 kg/m.     06/17/2023   11:23 AM 03/23/2022    5:42 PM 03/23/2022    2:15 PM 06/08/2021    4:58 PM 01/15/2021    8:19 PM 11/29/2020   11:11 AM 10/19/2020   12:13 PM  Advanced Directives  Does Patient Have a Medical Advance Directive? No No No Yes No Yes Yes  Type of Theme park manager;Living will  Healthcare Power of [place];Living will Living will  Does patient want to make changes to medical advance directive?    No - Patient declined     Copy of Healthcare Power of Attorney in Chart?    No - copy requested  No - copy requested   Would patient like information on creating a medical advance directive? No - Patient declined No - Patient declined No - Patient declined  No - Patient declined      Current Medications (verified) Outpatient Encounter Medications as of 06/17/2023  Medication Sig   acetaminophen (TYLENOL) 325 MG tablet Take 650 mg by mouth every 6 (six) hours as needed.   aspirin EC 81 MG tablet Take 81 mg by mouth daily. Swallow whole.   atorvastatin (LIPITOR) 40 MG tablet  Take 1 tablet (40 mg total) by mouth daily.   Blood Glucose Monitoring Suppl (ONE TOUCH ULTRA 2) w/Device KIT 1 Device by Does not apply route daily.   ferrous sulfate 324 MG TBEC Take 324 mg by mouth daily.   fluticasone (FLONASE) 50 MCG/ACT nasal spray SHAKE LIQUID AND USE 2 SPRAYS IN EACH NOSTRIL DAILY   glucose blood test strip 1 each by Other route as needed for other. Use as instructed. One touch ultra blue in vitro strips   metFORMIN (GLUCOPHAGE-XR) 500 MG 24 hr tablet TAKE 1 TABLET(500 MG) BY MOUTH DAILY WITH BREAKFAST   omeprazole (PRILOSEC) 20 MG capsule TAKE 2 CAPSULES(40 MG) BY MOUTH DAILY   OneTouch Delica Lancets 33G MISC by Does not apply route.   polyethylene glycol (MIRALAX / GLYCOLAX) 17 g packet Take 17 g by mouth daily.   Semaglutide,0.25 or 0.5MG /DOS, 2 MG/3ML SOPN Inject 0.25 mg into the skin once a week. Increase to 0.5mg  [place] qwk in 4 weeks (Patient taking differently: Inject 0.25 mg into the skin once a week. Increase to 0.5mg   qwk in 4 weeks Takes 1 mg every week now)   Cholecalciferol 1.25 MG (50000 UT) TABS 50,000 units PO qwk for 8 weeks. (Patient not taking: Reported on 06/17/2023)   ramipril (ALTACE) 10 MG capsule Take 2 capsules (20 mg total) by mouth daily.  No facility-administered encounter medications on file as of 06/17/2023.    Allergies (verified) Lemon flavor, Amlodipine, Keflex [cephalexin], [name], Sulfa antibiotics, and Valsartan   History: Past Medical History:  Diagnosis Date   Allergy    Asthma    Diabetes mellitus without complication (HCC)    Elevated liver enzymes    Hepatic steatosis    History of hepatitis B 1971   She had IV drug use experimentation and was hosptialized and reports treated.    Hyperlipidemia    Hypertension    Past Surgical History:  Procedure Laterality Date   ABDOMINAL HYSTERECTOMY     BREAST BIOPSY Left    Biopsy done in [place]   CRANIECTOMY  10/09/2019   slipped on ice, hematoma   NASAL  FRACTURE SURGERY  1975   NASAL SINUS SURGERY  2010   Family History  Problem Relation Age of Onset   Diabetes Mother    Heart attack Mother    Diabetes Father    Heart disease Father    Hypertension Father    Diabetes Sister    COPD Sister    Heart disease Sister    Diabetes Brother    Heart attack Maternal Grandmother    Cancer Maternal Grandfather    Heart attack Paternal Grandmother    Cancer Paternal Grandfather    Heart attack Brother    Heart disease Brother    Social History   Socioeconomic History   Marital status: Widowed    Spouse name: Tyreka Szeliga   Number of children: 2   Years of education: 16   Highest education level: Bachelor's degree (e.g., BA, AB, BS)  Occupational History   Occupation: self employed  Tobacco Use   Smoking status: Never    Passive exposure: Past   Smokeless tobacco: Never  Vaping Use   Vaping status: Never Used  Substance and Sexual Activity   Alcohol use: Yes    Comment: rare   Drug use: Never   Sexual activity: Not Currently    Partners: Male  Other Topics Concern   Not on file  Social History Narrative   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    Social Determinants of Health   Financial Resource Strain: Low Risk  (06/17/2023)   Overall Financial Resource Strain (CARDIA)    Difficulty of Paying Living Expenses: Not hard at all  Food Insecurity: No Food Insecurity (06/17/2023)   Hunger Vital Sign    Worried About Running Out of Food in the Last Year: Never true    Ran Out of Food in the Last Year: Never true  Transportation Needs: No Transportation Needs (06/17/2023)   PRAPARE - Administrator, Civil Service (Medical): No    Lack of Transportation (Non-Medical): No  Physical Activity: Inactive (06/17/2023)   Exercise Vital Sign    Days of Exercise per Week: 0 days    Minutes of Exercise per Session: 0 min  Stress: No Stress  Concern Present (06/17/2023)   [employer] of Occupational Health - Occupational Stress Questionnaire    Feeling of Stress : Only a little  Social Connections: Socially Isolated (06/17/2023)   Social Connection and Isolation Panel [NHANES]    Frequency of Communication with Friends and Family: Once a week    Frequency of Social Gatherings with Friends and Family: Once a week    Attends Religious Services: Never  Active Member of Clubs or Organizations: Yes    Attends Banker Meetings: More than 4 times per year    Marital Status: Widowed    Tobacco Counseling Counseling given: Not Answered   Clinical Intake:  Pre-visit preparation completed: Yes  Pain : 0-10 Pain Score: 5  Pain Type: Chronic pain Pain Location:  (thinks that it is her mattress) Pain Descriptors / Indicators: Sore, Aching Pain Onset: More than a month ago Pain Frequency: Intermittent     BMI - recorded: 25.4 Nutritional Status: BMI 25 -29 Overweight Nutritional Risks: None Diabetes: Yes CBG done?: No Did pt. bring in CBG monitor from home?: No  How often do you need to have someone help you when you read instructions, pamphlets, or other written materials from your doctor or pharmacy?: 1 - Never  Interpreter Needed?: No  Information entered by :: R. Shaniyah Wix LPN   Activities of Daily Living    06/17/2023   10:44 AM  In your present state of health, do you have any difficulty performing the following activities:  Hearing? 0  Vision? 1  Comment glasses, problem with left eye  Difficulty concentrating or making decisions? 0  Walking or climbing stairs? 0  Dressing or bathing? 0  Doing errands, shopping? 0  Preparing Food and eating ? N  Using the Toilet? N  In the past six months, have you accidently leaked urine? N  Do you have problems with loss of bowel control? N  Managing your Medications? N  Managing your Finances? N  Housekeeping or managing your Housekeeping? N     Patient Care Team: Allegra Grana, FNP as PCP - General (Family Medicine) [place] Crane, MD as Consulting Physician (Ophthalmology) Lonell Face, MD as Consulting Physician (Neurology) Venetia Night, MD as Consulting Physician (Neurosurgery) Deirdre Evener, MD (Dermatology) Pasty Spillers, MD (Inactive) as Consulting Physician (Gastroenterology)  Indicate any recent Medical Services you may have received from other than [facility] providers in the past year (date may be approximate).     Assessment:   This is a routine wellness examination for Hannah.  Hearing/Vision screen Hearing Screening - Comments:: No issues Vision Screening - Comments:: Glasses, problem with left eye   Goals Addressed             This Visit's Progress    Patient Stated       maintain       Depression Screen    06/17/2023   10:52 AM 03/26/2023   11:05 AM 11/12/2022    9:42 AM 08/09/2022    9:51 AM 04/27/2022    2:28 PM 03/23/2022    1:54 PM 09/25/2021    9:03 AM  PHQ 2/9 Scores  PHQ - 2 Score 0 0 0 0 2  4  PHQ- 9 Score 4    7    Exception Documentation      Other- indicate reason in comment box   Not completed      Monthly visits with clinical therapist/social worker     Fall Risk    06/17/2023   10:46 AM 03/26/2023   11:05 AM 11/12/2022    9:41 AM 08/09/2022    9:42 AM 04/27/2022    2:28 PM  Fall Risk   Falls in the past year? 0 0 0 0 0  Number falls in past yr: 0 0 0 0 0  Injury with Fall? 0 0 0 0 0  Risk for fall due to : No Fall  Risks No Fall Risks No Fall Risks No Fall Risks No Fall Risks  Follow up Falls prevention discussed;Falls evaluation completed Falls evaluation completed Falls evaluation completed Falls evaluation completed Falls evaluation completed    MEDICARE RISK AT HOME: Medicare Risk at Home Any stairs in or around the home?: Yes If so, are there any without handrails?: No Home free of loose throw rugs in walkways, pet beds, electrical cords, etc?:  Yes Adequate lighting in your home to reduce risk of falls?: Yes Life alert?: No Use of a cane, walker or w/c?: No Grab bars in the bathroom?: Yes Shower chair or bench in shower?: Yes Elevated toilet seat or a handicapped toilet?: No   Cognitive Function:        06/17/2023   11:11 AM  6CIT Screen  What Year? 0 points  What month? 0 points  What time? 0 points  Count back from 20 0 points  Months in reverse 0 points  Repeat phrase 2 points  Total Score 2 points    Immunizations Immunization History  Administered Date(s) Administered   Fluad Quad(high Dose 65+) 07/22/2020   Hep A / Hep B 01/10/2021, 02/10/2021   Influenza,inj,Quad PF,6+ Mos 08/09/2022   Influenza-Unspecified 07/17/2018   Moderna Sars-Covid-2 Vaccination 01/13/2020, 02/10/2020, 08/27/2020   PNEUMOCOCCAL CONJUGATE-20 08/09/2022   Pneumococcal Conjugate-13 05/15/2017   Pneumococcal-Unspecified 10/08/2009    TDAP status: Due, Education has been provided regarding the importance of this vaccine. Advised may receive this vaccine at local pharmacy or Health Dept. Aware to provide a copy of the vaccination record if obtained from local pharmacy or Health Dept. Verbalized acceptance and understanding.  Flu Vaccine status: Up to date  Pneumococcal vaccine status: Up to date  Covid-19 vaccine status: Completed vaccines  Qualifies for Shingles Vaccine? Yes   Zostavax completed No   Shingrix Completed?: No.    Education has been provided regarding the importance of this vaccine. Patient has been advised to call insurance company to determine out of pocket expense if they have not yet received this vaccine. Advised may also receive vaccine at local pharmacy or Health Dept. Verbalized acceptance and understanding.  Screening Tests Health Maintenance  Topic Date Due   DTaP/Tdap/Td (1 - Tdap) Never done   Zoster Vaccines- Shingrix (1 of 2) Never done   Colonoscopy  Never done   INFLUENZA VACCINE  05/09/2023    COVID-19 Vaccine (4 - 2023-24 season) 06/09/2023   HEMOGLOBIN A1C  09/25/2023   OPHTHALMOLOGY EXAM  10/04/2023   FOOT EXAM  03/25/2024   Diabetic kidney evaluation - eGFR measurement  04/08/2024   Diabetic kidney evaluation - Urine ACR  04/08/2024   Medicare Annual Wellness (AWV)  06/16/2024   MAMMOGRAM  05/21/2025   Pneumonia Vaccine 54+ Years old  Completed   DEXA SCAN  Completed   Hepatitis C Screening  Completed   HPV VACCINES  Aged Out    Health Maintenance  Health Maintenance Due  Topic Date Due   DTaP/Tdap/Td (1 - Tdap) Never done   Zoster Vaccines- Shingrix (1 of 2) Never done   Colonoscopy  Never done   INFLUENZA VACCINE  05/09/2023   COVID-19 Vaccine (4 - 2023-24 season) 06/09/2023   Colorectal cancer screening  Patient declines  Mammogram status: Completed 8/24. Repeat every year  Bone Density status: Completed 8/24. Results reflect: Bone density results: NORMAL. Repeat every 2 years.  Lung Cancer Screening: (Low Dose CT Chest recommended if Age 76-80 years, 20 pack-year currently smoking OR have quit  w/in 15years.) does not qualify.    Additional Screening:  Hepatitis C Screening: does qualify; Completed 9/21  Vision Screening: Recommended annual ophthalmology exams for early detection of glaucoma and other disorders of the eye. Is the patient up to date with their annual eye exam?  Yes  Who is the provider or what is the name of the office in which the patient attends annual eye exams? [facility] If pt is not established with a provider, would they like to be referred to a provider to establish care? No .   Dental Screening: Recommended annual dental exams for proper oral hygiene  Diabetic Foot Exam: Diabetic Foot Exam: Completed 6/24  Community Resource Referral / Chronic Care Management: CRR required this visit?  No   CCM required this visit?  No     Plan:     I have personally reviewed and noted the following in the patient's chart:    Medical and social history Use of alcohol, tobacco or illicit drugs  Current medications and supplements including opioid prescriptions. Patient is not currently taking opioid prescriptions. Functional ability and status Nutritional status Physical activity Advanced directives List of other physicians Hospitalizations, surgeries, and ER visits in previous 12 months Vitals Screenings to include cognitive, depression, and falls Referrals and appointments  In addition, I have reviewed and discussed with patient certain preventive protocols, quality metrics, and best practice recommendations. A written personalized care plan for preventive services as well as general preventive health recommendations were provided to patient.     Sydell Axon, LPN   05/09/9561   After Visit Summary: (MyChart) Due to this being a telephonic visit, the after visit summary with patients personalized plan was offered to patient via MyChart   Nurse Notes: None

## 2023-06-17 NOTE — Progress Notes (Signed)
Assessment & Plan:  Anemia, unspecified type -     CBC with Differential/Platelet -     Iron, TIBC and Ferritin Panel  Hordeolum externum of left lower eyelid Assessment & Plan: Duration 5 days.  Improving with warm compresses.  No symptoms to suggest cellulitis.  Advised continued warm compresses.      Return precautions given.   Risks, benefits, and alternatives of the medications and treatment plan prescribed today were discussed, and patient expressed understanding.   Education regarding symptom management and diagnosis given to patient on AVS either electronically or printed.  No follow-ups on file.  Rennie Plowman, FNP  Subjective:    Patient ID: Tiffany Gregory, female    DOB: 1951-09-12, 72 y.o.   MRN: [id number]  CC: Tiffany Gregory is a 72 y.o. female who presents today for an acute visit.    HPI: Tender Left lower lid lesion, tender x 5 days, improved with warm compresses. No purulent discharge.   She gets a 'stye' once per year, on left eye  No vision changes, purulent discharge  Very minimal make up ; no new make up .  She has allergies; h/o Bells Palsy ( left side)      Follows with [facility] , Dr Brooke Dare  Allergies: Luster Landsberg flavor, Amlodipine, Keflex [cephalexin], [name], Neosporin [bacitracin-polymyxin b], Sulfa antibiotics, and Valsartan Current Outpatient Medications on File Prior to Visit  Medication Sig Dispense Refill   acetaminophen (TYLENOL) 325 MG tablet Take 650 mg by mouth every 6 (six) hours as needed.     aspirin EC 81 MG tablet Take 81 mg by mouth daily. Swallow whole.     atorvastatin (LIPITOR) 40 MG tablet Take 1 tablet (40 mg total) by mouth daily. 90 tablet 3   Blood Glucose Monitoring Suppl (ONE TOUCH ULTRA 2) w/Device KIT 1 Device by Does not apply route daily. 1 kit 0   ferrous sulfate 324 MG TBEC Take 324 mg by mouth daily.     fluticasone (FLONASE) 50 MCG/ACT nasal spray SHAKE LIQUID AND USE 2 SPRAYS IN EACH NOSTRIL  DAILY 48 g 3   glucose blood test strip 1 each by Other route as needed for other. Use as instructed. One touch ultra blue in vitro strips 100 each 3   metFORMIN (GLUCOPHAGE-XR) 500 MG 24 hr tablet TAKE 1 TABLET(500 MG) BY MOUTH DAILY WITH BREAKFAST 90 tablet 1   omeprazole (PRILOSEC) 20 MG capsule TAKE 2 CAPSULES(40 MG) BY MOUTH DAILY 180 capsule 1   OneTouch Delica Lancets 33G MISC by Does not apply route.     polyethylene glycol (MIRALAX / GLYCOLAX) 17 g packet Take 17 g by mouth daily.     Semaglutide,0.25 or 0.5MG /DOS, 2 MG/3ML SOPN Inject 0.25 mg into the skin once a week. Increase to 0.5mg  [name] qwk in 4 weeks (Patient taking differently: Inject 0.25 mg into the skin once a week. Increase to 0.5mg  [name] qwk in 4 weeks Takes 1 mg every week now) 3 mL 2   Cholecalciferol 1.25 MG (50000 UT) TABS 50,000 units PO qwk for 8 weeks. (Patient not taking: Reported on 06/17/2023) 8 tablet 0   ramipril (ALTACE) 10 MG capsule Take 2 capsules (20 mg total) by mouth daily. 180 capsule 3   No current facility-administered medications on file prior to visit.    Review of Systems  Constitutional:  Negative for chills and fever.  Eyes:  Negative for visual disturbance.  Respiratory:  Negative for cough.   Cardiovascular:  Negative  for chest pain and palpitations.  Gastrointestinal:  Negative for nausea and vomiting.      Objective:    BP (!) 128/8   Pulse 75   Temp 97.7 F (36.5 C) (Oral)   Ht 5\' 4"  (1.626 m)   Wt 150 lb 3.2 oz (68.1 kg)   SpO2 95%   BMI 25.78 kg/m   BP Readings from Last 3 Encounters:  06/17/23 (!) 128/8  03/26/23 130/76  12/06/22 126/69   Wt Readings from Last 3 Encounters:  06/17/23 150 lb 3.2 oz (68.1 kg)  06/17/23 148 lb (67.1 kg)  03/26/23 154 lb 12.8 oz (70.2 kg)    Physical Exam Vitals reviewed.  Constitutional:      Appearance: She is well-developed.  Eyes:     General:        Left eye: Hordeolum present.    Conjunctiva/sclera: Conjunctivae normal.     Left  eye: Left conjunctiva is not injected.      Comments: Left lower lobe papule.  Nonfluctuant.  No red streaks, purulent discharge  Cardiovascular:     Rate and Rhythm: Normal rate and regular rhythm.     Pulses: Normal pulses.     Heart sounds: Normal heart sounds.  Pulmonary:     Effort: Pulmonary effort is normal.     Breath sounds: Normal breath sounds. No wheezing, rhonchi or rales.  Skin:    General: Skin is warm and dry.  Neurological:     Mental Status: She is alert.  Psychiatric:        Speech: Speech normal.        Behavior: Behavior normal.        Thought Content: Thought content normal.

## 2023-06-17 NOTE — Assessment & Plan Note (Signed)
Duration 5 days.  Improving with warm compresses.  No symptoms to suggest cellulitis.  Advised continued warm compresses.

## 2023-06-18 LAB — IRON,TIBC AND FERRITIN PANEL
%SAT: 10 % — ABNORMAL LOW (ref 16–45)
Ferritin: 17 ng/mL (ref 16–288)
Iron: 31 ug/dL — ABNORMAL LOW (ref 45–160)
TIBC: 319 ug/dL (ref 250–450)

## 2023-06-18 NOTE — Addendum Note (Signed)
Addended by: [place], Tiffnay Bossi on: 06/18/2023 01:27 PM   Modules accepted: Orders

## 2023-06-19 ENCOUNTER — Encounter: Payer: Self-pay | Admitting: Family

## 2023-06-20 ENCOUNTER — Other Ambulatory Visit: Payer: Self-pay

## 2023-06-20 MED ORDER — SEMAGLUTIDE (1 MG/DOSE) 4 MG/3ML ~~LOC~~ SOPN: 1.0000 mg | PEN_INJECTOR | SUBCUTANEOUS | 3 refills | Status: DC

## 2023-06-26 ENCOUNTER — Encounter: Payer: Self-pay | Admitting: Family

## 2023-06-26 ENCOUNTER — Ambulatory Visit (INDEPENDENT_AMBULATORY_CARE_PROVIDER_SITE_OTHER): Payer: Medicare Other | Admitting: Family

## 2023-06-26 VITALS — BP 130/76 | HR 88 | Temp 98.8°F | Ht 63.0 in | Wt 153.4 lb

## 2023-06-26 DIAGNOSIS — K219 Gastro-esophageal reflux disease without esophagitis: Secondary | ICD-10-CM

## 2023-06-26 DIAGNOSIS — I10 Essential (primary) hypertension: Secondary | ICD-10-CM | POA: Diagnosis not present

## 2023-06-26 DIAGNOSIS — R7309 Other abnormal glucose: Secondary | ICD-10-CM

## 2023-06-26 DIAGNOSIS — E119 Type 2 diabetes mellitus without complications: Secondary | ICD-10-CM | POA: Diagnosis not present

## 2023-06-26 DIAGNOSIS — Z7985 Long-term (current) use of injectable non-insulin antidiabetic drugs: Secondary | ICD-10-CM | POA: Diagnosis not present

## 2023-06-26 DIAGNOSIS — Z23 Encounter for immunization: Secondary | ICD-10-CM | POA: Diagnosis not present

## 2023-06-26 DIAGNOSIS — Z1211 Encounter for screening for malignant neoplasm of colon: Secondary | ICD-10-CM

## 2023-06-26 LAB — POCT GLYCOSYLATED HEMOGLOBIN (HGB A1C): Hemoglobin A1C: 6.2 % — AB (ref 4.0–5.6)

## 2023-06-26 MED ORDER — FAMOTIDINE 20 MG PO TABS: 20.0000 mg | ORAL_TABLET | Freq: Every day | ORAL | 3 refills | Status: DC

## 2023-06-26 NOTE — Assessment & Plan Note (Signed)
Suboptimal control.  Absence of alarm features at this time.  Continue omeprazole 40 mg every morning.  Start Pepcid 20 mg every afternoon.  Reiterated the importance of colonoscopy and endoscopy with a history of esophageal stricture.  Patient will call [facility]

## 2023-06-26 NOTE — Assessment & Plan Note (Signed)
Chronic, stable. continue ramipril 20 mg every day.  Consider hydrochlorothiazide if needed for blood pressure control, leg swelling.

## 2023-06-26 NOTE — Patient Instructions (Signed)
You are overdue for colonoscopy and I would recommend an endoscopy with a history of esophageal stricture.  Please call [facility] to schedule [phone].

## 2023-06-26 NOTE — Assessment & Plan Note (Signed)
Excellent control.  Continue Ozempic 2 mg weekly, 500 mg metformin daily.

## 2023-06-26 NOTE — Progress Notes (Signed)
Assessment & Plan:  Type 2 diabetes mellitus without complication, without long-term current use of insulin (HCC) Assessment & Plan: Excellent control.  Continue Ozempic 2 mg weekly, 500 mg metformin daily.    Elevated glucose -     POCT glycosylated hemoglobin (Hb A1C)  Gastroesophageal reflux disease, unspecified whether esophagitis present Assessment & Plan: Suboptimal control.  Absence of alarm features at this time.  Continue omeprazole 40 mg every morning.  Start Pepcid 20 mg every afternoon.  Reiterated the importance of colonoscopy and endoscopy with a history of esophageal stricture.  Patient will call [facility]  Orders: -     Famotidine; Take 1 tablet (20 mg total) by mouth at bedtime.  Dispense: 90 tablet; Refill: 3 -     Ambulatory referral to Gastroenterology  Screen for colon cancer -     Ambulatory referral to Gastroenterology  Essential hypertension Assessment & Plan: Chronic, stable. continue ramipril 20 mg every day.  Consider hydrochlorothiazide if needed for blood pressure control, leg swelling.   Need for influenza vaccination -     Flu Vaccine Trivalent High Dose (Fluad)     Return precautions given.   Risks, benefits, and alternatives of the medications and treatment plan prescribed today were discussed, and patient expressed understanding.   Education regarding symptom management and diagnosis given to patient on AVS either electronically or printed.  Return in about 3 months (around 09/25/2023).  Rennie Plowman, FNP  Subjective:    Patient ID: Tiffany Gregory, female    DOB: 11/05/1950, 72 y.o.   MRN: [id number]  CC: Tiffany Gregory is a 72 y.o. female who presents today for follow up.   HPI: Complains of increased epigastric burning past couple of weeks  Compliant with omeprazole 40mg  in the morning  Endorses burping  No vomiting, choking, pain with swallowing  H/o esophageal stricture    Follow-up diabetes, anemia, Ferritin  increased from prior, 17   Allergies: Lemon flavor, Amlodipine, Keflex [cephalexin], [name], Neosporin [bacitracin-polymyxin b], Sulfa antibiotics, and Valsartan Current Outpatient Medications on File Prior to Visit  Medication Sig Dispense Refill   acetaminophen (TYLENOL) 325 MG tablet Take 650 mg by mouth every 6 (six) hours as needed.     aspirin EC 81 MG tablet Take 81 mg by mouth daily. Swallow whole.     atorvastatin (LIPITOR) 40 MG tablet Take 1 tablet (40 mg total) by mouth daily. 90 tablet 3   Blood Glucose Monitoring Suppl (ONE TOUCH ULTRA 2) w/Device KIT 1 Device by Does not apply route daily. 1 kit 0   ferrous sulfate 324 MG TBEC Take 324 mg by mouth daily.     fluticasone (FLONASE) 50 MCG/ACT nasal spray SHAKE LIQUID AND USE 2 SPRAYS IN EACH NOSTRIL DAILY 48 g 3   glucose blood test strip 1 each by Other route as needed for other. Use as instructed. One touch ultra blue in vitro strips 100 each 3   metFORMIN (GLUCOPHAGE-XR) 500 MG 24 hr tablet TAKE 1 TABLET(500 MG) BY MOUTH DAILY WITH BREAKFAST 90 tablet 1   omeprazole (PRILOSEC) 20 MG capsule TAKE 2 CAPSULES(40 MG) BY MOUTH DAILY 180 capsule 1   OneTouch Delica Lancets 33G MISC by Does not apply route.     polyethylene glycol (MIRALAX / GLYCOLAX) 17 g packet Take 17 g by mouth daily.     Semaglutide, 1 MG/DOSE, 4 MG/3ML SOPN Inject 1 mg as directed once a week. 3 mL 3   ramipril (ALTACE) 10 MG capsule Take  2 capsules (20 mg total) by mouth daily. 180 capsule 3   No current facility-administered medications on file prior to visit.    Review of Systems  Constitutional:  Negative for chills and fever.  HENT:  Negative for trouble swallowing.   Respiratory:  Negative for cough.   Cardiovascular:  Negative for chest pain and palpitations.  Gastrointestinal:  Negative for nausea and vomiting.      Objective:    BP 130/76   Pulse 88   Temp 98.8 F (37.1 C) (Oral)   Ht 5\' 3"  (1.6 m)   Wt 153 lb 6.4 oz (69.6  kg)   SpO2 98%   BMI 27.17 kg/m  BP Readings from Last 3 Encounters:  06/26/23 130/76  06/17/23 (!) 128/8  03/26/23 130/76   Wt Readings from Last 3 Encounters:  06/26/23 153 lb 6.4 oz (69.6 kg)  06/17/23 150 lb 3.2 oz (68.1 kg)  06/17/23 148 lb (67.1 kg)    Physical Exam Vitals reviewed.  Constitutional:      Appearance: She is well-developed.  Eyes:     Conjunctiva/sclera: Conjunctivae normal.  Cardiovascular:     Rate and Rhythm: Normal rate and regular rhythm.     Pulses: Normal pulses.     Heart sounds: Normal heart sounds.  Pulmonary:     Effort: Pulmonary effort is normal.     Breath sounds: Normal breath sounds. No wheezing, rhonchi or rales.  Skin:    General: Skin is warm and dry.  Neurological:     Mental Status: She is alert.  Psychiatric:        Speech: Speech normal.        Behavior: Behavior normal.        Thought Content: Thought content normal.

## 2023-07-09 ENCOUNTER — Other Ambulatory Visit: Payer: Self-pay | Admitting: Family

## 2023-07-09 DIAGNOSIS — I10 Essential (primary) hypertension: Secondary | ICD-10-CM

## 2023-07-23 ENCOUNTER — Other Ambulatory Visit (INDEPENDENT_AMBULATORY_CARE_PROVIDER_SITE_OTHER): Payer: Medicare Other

## 2023-07-23 DIAGNOSIS — D649 Anemia, unspecified: Secondary | ICD-10-CM | POA: Diagnosis not present

## 2023-07-23 LAB — CBC WITH DIFFERENTIAL/PLATELET
Basophils Absolute: 0 10*3/uL (ref 0.0–0.1)
Basophils Relative: 0.4 % (ref 0.0–3.0)
Eosinophils Absolute: 0.3 10*3/uL (ref 0.0–0.7)
Eosinophils Relative: 2.9 % (ref 0.0–5.0)
HCT: 38.5 % (ref 36.0–46.0)
Hemoglobin: 12.6 g/dL (ref 12.0–15.0)
Lymphocytes Relative: 19.5 % (ref 12.0–46.0)
Lymphs Abs: 2 10*3/uL (ref 0.7–4.0)
MCHC: 32.6 g/dL (ref 30.0–36.0)
MCV: 82.1 fL (ref 78.0–100.0)
Monocytes Absolute: 0.6 10*3/uL (ref 0.1–1.0)
Monocytes Relative: 5.6 % (ref 3.0–12.0)
Neutro Abs: 7.2 10*3/uL (ref 1.4–7.7)
Neutrophils Relative %: 71.6 % (ref 43.0–77.0)
Platelets: 337 10*3/uL (ref 150.0–400.0)
RBC: 4.69 Mil/uL (ref 3.87–5.11)
RDW: 16.6 % — ABNORMAL HIGH (ref 11.5–15.5)
WBC: 10 10*3/uL (ref 4.0–10.5)

## 2023-07-24 LAB — IRON,TIBC AND FERRITIN PANEL
%SAT: 16 % (ref 16–45)
Ferritin: 16 ng/mL (ref 16–288)
Iron: 48 ug/dL (ref 45–160)
TIBC: 307 ug/dL (ref 250–450)

## 2023-08-28 ENCOUNTER — Telehealth: Payer: Self-pay

## 2023-08-28 NOTE — Patient Outreach (Signed)
Attempted to contact patient regarding colorectal exam. Left voicemail for patient to return my call at [phone].  Nicholes Rough, CMA Care Guide [employer]

## 2023-09-12 DIAGNOSIS — H02402 Unspecified ptosis of left eyelid: Secondary | ICD-10-CM | POA: Diagnosis not present

## 2023-09-12 DIAGNOSIS — S065XAA Traumatic subdural hemorrhage with loss of consciousness status unknown, initial encounter: Secondary | ICD-10-CM | POA: Diagnosis not present

## 2023-09-12 DIAGNOSIS — G510 Bell's palsy: Secondary | ICD-10-CM | POA: Diagnosis not present

## 2023-09-18 DIAGNOSIS — H2513 Age-related nuclear cataract, bilateral: Secondary | ICD-10-CM | POA: Diagnosis not present

## 2023-09-18 DIAGNOSIS — G510 Bell's palsy: Secondary | ICD-10-CM | POA: Diagnosis not present

## 2023-09-18 DIAGNOSIS — E119 Type 2 diabetes mellitus without complications: Secondary | ICD-10-CM | POA: Diagnosis not present

## 2023-09-18 LAB — HM DIABETES EYE EXAM

## 2023-09-20 ENCOUNTER — Other Ambulatory Visit: Payer: Self-pay | Admitting: Family

## 2023-09-20 ENCOUNTER — Encounter: Payer: Self-pay | Admitting: Family

## 2023-09-20 DIAGNOSIS — K219 Gastro-esophageal reflux disease without esophagitis: Secondary | ICD-10-CM

## 2023-09-23 ENCOUNTER — Encounter: Payer: Self-pay | Admitting: Family

## 2023-10-18 ENCOUNTER — Other Ambulatory Visit: Payer: Self-pay | Admitting: Family

## 2023-11-01 ENCOUNTER — Other Ambulatory Visit: Payer: Self-pay | Admitting: Family

## 2023-11-01 DIAGNOSIS — E119 Type 2 diabetes mellitus without complications: Secondary | ICD-10-CM

## 2023-12-16 ENCOUNTER — Encounter: Payer: Self-pay | Admitting: Family

## 2023-12-17 MED ORDER — SEMAGLUTIDE (2 MG/DOSE) 8 MG/3ML ~~LOC~~ SOPN: 2.0000 mg | PEN_INJECTOR | SUBCUTANEOUS | 2 refills | Status: DC

## 2023-12-17 NOTE — Telephone Encounter (Signed)
 Sent in 2 mg d/c the 1 mg scheduled pt for dm f/up pt is aware

## 2023-12-19 ENCOUNTER — Other Ambulatory Visit: Payer: Self-pay | Admitting: Family

## 2023-12-19 DIAGNOSIS — E1169 Type 2 diabetes mellitus with other specified complication: Secondary | ICD-10-CM

## 2023-12-26 ENCOUNTER — Ambulatory Visit: Admitting: Family

## 2023-12-26 ENCOUNTER — Encounter: Payer: Self-pay | Admitting: Family

## 2023-12-26 VITALS — BP 128/86 | HR 80 | Temp 97.7°F | Ht 64.0 in | Wt 145.6 lb

## 2023-12-26 DIAGNOSIS — E785 Hyperlipidemia, unspecified: Secondary | ICD-10-CM | POA: Diagnosis not present

## 2023-12-26 DIAGNOSIS — R7309 Other abnormal glucose: Secondary | ICD-10-CM

## 2023-12-26 DIAGNOSIS — E119 Type 2 diabetes mellitus without complications: Secondary | ICD-10-CM

## 2023-12-26 DIAGNOSIS — Z7984 Long term (current) use of oral hypoglycemic drugs: Secondary | ICD-10-CM

## 2023-12-26 DIAGNOSIS — Z7985 Long-term (current) use of injectable non-insulin antidiabetic drugs: Secondary | ICD-10-CM | POA: Diagnosis not present

## 2023-12-26 LAB — POCT GLYCOSYLATED HEMOGLOBIN (HGB A1C): Hemoglobin A1C: 5.9 % — AB (ref 4.0–5.6)

## 2023-12-26 NOTE — Assessment & Plan Note (Signed)
 Excellent control.  Continue Ozempic 2 mg weekly, 500 mg metformin daily.  Discussed to monitor weight and if weight < 140lbs, we would decrease ozempic to 1mg .

## 2023-12-26 NOTE — Patient Instructions (Signed)
Nice to see you!   

## 2023-12-26 NOTE — Assessment & Plan Note (Signed)
 Chronic, stable.  Conitinue lipitor 40mg .

## 2023-12-26 NOTE — Progress Notes (Signed)
 Assessment & Plan:  Elevated glucose -     POCT glycosylated hemoglobin (Hb A1C)  Type 2 diabetes mellitus without complication, without long-term current use of insulin (HCC) Assessment & Plan: Excellent control.  Continue Ozempic 2 mg weekly, 500 mg metformin daily.  Discussed to monitor weight and if weight < 140lbs, we would decrease ozempic to 1mg .    Hyperlipidemia, unspecified hyperlipidemia type Assessment & Plan: Chronic, stable.  Conitinue lipitor 40mg .       Return precautions given.   Risks, benefits, and alternatives of the medications and treatment plan prescribed today were discussed, and patient expressed understanding.   Education regarding symptom management and diagnosis given to patient on AVS either electronically or printed.  Return in about 4 months (around 04/26/2024).  Rennie Plowman, FNP  Subjective:    Patient ID: Tiffany Gregory, female    DOB: 11-04-1950, 73 y.o.   MRN: [id number]  CC: Tiffany Gregory is a 73 y.o. female who presents today for follow up.   HPI: She is compliant with increased dose of Ozempic 2 mg.  She is pleased with weight and weight loss. She would like to keep weight stable between 140-145lbs    She has h/o IBS-C which is managed with daily miralax. Constipation is not worse on ozempic.    No family h/o colon cancer  No changes in bowel habits, blood in stool, narrow stool.   Colonoscopy is 1991 and she reports no colon polyps . She has had 2 subsequent colonoscopies since without polyps.   She will let me know when ready to schedule colonoscopy.    No longer on iron.    Allergies: Lemon flavoring agent (non-screening), Amlodipine, Keflex [cephalexin], [name], Neosporin [bacitracin-polymyxin b], Sulfa antibiotics, and Valsartan Current Outpatient Medications on File Prior to Visit  Medication Sig Dispense Refill   acetaminophen (TYLENOL) 325 MG tablet Take 650 mg by mouth every 6 (six) hours as  needed.     aspirin EC 81 MG tablet Take 81 mg by mouth daily. Swallow whole.     atorvastatin (LIPITOR) 40 MG tablet TAKE 1 TABLET(40 MG) BY MOUTH DAILY 90 tablet 3   Blood Glucose Monitoring Suppl (ONE TOUCH ULTRA 2) w/Device KIT 1 Device by Does not apply route daily. 1 kit 0   famotidine (PEPCID) 20 MG tablet Take 1 tablet (20 mg total) by mouth at bedtime. 90 tablet 3   fluticasone (FLONASE) 50 MCG/ACT nasal spray SHAKE LIQUID AND USE 2 SPRAYS IN EACH NOSTRIL DAILY 48 g 3   glucose blood test strip 1 each by Other route as needed for other. Use as instructed. One touch ultra blue in vitro strips 100 each 3   metFORMIN (GLUCOPHAGE-XR) 500 MG 24 hr tablet TAKE 1 TABLET(500 MG) BY MOUTH DAILY WITH BREAKFAST 90 tablet 1   omeprazole (PRILOSEC) 20 MG capsule TAKE 2 CAPSULES(40 MG) BY MOUTH DAILY 180 capsule 1   OneTouch Delica Lancets 33G MISC by Does not apply route.     polyethylene glycol (MIRALAX / GLYCOLAX) 17 g packet Take 17 g by mouth daily.     ramipril (ALTACE) 10 MG capsule TAKE 2 CAPSULES(20 MG) BY MOUTH DAILY 180 capsule 3   Semaglutide, 2 MG/DOSE, 8 MG/3ML SOPN Inject 2 mg as directed once a week. 3 mL 2   No current facility-administered medications on file prior to visit.    Review of Systems  Constitutional:  Negative for chills and fever.  Respiratory:  Negative for cough.  Cardiovascular:  Negative for chest pain and palpitations.  Gastrointestinal:  Positive for constipation (chronic). Negative for nausea and vomiting.      Objective:    BP 128/86   Pulse 80   Temp 97.7 F (36.5 C) (Oral)   Ht 5\' 4"  (1.626 m)   Wt 145 lb 9.6 oz (66 kg)   SpO2 99%   BMI 24.99 kg/m  BP Readings from Last 3 Encounters:  12/26/23 128/86  06/26/23 130/76  06/17/23 (!) 128/8   Wt Readings from Last 3 Encounters:  12/26/23 145 lb 9.6 oz (66 kg)  06/26/23 153 lb 6.4 oz (69.6 kg)  06/17/23 150 lb 3.2 oz (68.1 kg)    Physical Exam Vitals reviewed.  Constitutional:       Appearance: She is well-developed.  Eyes:     Conjunctiva/sclera: Conjunctivae normal.  Cardiovascular:     Rate and Rhythm: Normal rate and regular rhythm.     Pulses: Normal pulses.     Heart sounds: Normal heart sounds.  Pulmonary:     Effort: Pulmonary effort is normal.     Breath sounds: Normal breath sounds. No wheezing, rhonchi or rales.  Skin:    General: Skin is warm and dry.  Neurological:     Mental Status: She is alert.  Psychiatric:        Speech: Speech normal.        Behavior: Behavior normal.        Thought Content: Thought content normal.

## 2024-02-17 ENCOUNTER — Ambulatory Visit: Payer: Self-pay

## 2024-02-17 ENCOUNTER — Encounter: Payer: Self-pay | Admitting: Family

## 2024-02-17 NOTE — Telephone Encounter (Signed)
  Chief Complaint: immunization reaction Symptoms: left bicep redness/swelling/pain/warmth, itchy, rash with bumps "looks like bites" under right arm and on left shoulder, fatigue. Frequency: x 5 days Pertinent Negatives: Patient denies fever, SOB, facial swelling Disposition: [] ED /[] Urgent Care (no appt availability in office) / [x] Appointment(In office/virtual)/ []  [facility]/ [] Home Care/ [] Refused Recommended Disposition /[] [facility]/ []  Follow-up with PCP Additional Notes: Patient received shingles vaccination to left bicep on Thursday. She states by Thursday evening she started to develop symptoms. Advised patient to apply a cool compress to injection site and take Tylenol or ibuprofen for pain and to call back for new or worsening symptoms.  Copied from CRM [id number]. Topic: Clinical - Red Word Triage >> Feb 17, 2024  3:50 PM Adonis Hoot wrote: Red Word that prompted transfer to Nurse Triage: shingles vaccine Red,itchy,hot to touch,sore Reason for Disposition  [1] Over 3 days (72 hours) since shot AND [2] redness, swelling or pain getting worse  Answer Assessment - Initial Assessment Questions 1. SYMPTOMS: "What is the main symptom?" (e.g., redness, swelling, pain)      Redness, itchy, warmth, soreness/pain to injection site in left arm/bicep.  2. ONSET: "When was the vaccine (shot) given?" "How much later did the symptoms begin?" (e.g., hours, days ago)      Thursday. Symptoms began by Thursday night.  3. SEVERITY: "How bad is it?"      "Annoying".  4. FEVER: "Is there a fever?" If Yes, ask: "What is it, how was it measured, and when did it start?"      Denies.  5. IMMUNIZATIONS GIVEN: "What shots have you recently received?"     Shingles.  6. PAST REACTIONS: "Have you reacted to immunizations before?" If Yes, ask: "What happened?"     No.  7. OTHER SYMPTOMS: "Do you have any other symptoms?"     tchy, rash with bumps "looks like bites" under right arm  and on left shoulder, fatigue.  Protocols used: Immunization Reactions-A-AH

## 2024-02-18 NOTE — Telephone Encounter (Signed)
 Spoke to pt she stated that she is feeling better and does not need to come into office to be seen she asked me to cancel her appt she had on 5?15/25 with Dr Casimir Cleaver. She stated that if she felt the need to come in she would call back and schedule appt

## 2024-02-19 ENCOUNTER — Ambulatory Visit

## 2024-03-06 ENCOUNTER — Other Ambulatory Visit: Payer: Self-pay | Admitting: Family

## 2024-03-23 ENCOUNTER — Other Ambulatory Visit: Payer: Self-pay | Admitting: Family

## 2024-03-23 DIAGNOSIS — K219 Gastro-esophageal reflux disease without esophagitis: Secondary | ICD-10-CM

## 2024-03-30 ENCOUNTER — Other Ambulatory Visit: Payer: Self-pay

## 2024-03-30 ENCOUNTER — Encounter: Payer: Self-pay | Admitting: Intensive Care

## 2024-03-30 ENCOUNTER — Emergency Department

## 2024-03-30 ENCOUNTER — Emergency Department: Admission: EM | Admit: 2024-03-30 | Discharge: 2024-03-30 | Disposition: A | Discharge status: Home / Self Care

## 2024-03-30 ENCOUNTER — Ambulatory Visit: Payer: Self-pay

## 2024-03-30 DIAGNOSIS — E119 Type 2 diabetes mellitus without complications: Secondary | ICD-10-CM | POA: Diagnosis not present

## 2024-03-30 DIAGNOSIS — R1011 Right upper quadrant pain: Secondary | ICD-10-CM | POA: Insufficient documentation

## 2024-03-30 DIAGNOSIS — R0789 Other chest pain: Secondary | ICD-10-CM | POA: Diagnosis not present

## 2024-03-30 DIAGNOSIS — R0781 Pleurodynia: Secondary | ICD-10-CM | POA: Diagnosis not present

## 2024-03-30 DIAGNOSIS — J45909 Unspecified asthma, uncomplicated: Secondary | ICD-10-CM | POA: Diagnosis not present

## 2024-03-30 DIAGNOSIS — R110 Nausea: Secondary | ICD-10-CM | POA: Diagnosis not present

## 2024-03-30 DIAGNOSIS — I10 Essential (primary) hypertension: Secondary | ICD-10-CM | POA: Diagnosis not present

## 2024-03-30 LAB — COMPREHENSIVE METABOLIC PANEL WITH GFR
ALT: 13 U/L (ref 0–44)
AST: 17 U/L (ref 15–41)
Albumin: 4.1 g/dL (ref 3.5–5.0)
Alkaline Phosphatase: 69 U/L (ref 38–126)
Anion gap: 11 (ref 5–15)
BUN: 10 mg/dL (ref 8–23)
CO2: 25 mmol/L (ref 22–32)
Calcium: 9.3 mg/dL (ref 8.9–10.3)
Chloride: 102 mmol/L (ref 98–111)
Creatinine, Ser: 0.66 mg/dL (ref 0.44–1.00)
GFR, Estimated: >60 mL/min (ref >60)
Glucose, Bld: 120 mg/dL — ABNORMAL HIGH (ref 70–99)
Potassium: 3.7 mmol/L (ref 3.5–5.1)
Sodium: 138 mmol/L (ref 135–145)
Total Bilirubin: 0.5 mg/dL (ref 0.0–1.2)
Total Protein: 7.9 g/dL (ref 6.5–8.1)

## 2024-03-30 LAB — URINALYSIS, ROUTINE W REFLEX MICROSCOPIC
Bilirubin Urine: NEGATIVE
Glucose, UA: NEGATIVE mg/dL
Hgb urine dipstick: NEGATIVE
Ketones, ur: NEGATIVE mg/dL
Leukocytes,Ua: NEGATIVE
Nitrite: NEGATIVE
Protein, ur: NEGATIVE mg/dL
Specific Gravity, Urine: 1.016 (ref 1.005–1.030)
pH: 6 (ref 5.0–8.0)

## 2024-03-30 LAB — CBC
HCT: 36.3 % (ref 36.0–46.0)
Hemoglobin: 12.1 g/dL (ref 12.0–15.0)
MCH: 27 pg (ref 26.0–34.0)
MCHC: 33.3 g/dL (ref 30.0–36.0)
MCV: 81 fL (ref 80.0–100.0)
Platelets: 323 10*3/uL (ref 150–400)
RBC: 4.48 MIL/uL (ref 3.87–5.11)
RDW: 14.6 % (ref 11.5–15.5)
WBC: 9.4 10*3/uL (ref 4.0–10.5)
nRBC: 0 % (ref 0.0–0.2)

## 2024-03-30 LAB — LIPASE, BLOOD: Lipase: 50 U/L (ref 11–51)

## 2024-03-30 MED ORDER — CYCLOBENZAPRINE HCL 5 MG PO TABS: 5.0000 mg | ORAL_TABLET | Freq: Three times a day (TID) | ORAL | 0 refills | Status: DC | PRN

## 2024-03-30 MED ORDER — ONDANSETRON 4 MG PO TBDP: 4.0000 mg | ORAL_TABLET | Freq: Three times a day (TID) | ORAL | 0 refills | Status: DC | PRN

## 2024-03-30 NOTE — Telephone Encounter (Signed)
 Attempt X2 to call back pt regard s/s noted.  No response noted.  Left discrete VM appropriately.

## 2024-03-30 NOTE — Telephone Encounter (Signed)
 Spoke to pt she went to [facility] they sent her to ED pt was there when I spoke to her she scheduled f/up with [facility] on 04/04/24

## 2024-03-30 NOTE — ED Triage Notes (Signed)
 Patient c/o right abdominal pain and nausea. Started yesterday.  Denies urinary symptoms. Denies diarrhea   Has been on ozempic  for over a year

## 2024-03-30 NOTE — Telephone Encounter (Signed)
 1st attempt, [id number]  Copied from CRM [id number]. Topic: Clinical - Medical Advice >> Mar 30, 2024 10:19 AM Tiffany Gregory wrote: Reason for CRM: Patient is having right side pain she is scheduled for tomorrow but wants medical advice

## 2024-03-30 NOTE — ED Provider Notes (Signed)
 [facility] Emergency Department Provider Note     Event Date/Time   First MD Initiated Contact with Patient 03/30/24 1439     (approximate)   History   Nausea and Abdominal Pain   HPI  Tiffany Gregory is a 73 y.o. female with a history of DM type II, HLD, HTN, and asthma, presents to the ED endorsing right abdominal pain and nausea.  She notes onset of symptoms yesterday.  She denies any associated fever, chills, or sweats.  No reports of any dysuria, hematuria, urinary retention, bowel changes reported.  Physical Exam   Triage Vital Signs: ED Triage Vitals  Encounter Vitals Group     BP 03/30/24 1236 139/74     Girls Systolic BP Percentile --      Girls Diastolic BP Percentile --      Boys Systolic BP Percentile --      Boys Diastolic BP Percentile --      Pulse Rate 03/30/24 1236 83     Resp 03/30/24 1236 16     Temp 03/30/24 1236 98.3 F (36.8 C)     Temp Source 03/30/24 1236 Oral     SpO2 03/30/24 1236 100 %     Weight 03/30/24 1237 142 lb (64.4 kg)     Height 03/30/24 1237 5' 5 (1.651 m)     Head Circumference --      Peak Flow --      Pain Score 03/30/24 1237 5     Pain Loc --      Pain Education --      Exclude from Growth Chart --     Most recent vital signs: Vitals:   03/30/24 1515 03/30/24 1736  BP: (!) 128/59 138/66  Pulse: 82 74  Resp: 18 18  Temp:  98.1 F (36.7 C)  SpO2: 100% 100%    General Awake, no distress. NAD HEENT NCAT. PERRL. EOMI. No rhinorrhea. Mucous membranes are moist.  CV:  Good peripheral perfusion. RRR RESP:  Normal effort. CTA ABD:  No distention.  Soft and nontender.  No rebound, guarding, or rigidity noted.  No organomegaly appreciated.  No active bowel sounds x 4.  ED Results / Procedures / Treatments   Labs (all labs ordered are listed, but only abnormal results are displayed) Labs Reviewed  COMPREHENSIVE METABOLIC PANEL WITH GFR - Abnormal; Notable for the following components:       Result Value   Glucose, Bld 120 (*)    All other components within normal limits  URINALYSIS, ROUTINE W REFLEX MICROSCOPIC - Abnormal; Notable for the following components:   Color, Urine YELLOW (*)    APPearance CLEAR (*)    All other components within normal limits  LIPASE, BLOOD  CBC    EKG   RADIOLOGY  I personally viewed and evaluated these images as part of my medical decision making, as well as reviewing the written report by the radiologist.  ED Provider Interpretation: No acute findings on upper abdominal ultrasound or DG chest with ribs.  US  Abdomen Limited RUQ (LIVER/GB) Result Date: 03/30/2024 CLINICAL DATA:  Right upper quadrant abdominal pain. EXAM: ULTRASOUND ABDOMEN LIMITED RIGHT UPPER QUADRANT COMPARISON:  None Available. FINDINGS: Gallbladder: No gallstones or wall thickening visualized. No sonographic Murphy sign noted by sonographer. Common bile duct: Diameter: 3 mm. Liver: Mildly increased parenchymal echogenicity may be consistent with some degree of steatosis. No focal lesion or biliary dilatation identified. Portal vein is patent on color Doppler imaging with  normal direction of blood flow towards the liver. Other: None. IMPRESSION: Mildly increased hepatic parenchymal echogenicity may be consistent with some degree of steatosis. No other abnormality identified. Electronically Signed   By: Marcey Moan M.D.   On: 03/30/2024 16:49   DG Ribs Unilateral W/Chest Right Result Date: 03/30/2024 CLINICAL DATA:  Lower anterior rib pain EXAM: RIGHT RIBS AND CHEST - 3+ VIEW COMPARISON:  03/23/2022 FINDINGS: No fracture or other bone lesions are seen involving the ribs. There is no evidence of pneumothorax or pleural effusion. Both lungs are clear. Heart size and mediastinal contours are within normal limits. IMPRESSION: Negative. Electronically Signed   By: Franky Crease M.D.   On: 03/30/2024 16:19    PROCEDURES:  Critical Care performed: No  Procedures   MEDICATIONS  ORDERED IN ED: Medications - No data to display   IMPRESSION / MDM / ASSESSMENT AND PLAN / ED COURSE  I reviewed the triage vital signs and the nursing notes.                              Differential diagnosis includes, but is not limited to, acute appendicitis, diverticulitis, urinary tract infection/pyelonephritis, bowel obstruction, colitis, renal colic, gastroenteritis, hernia, etc.  Patient's presentation is most consistent with acute presentation with potential threat to life or bodily function.  Patient's diagnosis is consistent with right sided anterior chest wall pain/right upper quadrant abdominal pain.  Patient presents with concerns for tenderness along the anterior right rib border versus the right upper quadrant.  No trauma, injury, infection, cough reported.  Exam is overall reassuring without evidence of any acute musculoskeletal injury.  Labs are flat without evidence of any elevated lipase or liver function enzymes.  Due to the locale of the patient symptoms, we proceeded with right upper quadrant ultrasound.  No evidence of any acute hepatic or gallbladder findings.  Chest x-ray interpreted and reviewed by me negative for any acute thoracic process or acute chest wall injury.  Patient is reassured by her normal exam and workup at this time.  Reproducible pain likely representing some mild costochondritis.  Patient will be discharged home with instructions to take OTC Tylenol and Motrin along with OTC lidocaine patches. Patient is to follow up with her PCP as suggested as needed or otherwise directed. Patient is given ED precautions to return to the ED for any worsening or new symptoms.   FINAL CLINICAL IMPRESSION(S) / ED DIAGNOSES   Final diagnoses:  Right upper quadrant abdominal pain  Right-sided chest wall pain     Rx / DC Orders   ED Discharge Orders          Ordered    ondansetron (ZOFRAN-ODT) 4 MG disintegrating tablet  Every 8 hours PRN        03/30/24 1537     cyclobenzaprine  (FLEXERIL ) 5 MG tablet  3 times daily PRN        03/30/24 1738             Note:  This document was prepared using Dragon voice recognition software and may include unintentional dictation errors.    Loyd Candida LULLA Aldona, PA-C 03/30/24 1918    Clarine Ozell LABOR, MD 04/01/24 [phone]

## 2024-03-30 NOTE — Telephone Encounter (Signed)
 Attempt X3 to call back pt regard s/s noted.   No response noted.   Left discrete VM appropriately

## 2024-03-31 ENCOUNTER — Ambulatory Visit: Admitting: Internal Medicine

## 2024-03-31 ENCOUNTER — Ambulatory Visit

## 2024-04-01 ENCOUNTER — Telehealth: Payer: Self-pay | Admitting: Family

## 2024-04-01 NOTE — Telephone Encounter (Signed)
 Patient need lab orders please

## 2024-04-02 NOTE — Telephone Encounter (Signed)
 Lab appt cancelled pt is aware

## 2024-04-03 ENCOUNTER — Ambulatory Visit (INDEPENDENT_AMBULATORY_CARE_PROVIDER_SITE_OTHER): Admitting: Family

## 2024-04-03 ENCOUNTER — Encounter: Payer: Self-pay | Admitting: Family

## 2024-04-03 VITALS — BP 124/82 | HR 89 | Temp 97.9°F | Ht 64.0 in | Wt 140.4 lb

## 2024-04-03 DIAGNOSIS — R0781 Pleurodynia: Secondary | ICD-10-CM | POA: Diagnosis not present

## 2024-04-03 DIAGNOSIS — E119 Type 2 diabetes mellitus without complications: Secondary | ICD-10-CM

## 2024-04-03 DIAGNOSIS — Z7985 Long-term (current) use of injectable non-insulin antidiabetic drugs: Secondary | ICD-10-CM

## 2024-04-03 DIAGNOSIS — Z7984 Long term (current) use of oral hypoglycemic drugs: Secondary | ICD-10-CM | POA: Diagnosis not present

## 2024-04-03 LAB — POCT GLYCOSYLATED HEMOGLOBIN (HGB A1C): Hemoglobin A1C: 5.8 % — AB (ref 4.0–5.6)

## 2024-04-03 NOTE — Patient Instructions (Addendum)
 For nausea , consider dose reduction of ozempic .   Consider starting pepcid  ac over the counter  in the morning in addition to omeprazole  40mg .    Etiology unclear however we jointly agreed more likely musculoskeletal in nature.  Start tylenol 1000mg  twice daily , topical Voltaren gel, heat/ice.   If not improvement in 3-4 days, we agreed to pursue CT of abdomen .   Please let me know if right side pain or nausea persists

## 2024-04-03 NOTE — Assessment & Plan Note (Signed)
 Reviewed ED course. Reproducible on exam.  Etiology unclear however we jointly agreed more likely MS in nature.  Advised tylenol 1000mg  BID, topical Voltaren gel, heat/ice.  If not improvement in 3-4 days, we agreed to pursue CT a/p.

## 2024-04-03 NOTE — Assessment & Plan Note (Signed)
 Excellent control.  Continue Ozempic  2 mg weekly, 500 mg metformin  daily.  She declines decreasing ozempic  in setting of episodic nausea without vomiting. Advised to start pepcid  ac otc in addition to PPI if sx r/t GERD.  Discussed to monitor weight and if weight < 140lbs, we will decrease ozempic  to 1mg .

## 2024-04-03 NOTE — Progress Notes (Unsigned)
 Assessment & Plan:  Rib pain on right side Assessment & Plan: Reviewed ED course. Reproducible on exam.  Etiology unclear however we jointly agreed more likely MS in nature.  Advised tylenol 1000mg  BID, topical Voltaren gel, heat/ice.  If not improvement in 3-4 days, we agreed to pursue CT a/p.    Type 2 diabetes mellitus without complication, without long-term current use of insulin (HCC) Assessment & Plan: Excellent control.  Continue Ozempic  2 mg weekly, 500 mg metformin  daily.  She declines decreasing ozempic  in setting of episodic nausea without vomiting. Advised to start pepcid  ac otc in addition to PPI if sx r/t GERD.  Discussed to monitor weight and if weight < 140lbs, we will decrease ozempic  to 1mg .       Return precautions given.   Risks, benefits, and alternatives of the medications and treatment plan prescribed today were discussed, and patient expressed understanding.   Education regarding symptom management and diagnosis given to patient on AVS either electronically or printed.  No follow-ups on file.  Rollene Northern, FNP  Subjective:    Patient ID: Arland Satterfield, female    DOB: November 28, 1950, 73 y.o.   MRN: [id number]  CC: Yacine Droz is a 73 y.o. female who presents today for follow up.   HPI: RUQ pain is still present, however 'not as severe'. Remains in an isolated spot.  She noted that rearranged her closet the day prior to onset of pain. She moved her clothes. 'nothing heavy'.  She has nausea that started 11:30 am after she had been cleaning.  RUQ pain started suddenly a couple hours later after a couple of chicken nuggets from [employer] and sweet tea.  At first pain was only when she 'moved in a certain direction'.  She couldn't lay on the right side as painful when sleeping. The following morning she had nausea.  No associated pain or trouble swallowing, vomiting, dysuria, rash, fever, pulsating sensation, numbness.   She never started flexeril  ,  zofran , or pepcid  from ED  Due colonoscopy  H/o of EGD however she reports d/t choking; she was unable to have eosphagus dilated due to curvature.   Compliant with Ozempic  2 mg weekly, 500 mg metformin  daily.   She hasn't been nausea on ozempic . She is taking omeprazole  20mg  every day.  She takes miralax daily.  Seen in the ed 03/30/24 for RUQ pain RUQ US   Mildly increased parenchymal echogenicity may be consistent with some degree of steatosis. No focal lesion or biliary dilatation identified.  Right xr ribs negative Lipase 50 WBC 9.4 UA negative RBCs Started on zofran  and flexeril   Allergies: Lemon flavoring agent (non-screening), Amlodipine , Keflex  [cephalexin ], Mango flavor [flavoring agent], [name], Neosporin [bacitracin-polymyxin b], Sulfa antibiotics, and Valsartan  Current Outpatient Medications on File Prior to Visit  Medication Sig Dispense Refill   acetaminophen (TYLENOL) 325 MG tablet Take 650 mg by mouth every 6 (six) hours as needed.     aspirin EC 81 MG tablet Take 81 mg by mouth daily. Swallow whole.     atorvastatin  (LIPITOR) 40 MG tablet TAKE 1 TABLET(40 MG) BY MOUTH DAILY 90 tablet 3   Blood Glucose Monitoring Suppl (ONE TOUCH ULTRA 2) w/Device KIT 1 Device by Does not apply route daily. 1 kit 0   cyclobenzaprine  (FLEXERIL ) 5 MG tablet Take 1 tablet (5 mg total) by mouth 3 (three) times daily as needed. 15 tablet 0   fluticasone  (FLONASE ) 50 MCG/ACT nasal spray SHAKE LIQUID AND USE 2 SPRAYS IN EACH NOSTRIL  DAILY 48 g 3   glucose blood test strip 1 each by Other route as needed for other. Use as instructed. One touch ultra blue in vitro strips 100 each 3   metFORMIN  (GLUCOPHAGE -XR) 500 MG 24 hr tablet TAKE 1 TABLET(500 MG) BY MOUTH DAILY WITH BREAKFAST 90 tablet 1   omeprazole  (PRILOSEC) 20 MG capsule TAKE 2 CAPSULES(40 MG) BY MOUTH DAILY 180 capsule 1   OneTouch Delica Lancets 33G MISC by Does not apply route.     OZEMPIC , 2 MG/DOSE, 8 MG/3ML SOPN INJECT  2MG  INTO SKIN ONCE WEEKLY 3 mL 2   polyethylene glycol (MIRALAX / GLYCOLAX) 17 g packet Take 17 g by mouth daily.     ramipril  (ALTACE ) 10 MG capsule TAKE 2 CAPSULES(20 MG) BY MOUTH DAILY 180 capsule 3   famotidine  (PEPCID ) 20 MG tablet Take 1 tablet (20 mg total) by mouth at bedtime. (Patient not taking: Reported on 04/03/2024) 90 tablet 3   ondansetron  (ZOFRAN -ODT) 4 MG disintegrating tablet Take 1 tablet (4 mg total) by mouth every 8 (eight) hours as needed for nausea or vomiting. (Patient not taking: Reported on 04/03/2024) 15 tablet 0   No current facility-administered medications on file prior to visit.    Review of Systems  Constitutional:  Negative for chills and fever.  HENT:  Negative for congestion.   Respiratory:  Negative for cough, shortness of breath and wheezing.   Cardiovascular:  Negative for chest pain and palpitations.  Gastrointestinal:  Positive for constipation (chronic) and nausea. Negative for abdominal pain, anal bleeding and vomiting.  Genitourinary:  Negative for dysuria.  Musculoskeletal:  Negative for back pain.      Objective:    BP 124/82   Pulse 89   Temp 97.9 F (36.6 C) (Oral)   Ht 5' 4 (1.626 m)   Wt 140 lb 6.4 oz (63.7 kg)   SpO2 98%   BMI 24.10 kg/m  BP Readings from Last 3 Encounters:  04/03/24 124/82  03/30/24 138/66  12/26/23 128/86   Wt Readings from Last 3 Encounters:  04/03/24 140 lb 6.4 oz (63.7 kg)  03/30/24 142 lb (64.4 kg)  12/26/23 145 lb 9.6 oz (66 kg)    Physical Exam Vitals reviewed.  Constitutional:      Appearance: She is well-developed.   Eyes:     Conjunctiva/sclera: Conjunctivae normal.    Cardiovascular:     Rate and Rhythm: Normal rate and regular rhythm.     Pulses: Normal pulses.     Heart sounds: Normal heart sounds.  Pulmonary:     Effort: Pulmonary effort is normal.     Breath sounds: Normal breath sounds. No wheezing, rhonchi or rales.  Chest:     Chest wall: No mass or tenderness.       Comments: Focal area of tenderness over right rib. No bony step off, rash, edema.  No pain with deep inspiration.   Skin:    General: Skin is warm and dry.   Neurological:     Mental Status: She is alert.   Psychiatric:        Speech: Speech normal.        Behavior: Behavior normal.        Thought Content: Thought content normal.

## 2024-04-15 ENCOUNTER — Other Ambulatory Visit

## 2024-05-02 ENCOUNTER — Other Ambulatory Visit: Payer: Self-pay | Admitting: Family

## 2024-05-02 DIAGNOSIS — E119 Type 2 diabetes mellitus without complications: Secondary | ICD-10-CM

## 2024-06-01 ENCOUNTER — Other Ambulatory Visit: Payer: Self-pay | Admitting: Family

## 2024-06-17 ENCOUNTER — Encounter: Payer: Self-pay | Admitting: *Deleted

## 2024-06-17 ENCOUNTER — Encounter: Payer: Self-pay | Admitting: Family

## 2024-06-17 DIAGNOSIS — G510 Bell's palsy: Secondary | ICD-10-CM | POA: Diagnosis not present

## 2024-06-17 DIAGNOSIS — H538 Other visual disturbances: Secondary | ICD-10-CM | POA: Diagnosis not present

## 2024-06-17 DIAGNOSIS — H2513 Age-related nuclear cataract, bilateral: Secondary | ICD-10-CM | POA: Diagnosis not present

## 2024-06-17 DIAGNOSIS — H35363 Drusen (degenerative) of macula, bilateral: Secondary | ICD-10-CM | POA: Diagnosis not present

## 2024-06-17 DIAGNOSIS — E119 Type 2 diabetes mellitus without complications: Secondary | ICD-10-CM | POA: Diagnosis not present

## 2024-06-18 ENCOUNTER — Other Ambulatory Visit: Payer: Self-pay | Admitting: Family

## 2024-06-18 DIAGNOSIS — K219 Gastro-esophageal reflux disease without esophagitis: Secondary | ICD-10-CM

## 2024-06-18 DIAGNOSIS — I10 Essential (primary) hypertension: Secondary | ICD-10-CM

## 2024-06-18 NOTE — Telephone Encounter (Signed)
 Pt scheduled

## 2024-06-18 NOTE — Telephone Encounter (Signed)
 Copied from CRM [id number]. Topic: General - Other >> Jun 18, 2024 10:50 AM Pinkey ORN wrote: Reason for CRM: Requesting Call Back >> Jun 18, 2024 10:51 AM Pinkey ORN wrote: Patient is requesting that Norine Skelton, CMA gives her a call back at [phone]

## 2024-06-19 ENCOUNTER — Ambulatory Visit (INDEPENDENT_AMBULATORY_CARE_PROVIDER_SITE_OTHER): Payer: Medicare Other | Admitting: *Deleted

## 2024-06-19 VITALS — Ht 64.0 in | Wt 139.0 lb

## 2024-06-19 DIAGNOSIS — Z0000 Encounter for general adult medical examination without abnormal findings: Secondary | ICD-10-CM

## 2024-06-19 NOTE — Patient Instructions (Signed)
 [name],  Thank you for taking the time for your Medicare Wellness Visit. I appreciate your continued commitment to your health goals. Please review the care plan we discussed, and feel free to reach out if I can assist you further.  Medicare recommends these wellness visits once per year to help you and your care team stay ahead of potential health issues. These visits are designed to focus on prevention, allowing your provider to concentrate on managing your acute and chronic conditions during your regular appointments.  Please note that Annual Wellness Visits do not include a physical exam. Some assessments may be limited, especially if the visit was conducted virtually. If needed, we may recommend a separate in-person follow-up with your provider.  Ongoing Care Seeing your primary care provider every 3 to 6 months helps us  monitor your health and provide consistent, personalized care.  Remember to update your flu and covid vaccines. Consider colorectal cancer screening.   Referrals If a referral was made during today's visit and you haven't received any updates within two weeks, please contact the referred provider directly to check on the status.  Recommended Screenings:  Health Maintenance  Topic Date Due   Yearly kidney health urinalysis for diabetes  Never done   Colon Cancer Screening  Never done   Complete foot exam   03/25/2024   Flu Shot  05/08/2024   COVID-19 Vaccine (5 - 2025-26 season) 06/08/2024   DTaP/Tdap/Td vaccine (1 - Tdap) 12/25/2024*   Eye exam for diabetics  09/17/2024   Hemoglobin A1C  10/03/2024   Yearly kidney function blood test for diabetes  03/30/2025   Breast Cancer Screening  05/21/2025   Medicare Annual Wellness Visit  06/19/2025   Pneumococcal Vaccine for age over 36  Completed   DEXA scan (bone density measurement)  Completed   Hepatitis C Screening  Completed   HPV Vaccine  Aged Out   Meningitis B Vaccine  Aged Out   Hepatitis B Vaccine   Discontinued   Zoster (Shingles) Vaccine  Discontinued  *Topic was postponed. The date shown is not the original due date.       06/19/2024    9:42 AM  Advanced Directives  Does Patient Have a Medical Advance Directive? No  Would patient like information on creating a medical advance directive? No - Patient declined   Advance Care Planning is important because it: Ensures you receive medical care that aligns with your values, goals, and preferences. Provides guidance to your family and loved ones, reducing the emotional burden of decision-making during critical moments.  Vision: Annual vision screenings are recommended for early detection of glaucoma, cataracts, and diabetic retinopathy. These exams can also reveal signs of chronic conditions such as diabetes and high blood pressure.  Dental: Annual dental screenings help detect early signs of oral cancer, gum disease, and other conditions linked to overall health, including heart disease and diabetes.  Please see the attached documents for additional preventive care recommendations.

## 2024-06-19 NOTE — Progress Notes (Signed)
 Subjective:   Tiffany Gregory is a 73 y.o. who presents for a Medicare Wellness preventive visit.  As a reminder, Annual Wellness Visits don't include a physical exam, and some assessments may be limited, especially if this visit is performed virtually. We may recommend an in-person follow-up visit with your provider if needed.  Visit Complete: Virtual I connected with  Tiffany Gregory on 06/19/24 by a audio enabled telemedicine application and verified that I am speaking with the correct person using two identifiers.  Patient Location: Home  Provider Location: Home Office  I discussed the limitations of evaluation and management by telemedicine. The patient expressed understanding and agreed to proceed.  Vital Signs: Because this visit was a virtual/telehealth visit, some criteria may be missing or patient reported. Any vitals not documented were not able to be obtained and vitals that have been documented are patient reported.  VideoDeclined- This patient declined Librarian, academic. Therefore the visit was completed with audio only.  Persons Participating in Visit: Patient.  AWV Questionnaire: Yes: Patient Medicare AWV questionnaire was completed by the patient on 06/12/24; I have confirmed that all information answered by patient is correct and no changes since this date.  Cardiac Risk Factors include: advanced age (>64men, >70 women);diabetes mellitus;hypertension;dyslipidemia     Objective:    Today's Vitals   06/19/24 0924  Weight: 139 lb (63 kg)  Height: 5' 4 (1.626 m)   Body mass index is 23.86 kg/m.     06/19/2024    9:42 AM 03/30/2024   12:40 PM 06/17/2023   11:23 AM 03/23/2022    5:42 PM 03/23/2022    2:15 PM 06/08/2021    4:58 PM 01/15/2021    8:19 PM  Advanced Directives  Does Patient Have a Medical Advance Directive? No No No No No Yes No  Type of Careers adviser;Living will   Does patient want to  make changes to medical advance directive?      No - Patient declined   Copy of Healthcare Power of Attorney in Chart?      No - copy requested   Would patient like information on creating a medical advance directive? No - Patient declined No - Patient declined No - Patient declined No - Patient declined No - Patient declined  No - Patient declined    Current Medications (verified) Outpatient Encounter Medications as of 06/19/2024  Medication Sig   acetaminophen (TYLENOL) 325 MG tablet Take 650 mg by mouth every 6 (six) hours as needed.   aspirin EC 81 MG tablet Take 81 mg by mouth daily. Swallow whole.   atorvastatin  (LIPITOR) 40 MG tablet TAKE 1 TABLET(40 MG) BY MOUTH DAILY   Blood Glucose Monitoring Suppl (ONE TOUCH ULTRA 2) w/Device KIT 1 Device by Does not apply route daily.   cyclobenzaprine  (FLEXERIL ) 5 MG tablet Take 1 tablet (5 mg total) by mouth 3 (three) times daily as needed.   famotidine  (PEPCID ) 20 MG tablet Take 1 tablet (20 mg total) by mouth at bedtime. (Patient taking differently: Take 20 mg by mouth 3 times/day as needed-between meals & bedtime.)   fluticasone  (FLONASE ) 50 MCG/ACT nasal spray SHAKE LIQUID AND USE 2 SPRAYS IN EACH NOSTRIL DAILY (Patient taking differently: daily as needed.)   glucose blood test strip 1 each by Other route as needed for other. Use as instructed. One touch ultra blue in vitro strips   metFORMIN  (GLUCOPHAGE -XR) 500 MG 24 hr tablet  TAKE 1 TABLET(500 MG) BY MOUTH DAILY WITH BREAKFAST   omeprazole  (PRILOSEC) 20 MG capsule TAKE 2 CAPSULES(40 MG) BY MOUTH DAILY   OneTouch Delica Lancets 33G MISC by Does not apply route.   OZEMPIC , 2 MG/DOSE, 8 MG/3ML SOPN INJECT 2MG  INTO SKIN ONCE WEEKLY   polyethylene glycol (MIRALAX / GLYCOLAX) 17 g packet Take 17 g by mouth daily.   ramipril  (ALTACE ) 10 MG capsule TAKE 2 CAPSULES(20 MG) BY MOUTH DAILY   ondansetron  (ZOFRAN -ODT) 4 MG disintegrating tablet Take 1 tablet (4 mg total) by mouth every 8 (eight) hours as  needed for nausea or vomiting. (Patient not taking: Reported on 06/19/2024)   No facility-administered encounter medications on file as of 06/19/2024.    Allergies (verified) Lemon flavoring agent (non-screening), Amlodipine , Keflex  [cephalexin ], Mango flavor [flavoring agent], [name], Neosporin [bacitracin-polymyxin b], Sulfa antibiotics, and Valsartan    History: Past Medical History:  Diagnosis Date   Allergy    Asthma    Diabetes mellitus without complication (HCC)    Elevated liver enzymes    Hepatic steatosis    History of hepatitis B 1971   She had IV drug use experimentation and was hosptialized and reports treated.    Hyperlipidemia    Hypertension    Past Surgical History:  Procedure Laterality Date   ABDOMINAL HYSTERECTOMY     BREAST BIOPSY Left    Biopsy done in [place]    CRANIECTOMY  10/09/2019   slipped on ice, hematoma   NASAL FRACTURE SURGERY  1975   NASAL SINUS SURGERY  2010   Family History  Problem Relation Age of Onset   Diabetes Mother    Heart attack Mother    Diabetes Father    Heart disease Father    Hypertension Father    Diabetes Sister    COPD Sister    Heart disease Sister    Diabetes Brother    Heart attack Brother    Heart disease Brother    Dementia Brother        died at 94   Heart attack Maternal Grandmother    Cancer Maternal Grandfather    Heart attack Paternal Grandmother    Cancer Paternal Grandfather    Colon cancer Neg Hx    Social History   Socioeconomic History   Marital status: Widowed    Spouse name: Loukisha Gunnerson   Number of children: 2   Years of education: 16   Highest education level: Bachelor's degree (e.g., BA, AB, BS)  Occupational History   Occupation: self employed  Tobacco Use   Smoking status: Never    Passive exposure: Past   Smokeless tobacco: Never  Vaping Use   Vaping status: Never Used  Substance and Sexual Activity   Alcohol use: Yes    Comment: rare   Drug use: Never    Sexual activity: Not Currently    Partners: Male  Other Topics Concern   Not on file  Social History Narrative   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    Social Drivers of Corporate investment banker Strain: Low Risk  (06/12/2024)   Overall Financial Resource Strain (CARDIA)    Difficulty of Paying Living Expenses: Not hard at all  Food Insecurity: No Food Insecurity (06/12/2024)   Hunger Vital Sign    Worried About Running Out of Food in the Last Year: Never true    Ran Out  of Food in the Last Year: Never true  Transportation Needs: No Transportation Needs (06/12/2024)   PRAPARE - Administrator, Civil Service (Medical): No    Lack of Transportation (Non-Medical): No  Physical Activity: Insufficiently Active (06/12/2024)   Exercise Vital Sign    Days of Exercise per Week: 4 days    Minutes of Exercise per Session: 30 min  Stress: No Stress Concern Present (06/12/2024)   [employer] of Occupational Health - Occupational Stress Questionnaire    Feeling of Stress: Not at all  Social Connections: Moderately Isolated (06/12/2024)   Social Connection and Isolation Panel    Frequency of Communication with Friends and Family: More than three times a week    Frequency of Social Gatherings with Friends and Family: Once a week    Attends Religious Services: Never    Database administrator or Organizations: Yes    Attends Engineer, structural: More than 4 times per year    Marital Status: Widowed    Tobacco Counseling Counseling given: Not Answered    Clinical Intake:  Pre-visit preparation completed: Yes  Pain : No/denies pain     BMI - recorded: 23.86 Nutritional Risks: None Diabetes: Yes CBG done?: No  Lab Results  Component Value Date   HGBA1C 5.8 (A) 04/03/2024   HGBA1C 5.9 (A) 12/26/2023   HGBA1C 6.2 (A) 06/26/2023     How often do you need to have someone help  you when you read instructions, pamphlets, or other written materials from your doctor or pharmacy?: 1 - Never  Interpreter Needed?: No  Information entered by :: R. Enda Santo LPN   Activities of Daily Living     06/12/2024   11:34 AM  In your present state of health, do you have any difficulty performing the following activities:  Hearing? 0  Vision? 1  Difficulty concentrating or making decisions? 0  Walking or climbing stairs? 0  Dressing or bathing? 0  Doing errands, shopping? 0  Preparing Food and eating ? N  Using the Toilet? N  In the past six months, have you accidently leaked urine? N  Do you have problems with loss of bowel control? N  Managing your Medications? N  Managing your Finances? N  Housekeeping or managing your Housekeeping? N    Patient Care Team: Dineen Rollene MATSU, FNP as PCP - General (Family Medicine) Myrna Adine Anes, MD as Consulting Physician (Ophthalmology) Maree Jannett POUR, MD as Consulting Physician (Neurology) Clois Fret, MD as Consulting Physician (Neurosurgery) Hester Alm BROCKS, MD (Dermatology) Janalyn Keene NOVAK, MD (Inactive) as Consulting Physician (Gastroenterology) [facility], [facility] (Optometry)  I have updated your Care Teams any recent Medical Services you may have received from other providers in the past year.     Assessment:   This is a routine wellness examination for Tiffany Gregory.  Hearing/Vision screen Hearing Screening - Comments:: No issues Vision Screening - Comments:: cataracts   Goals Addressed             This Visit's Progress    Patient Stated       Wants to join an exercise program and take tap dancing lessons       Depression Screen     06/19/2024    9:34 AM 04/03/2024    1:26 PM 12/26/2023    9:24 AM 06/26/2023    9:30 AM 06/17/2023   12:44 PM 06/17/2023   10:52 AM 03/26/2023   11:05 AM  PHQ 2/9 Scores  PHQ - 2 Score 3 0 0 0 0 0 0  PHQ- 9 Score 6   0 0 4     Fall Risk     06/12/2024   11:34 AM  04/03/2024    1:26 PM 12/26/2023    9:24 AM 06/26/2023    9:29 AM 06/17/2023   10:46 AM  Fall Risk   Falls in the past year? 0 0 0 0 0  Number falls in past yr: 0 0 0 0 0  Injury with Fall? 0 0 0 0 0  Risk for fall due to : No Fall Risks No Fall Risks No Fall Risks No Fall Risks No Fall Risks  Follow up Falls evaluation completed;Falls prevention discussed Falls evaluation completed Falls evaluation completed Falls evaluation completed Falls prevention discussed;Falls evaluation completed    MEDICARE RISK AT HOME:  Medicare Risk at Home Any stairs in or around the home?: (Patient-Rptd) Yes If so, are there any without handrails?: (Patient-Rptd) No Home free of loose throw rugs in walkways, pet beds, electrical cords, etc?: (Patient-Rptd) No Adequate lighting in your home to reduce risk of falls?: (Patient-Rptd) Yes Life alert?: (Patient-Rptd) No Use of a cane, walker or w/c?: (Patient-Rptd) No Grab bars in the bathroom?: (Patient-Rptd) No Shower chair or bench in shower?: (Patient-Rptd) No Elevated toilet seat or a handicapped toilet?: (Patient-Rptd) No  TIMED UP AND GO:  Was the test performed?  No  Cognitive Function: 6CIT completed        06/19/2024    9:43 AM 06/17/2023   11:11 AM  6CIT Screen  What Year? 0 points 0 points  What month? 0 points 0 points  What time? 0 points 0 points  Count back from 20 0 points 0 points  Months in reverse 0 points 0 points  Repeat phrase 2 points 2 points  Total Score 2 points 2 points    Immunizations Immunization History  Administered Date(s) Administered   Fluad Quad(high Dose 65+) 07/22/2020   Fluad Trivalent(High Dose 65+) 06/26/2023   Hep A / Hep B 01/10/2021, 02/10/2021   Influenza,inj,Quad PF,6+ Mos 08/09/2022   Influenza-Unspecified 07/17/2018   Moderna Sars-Covid-2 Vaccination 01/13/2020, 02/10/2020, 08/27/2020   PNEUMOCOCCAL CONJUGATE-20 08/09/2022   Pneumococcal Conjugate-13 05/15/2017   Pneumococcal Polysaccharide-23  06/13/2012   Pneumococcal-Unspecified 10/08/2009    Screening Tests Health Maintenance  Topic Date Due   Diabetic kidney evaluation - Urine ACR  Never done   Colonoscopy  Never done   FOOT EXAM  03/25/2024   Zoster Vaccines- Shingrix (2 of 2) 04/09/2024   Influenza Vaccine  05/08/2024   COVID-19 Vaccine (5 - 2025-26 season) 06/08/2024   Medicare Annual Wellness (AWV)  06/16/2024   DTaP/Tdap/Td (1 - Tdap) 12/25/2024 (Originally 01/05/1970)   OPHTHALMOLOGY EXAM  09/17/2024   HEMOGLOBIN A1C  10/03/2024   Diabetic kidney evaluation - eGFR measurement  03/30/2025   Mammogram  05/21/2025   Pneumococcal Vaccine: 50+ Years  Completed   DEXA SCAN  Completed   Hepatitis C Screening  Completed   HPV VACCINES  Aged Out   Meningococcal B Vaccine  Aged Out   Hepatitis B Vaccines 19-59 Average Risk  Discontinued    Health Maintenance Items Addressed: Discussed the need to update flu and covid vaccines. Patient declines colonoscopy at this time.  Patient is not able to take second shingles vaccine because of allergic reaction to the first shot. Patient needs a diabetic foot exam completed and documented at next visit.  Additional Screening:  Vision Screening:  Recommended annual ophthalmology exams for early detection of glaucoma and other disorders of the eye. Is the patient up to date with their annual eye exam?  Yes  Who is the provider or what is the name of the office in which the patient attends annual eye exams?  Eye  Dental Screening: Recommended annual dental exams for proper oral hygiene  Community Resource Referral / Chronic Care Management: CRR required this visit?  No   CCM required this visit?  No   Plan:    I have personally reviewed and noted the following in the patient's chart:   Medical and social history Use of alcohol, tobacco or illicit drugs  Current medications and supplements including opioid prescriptions. Patient is not currently taking opioid  prescriptions. Functional ability and status Nutritional status Physical activity Advanced directives List of other physicians Hospitalizations, surgeries, and ER visits in previous 12 months Vitals Screenings to include cognitive, depression, and falls Referrals and appointments  In addition, I have reviewed and discussed with patient certain preventive protocols, quality metrics, and best practice recommendations. A written personalized care plan for preventive services as well as general preventive health recommendations were provided to patient.   Angeline Fredericks, LPN   [id number]   After Visit Summary: (MyChart) Due to this being a telephonic visit, the after visit summary with patients personalized plan was offered to patient via MyChart   Notes: Nothing significant to report at this time.

## 2024-06-22 ENCOUNTER — Encounter: Payer: Self-pay | Admitting: Family

## 2024-06-24 DIAGNOSIS — H2512 Age-related nuclear cataract, left eye: Secondary | ICD-10-CM | POA: Diagnosis not present

## 2024-06-24 DIAGNOSIS — H2513 Age-related nuclear cataract, bilateral: Secondary | ICD-10-CM | POA: Diagnosis not present

## 2024-06-25 ENCOUNTER — Encounter: Payer: Self-pay | Admitting: Ophthalmology

## 2024-06-25 NOTE — Anesthesia Preprocedure Evaluation (Addendum)
 Anesthesia Evaluation    Airway Mallampati: III  TM Distance: <3 FB Neck ROM: Full   Comment: 1 FB TMD  Dental no notable dental hx.    Pulmonary    Pulmonary exam normal breath sounds clear to auscultation       Cardiovascular hypertension, Normal cardiovascular exam Rhythm:Regular Rate:Normal     Neuro/Psych    GI/Hepatic   Endo/Other  diabetes    Renal/GU      Musculoskeletal   Abdominal   Peds  Hematology   Anesthesia Other Findings Asthma  Diabetes mellitus without complication (HCC) Allergy  History of hepatitis B Elevated liver enzymes  Hepatic steatosis Hypertension  Hyperlipidemia GERD (gastroesophageal reflux disease) Bell's palsy Ptosis, left eyelid  Subdural hematoma (HCC)    Reproductive/Obstetrics                              Anesthesia Physical Anesthesia Plan  ASA: 3  Anesthesia Plan: MAC   Post-op Pain Management:    Induction: Intravenous  PONV Risk Score and Plan:   Airway Management Planned: Natural Airway and Nasal Cannula  Additional Equipment:   Intra-op Plan:   Post-operative Plan:   Informed Consent: I have reviewed the patients History and Physical, chart, labs and discussed the procedure including the risks, benefits and alternatives for the proposed anesthesia with the patient or authorized representative who has indicated his/her understanding and acceptance.     Dental Advisory Given  Plan Discussed with: Anesthesiologist, CRNA and Surgeon  Anesthesia Plan Comments: (Patient consented for risks of anesthesia including but not limited to:  - adverse reactions to medications - damage to eyes, teeth, lips or other oral mucosa - nerve damage due to positioning  - sore throat or hoarseness - Damage to heart, brain, nerves, lungs, other parts of body or loss of life  Patient voiced understanding and assent.)        Anesthesia  Quick Evaluation

## 2024-06-30 ENCOUNTER — Encounter: Payer: Self-pay | Admitting: Ophthalmology

## 2024-06-30 ENCOUNTER — Ambulatory Visit: Admitting: Family

## 2024-06-30 ENCOUNTER — Encounter: Payer: Self-pay | Admitting: Family

## 2024-06-30 VITALS — BP 136/62 | HR 80 | Temp 97.8°F | Ht 64.0 in | Wt 140.8 lb

## 2024-06-30 DIAGNOSIS — D649 Anemia, unspecified: Secondary | ICD-10-CM | POA: Diagnosis not present

## 2024-06-30 DIAGNOSIS — Z8249 Family history of ischemic heart disease and other diseases of the circulatory system: Secondary | ICD-10-CM | POA: Diagnosis not present

## 2024-06-30 DIAGNOSIS — E119 Type 2 diabetes mellitus without complications: Secondary | ICD-10-CM | POA: Diagnosis not present

## 2024-06-30 DIAGNOSIS — I10 Essential (primary) hypertension: Secondary | ICD-10-CM | POA: Diagnosis not present

## 2024-06-30 DIAGNOSIS — Z8639 Personal history of other endocrine, nutritional and metabolic disease: Secondary | ICD-10-CM

## 2024-06-30 DIAGNOSIS — K219 Gastro-esophageal reflux disease without esophagitis: Secondary | ICD-10-CM | POA: Diagnosis not present

## 2024-06-30 DIAGNOSIS — Z1211 Encounter for screening for malignant neoplasm of colon: Secondary | ICD-10-CM

## 2024-06-30 NOTE — Patient Instructions (Addendum)
  I have ordered for CT calcium  test.  You may schedule on your own by calling [phone] ( [facility] [address] in [place] ).   An estimate of cost is $150-200 out-of-pocket as not covered by insurance.    Below an article from [facility] regarding the test.   https://[email or web address].org/imaging/exams-and-procedures/screenings/cardiac-ct#:~:text=A%20cardiac%20CT%20calcium%20score,arteries%20can%20cause%20heart%20attacks.   Exams We Offer: Cardiac CT Calcium  Score  Knowing your score could save your life. A cardiac CT calcium  score, also known as a coronary calcium  scan, is a quick, convenient and noninvasive way of evaluating the amount of calcified (hard) plaque in your heart vessels. The level of calcium  equates to the extent of plaque build-up in your arteries. Plaque in the arteries can cause heart attacks.  The radiologist reads the images and sends your doctor a report with a calcium  score. Patients with higher scores have a greater risk for a heart attack, heart disease or stroke. Knowing your score can help your doctor decide on blood pressure and cholesterol goals that will minimize your risk as much as possible.  The [employer] of Cardiology found that Coronary artery calcification (CAC) is an excellent cardiovascular disease risk marker and can help guide the decision to use cholesterol reducing medications such as statins. A negative calcium  score may reduce the need for statins in otherwise eligible patients.  The exam takes less than 10 minutes, is painless and does not require any IV or oral contrast.  Who should get a Cardiac CT Calcium  Score: Middle age adults at intermediate risk of heart disease Family history of heart disease Borderline high cholesterol, high blood pressure or diabetes Overweight or physical inactivity Uncertain about taking daily preventive medical therapy

## 2024-06-30 NOTE — Progress Notes (Signed)
 Assessment & Plan:  Family history of ASCVD Assessment & Plan: Fortunately no cardiac symptoms as this time. Discussed strong family history. Baseline EKG obtained ahead of cataract surgery and due to family history. EKG is SR without acute changes or new T wave inversions when compared to prior 03/23/22.Pending CT calcium  score, lipid panel.   Orders: -     Lipid panel; Future -     EKG 12-Lead -     CT CARDIAC SCORING (SELF PAY ONLY); Future  Type 2 diabetes mellitus without complication, without long-term current use of insulin (HCC) Assessment & Plan: Excellent control.  Continue Ozempic  2 mg weekly, 500 mg metformin  daily.  Weight is overall stable.Will monitor while on Ozempic .    Orders: -     Microalbumin / creatinine urine ratio; Future -     Comprehensive metabolic panel with GFR; Future -     Hemoglobin A1c; Future  Anemia, unspecified type -     CBC with Differential/Platelet; Future  History of vitamin D  deficiency -     VITAMIN D  25 Hydroxy (Vit-D Deficiency, Fractures); Future  Essential hypertension -     Comprehensive metabolic panel with GFR; Future  Gastroesophageal reflux disease, unspecified whether esophagitis present  Screening for colon cancer -     Ambulatory referral to Gastroenterology     Return precautions given.   Risks, benefits, and alternatives of the medications and treatment plan prescribed today were discussed, and patient expressed understanding.   Education regarding symptom management and diagnosis given to patient on AVS either electronically or printed.  Return in about 3 months (around 09/29/2024) for Fasting labs in 2-3 weeks.  Rollene Northern, FNP  Subjective:    Patient ID: Tiffany Gregory, female    DOB: 1950/11/26, 73 y.o.   MRN: [id number]  CC: Tiffany Gregory is a 73 y.o. female who presents today for follow up.   HPI: HPI Discussed the use of AI scribe software for clinical note transcription with the patient, who  gave verbal consent to proceed.  History of Present Illness   Tiffany Gregory is a 73 year old female who presents for a discussion of her family history of heart disease and its implications on her health.  She is scheduled for cataract surgery on the 29th of this month and is concerned due to a family history of heart disease.  Her great aunt on her father's side died of a heart attack in her seventies.  There is a significant family history of heart disease. Her grandmother on her father's side had rheumatic fever while nursing her third son, who later died of a heart condition in his thirties. Her father underwent open heart surgery in his seventies and passed away at 59. Her mother had a heart attack in 1998, possibly earlier, and had a stent placed later in life. Her older brother, Tiffany Gregory, had open heart surgery in his seventies.  Denies current chest pain or shortness of breath.        Compliant with Ozempic  2 mg weekly, 500 mg metformin  daily   Allergies: Lemon flavoring agent (non-screening), Amlodipine , Keflex  [cephalexin ], Mango flavor [flavoring agent], [name], Neosporin [bacitracin-polymyxin b], Sulfa antibiotics, and Valsartan  Current Outpatient Medications on File Prior to Visit  Medication Sig Dispense Refill   acetaminophen (TYLENOL) 325 MG tablet Take 650 mg by mouth every 6 (six) hours as needed.     aspirin EC 81 MG tablet Take 81 mg by mouth daily. Swallow whole.  atorvastatin  (LIPITOR) 40 MG tablet TAKE 1 TABLET(40 MG) BY MOUTH DAILY 90 tablet 3   Blood Glucose Monitoring Suppl (ONE TOUCH ULTRA 2) w/Device KIT 1 Device by Does not apply route daily. 1 kit 0   famotidine  (PEPCID ) 20 MG tablet TAKE 1 TABLET(20 MG) BY MOUTH AT BEDTIME 90 tablet 3   fluticasone  (FLONASE ) 50 MCG/ACT nasal spray SHAKE LIQUID AND USE 2 SPRAYS IN EACH NOSTRIL DAILY (Patient taking differently: daily as needed.) 48 g 3   glucose blood test strip 1 each by Other route as needed  for other. Use as instructed. One touch ultra blue in vitro strips 100 each 3   metFORMIN  (GLUCOPHAGE -XR) 500 MG 24 hr tablet TAKE 1 TABLET(500 MG) BY MOUTH DAILY WITH BREAKFAST 90 tablet 1   omeprazole  (PRILOSEC) 20 MG capsule TAKE 2 CAPSULES(40 MG) BY MOUTH DAILY 180 capsule 1   OneTouch Delica Lancets 33G MISC by Does not apply route.     OZEMPIC , 2 MG/DOSE, 8 MG/3ML SOPN INJECT 2MG  INTO SKIN ONCE WEEKLY 3 mL 2   polyethylene glycol (MIRALAX / GLYCOLAX) 17 g packet Take 17 g by mouth daily.     ramipril  (ALTACE ) 10 MG capsule TAKE 2 CAPSULES(20 MG) BY MOUTH DAILY 180 capsule 3   No current facility-administered medications on file prior to visit.    Review of Systems  Constitutional:  Negative for chills and fever.  Respiratory:  Negative for cough.   Cardiovascular:  Negative for chest pain and palpitations.  Gastrointestinal:  Negative for nausea and vomiting.      Objective:    BP 136/62   Pulse 80   Temp 97.8 F (36.6 C) (Oral)   Ht 5' 4 (1.626 m)   Wt 140 lb 12.8 oz (63.9 kg)   SpO2 97%   BMI 24.17 kg/m  BP Readings from Last 3 Encounters:  07/06/24 139/77  06/30/24 136/62  04/03/24 124/82   Wt Readings from Last 3 Encounters:  07/06/24 139 lb 9.6 oz (63.3 kg)  06/30/24 140 lb 12.8 oz (63.9 kg)  06/19/24 139 lb (63 kg)   Lab Results  Component Value Date   HGBA1C 5.8 (A) 04/03/2024    Physical Exam Vitals reviewed.  Constitutional:      Appearance: She is well-developed.  Eyes:     Conjunctiva/sclera: Conjunctivae normal.  Neck:     Vascular: No carotid bruit.  Cardiovascular:     Rate and Rhythm: Normal rate and regular rhythm.     Pulses: Normal pulses.     Heart sounds: Normal heart sounds.  Pulmonary:     Effort: Pulmonary effort is normal.     Breath sounds: Normal breath sounds. No wheezing, rhonchi or rales.  Musculoskeletal:     Right lower leg: No edema.     Left lower leg: No edema.  Skin:    General: Skin is warm and dry.   Neurological:     Mental Status: She is alert.  Psychiatric:        Speech: Speech normal.        Behavior: Behavior normal.        Thought Content: Thought content normal.

## 2024-07-02 NOTE — Discharge Instructions (Signed)

## 2024-07-03 ENCOUNTER — Ambulatory Visit
Admission: RE | Admit: 2024-07-03 | Discharge: 2024-07-03 | Disposition: A | Discharge status: Home / Self Care | Payer: Self-pay | Source: Ambulatory Visit | Attending: Family | Admitting: Family

## 2024-07-03 DIAGNOSIS — Z8249 Family history of ischemic heart disease and other diseases of the circulatory system: Secondary | ICD-10-CM | POA: Insufficient documentation

## 2024-07-06 ENCOUNTER — Encounter
Admission: RE | Disposition: A | Discharge status: Home / Self Care | Payer: Self-pay | Source: Home / Self Care | Attending: Ophthalmology

## 2024-07-06 ENCOUNTER — Ambulatory Visit: Payer: Self-pay | Admitting: Anesthesiology

## 2024-07-06 ENCOUNTER — Ambulatory Visit
Admission: RE | Admit: 2024-07-06 | Discharge: 2024-07-06 | Disposition: A | Discharge status: Home / Self Care | Attending: Ophthalmology | Admitting: Ophthalmology

## 2024-07-06 ENCOUNTER — Other Ambulatory Visit: Payer: Self-pay

## 2024-07-06 ENCOUNTER — Encounter: Payer: Self-pay | Admitting: Ophthalmology

## 2024-07-06 DIAGNOSIS — E1136 Type 2 diabetes mellitus with diabetic cataract: Secondary | ICD-10-CM | POA: Diagnosis not present

## 2024-07-06 DIAGNOSIS — H2512 Age-related nuclear cataract, left eye: Secondary | ICD-10-CM | POA: Diagnosis not present

## 2024-07-06 DIAGNOSIS — I10 Essential (primary) hypertension: Secondary | ICD-10-CM | POA: Diagnosis not present

## 2024-07-06 HISTORY — DX: Bell's palsy: G51.0

## 2024-07-06 HISTORY — DX: Other complications of anesthesia, initial encounter: T88.59XA

## 2024-07-06 HISTORY — DX: Fatty (change of) liver, not elsewhere classified: K76.0

## 2024-07-06 HISTORY — DX: Gastro-esophageal reflux disease without esophagitis: K21.9

## 2024-07-06 HISTORY — DX: Traumatic subdural hemorrhage with loss of consciousness status unknown, initial encounter: S06.5XAA

## 2024-07-06 HISTORY — DX: Nausea with vomiting, unspecified: R11.2

## 2024-07-06 HISTORY — DX: Other specified postprocedural states: Z98.890

## 2024-07-06 HISTORY — DX: Unspecified ptosis of left eyelid: H02.402

## 2024-07-06 HISTORY — PX: CATARACT EXTRACTION W/PHACO: SHX586

## 2024-07-06 LAB — GLUCOSE, CAPILLARY: Glucose-Capillary: 82 mg/dL (ref 70–99)

## 2024-07-06 SURGERY — PHACOEMULSIFICATION, CATARACT, WITH IOL INSERTION
Anesthesia: Monitor Anesthesia Care | Laterality: Left

## 2024-07-06 MED ORDER — MIDAZOLAM HCL 2 MG/2ML IJ SOLN
INTRAMUSCULAR | Status: AC
  Filled 2024-07-06: qty 2

## 2024-07-06 MED ORDER — ARMC OPHTHALMIC DILATING DROPS
1.0000 | OPHTHALMIC | Status: DC | PRN
  Administered 2024-07-06 (×2): 1 via OPHTHALMIC

## 2024-07-06 MED ORDER — FENTANYL CITRATE (PF) 100 MCG/2ML IJ SOLN
INTRAMUSCULAR | Status: DC | PRN
  Administered 2024-07-06 (×2): 50 ug via INTRAVENOUS

## 2024-07-06 MED ORDER — ARMC OPHTHALMIC DILATING DROPS
OPHTHALMIC | Status: AC
  Filled 2024-07-06: qty 0.5

## 2024-07-06 MED ORDER — SIGHTPATH DOSE#1 BSS IO SOLN
INTRAOCULAR | Status: DC | PRN
  Administered 2024-07-06: 79 mL via OPHTHALMIC

## 2024-07-06 MED ORDER — SIGHTPATH DOSE#1 BSS IO SOLN
INTRAOCULAR | Status: DC | PRN
  Administered 2024-07-06: 15 mL via INTRAOCULAR

## 2024-07-06 MED ORDER — LIDOCAINE HCL (PF) 2 % IJ SOLN
INTRAOCULAR | Status: DC | PRN
  Administered 2024-07-06: 1 mL via INTRAOCULAR

## 2024-07-06 MED ORDER — MIDAZOLAM HCL 2 MG/2ML IJ SOLN
INTRAMUSCULAR | Status: DC | PRN
  Administered 2024-07-06: 2 mg via INTRAVENOUS

## 2024-07-06 MED ORDER — ONDANSETRON HCL 4 MG/2ML IJ SOLN
INTRAMUSCULAR | Status: AC
  Filled 2024-07-06: qty 2

## 2024-07-06 MED ORDER — TETRACAINE HCL 0.5 % OP SOLN
1.0000 [drp] | OPHTHALMIC | Status: DC | PRN
  Administered 2024-07-06 (×3): 1 [drp] via OPHTHALMIC

## 2024-07-06 MED ORDER — TETRACAINE HCL 0.5 % OP SOLN
OPHTHALMIC | Status: AC
  Filled 2024-07-06: qty 4

## 2024-07-06 MED ORDER — ONDANSETRON HCL 4 MG/2ML IJ SOLN: 4.0000 mg | Freq: Once | INTRAMUSCULAR | Status: DC

## 2024-07-06 MED ORDER — SIGHTPATH DOSE#1 NA HYALUR & NA CHOND-NA HYALUR IO KIT
PACK | INTRAOCULAR | Status: DC | PRN
  Administered 2024-07-06: 1 via OPHTHALMIC

## 2024-07-06 MED ORDER — MOXIFLOXACIN HCL 0.5 % OP SOLN
OPHTHALMIC | Status: DC | PRN
  Administered 2024-07-06: .2 mL via OPHTHALMIC

## 2024-07-06 MED ORDER — LACTATED RINGERS IV SOLN: INTRAVENOUS | Status: DC

## 2024-07-06 MED ORDER — FENTANYL CITRATE (PF) 100 MCG/2ML IJ SOLN
INTRAMUSCULAR | Status: AC
  Filled 2024-07-06: qty 2

## 2024-07-06 SURGICAL SUPPLY — 9 items
DISSECTOR HYDRO NUCLEUS 50X22 (MISCELLANEOUS) ×1 IMPLANT
FEE CATARACT SUITE SIGHTPATH (MISCELLANEOUS) ×1 IMPLANT
GLOVE PI ULTRA LF STRL 7.5 (GLOVE) ×1 IMPLANT
GLOVE SURG SYN 6.5 PF PI BL (GLOVE) ×1 IMPLANT
GLOVE SURG SYN 8.5 PF PI BL (GLOVE) ×1 IMPLANT
LENS IOL PAN PRO TRC 3 24.5 IMPLANT
NDL FILTER BLUNT 18X1 1/2 (NEEDLE) ×1 IMPLANT
NEEDLE FILTER BLUNT 18X1 1/2 (NEEDLE) ×1 IMPLANT
SYR 3ML LL SCALE MARK (SYRINGE) ×1 IMPLANT

## 2024-07-06 NOTE — Anesthesia Postprocedure Evaluation (Signed)
 Anesthesia Post Note  Patient: Tiffany Gregory  Procedure(s) Performed: PHACOEMULSIFICATION, CATARACT, WITH IOL INSERTION 6.10, 00:41.3 (Left)  Patient location during evaluation: PACU Anesthesia Type: MAC Level of consciousness: awake and alert Pain management: pain level controlled Vital Signs Assessment: post-procedure vital signs reviewed and stable Respiratory status: spontaneous breathing, nonlabored ventilation, respiratory function stable and patient connected to nasal cannula oxygen Cardiovascular status: stable and blood pressure returned to baseline Postop Assessment: no apparent nausea or vomiting Anesthetic complications: no   No notable events documented.   Last Vitals:  Vitals:   07/06/24 0859 07/06/24 0904  BP: 133/71 139/77  Pulse: 80 82  Resp: 20 13  Temp: 36.4 C 36.4 C  SpO2: 99% 98%    Last Pain:  Vitals:   07/06/24 0904  TempSrc:   PainSc: 0-No pain                 Donny JAYSON Mu

## 2024-07-06 NOTE — Op Note (Signed)
 OPERATIVE NOTE  Tiffany Gregory [id number] 07/06/2024   PREOPERATIVE DIAGNOSIS:  Nuclear sclerotic cataract left eye.  H25.12   POSTOPERATIVE DIAGNOSIS:    Nuclear sclerotic cataract left eye.     PROCEDURE:  Phacoemusification with posterior chamber intraocular lens placement of the left eye   LENS:   Implant Name Type Inv. Item Serial No. Manufacturer Lot No. LRB No. Used Action  CLAREON PAN OPTIX PRO TORIC   [id number]   Left 1 Implanted      Procedure(s): PHACOEMULSIFICATION, CATARACT, WITH IOL INSERTION 6.10, 00:41.3 (Left)  SURGEON:  Adine Novak, MD, MPH   ANESTHESIA:  Topical with tetracaine drops augmented with 1% preservative-free intracameral lidocaine.  ESTIMATED BLOOD LOSS: <1 mL   COMPLICATIONS:  None.   DESCRIPTION OF PROCEDURE:  The patient was identified in the holding room and transported to the operating room and placed in the supine position under the operating microscope.  The left eye was identified as the operative eye and it was prepped and draped in the usual sterile ophthalmic fashion.  The verion system was registered without difficulty.   A 1.0 millimeter clear-corneal paracentesis was made at the 5:00 position. 0.5 ml of preservative-free 1% lidocaine with epinephrine was injected into the anterior chamber.  The anterior chamber was filled with viscoelastic.  A 2.4 millimeter keratome was used to make a near-clear corneal incision at the 2:00 position.  A curvilinear capsulorrhexis was made with a cystotome and capsulorrhexis forceps.  Balanced salt solution was used to hydrodissect and hydrodelineate the nucleus.   Phacoemulsification was then used in stop and chop fashion to remove the lens nucleus and epinucleus.  The remaining cortex was then removed using the irrigation and aspiration handpiece. Viscoelastic was then placed into the capsular bag to distend it for lens placement.  A lens was then injected into the capsular bag.  The remaining  viscoelastic was aspirated.  The lens was rotated with guidance from the verion system.   Wounds were hydrated with balanced salt solution.  The anterior chamber was inflated to a physiologic pressure with balanced salt solution.  Intracameral vigamox 0.1 mL undiltued was injected into the eye and a drop placed onto the ocular surface.  No wound leaks were noted.  The patient was taken to the recovery room in stable condition without complications of anesthesia or surgery  Adine Novak 07/06/2024, 8:57 AM

## 2024-07-06 NOTE — H&P (Signed)
 [facility]   Primary Care Physician:  Dineen Rollene MATSU, FNP Ophthalmologist: Dr. Adine Novak  Pre-Procedure History & Physical: HPI:  Tiffany Gregory is a 73 y.o. female here for cataract surgery.   Past Medical History:  Diagnosis Date   Allergy    Asthma    as a child   Bell's palsy    eye closes by itself   Complication of anesthesia    PONV   Diabetes mellitus without complication (HCC)    Elevated liver enzymes    Fatty liver    GERD (gastroesophageal reflux disease)    Hepatic steatosis    History of hepatitis B 1971   She had IV drug use experimentation and was hosptialized and reports treated.    Hyperlipidemia    Hypertension    PONV (postoperative nausea and vomiting)    PONV (postoperative nausea and vomiting)    Ptosis, left eyelid    Subdural hematoma (HCC)     Past Surgical History:  Procedure Laterality Date   ABDOMINAL HYSTERECTOMY     BREAST BIOPSY Left    [place]opsy done in [place]    CRANIOTOMY  10/09/2019   slipped on ice, hematoma   CRANIOTOMY N/A    repositioning of drain -2 days post op- 2021   NASAL FRACTURE SURGERY  1975   NASAL SINUS SURGERY  2010   TUBAL LIGATION Bilateral     Prior to Admission medications   Medication Sig Start Date End Date Taking? Authorizing Provider  acetaminophen (TYLENOL) 325 MG tablet Take 650 mg by mouth every 6 (six) hours as needed.   Yes [provider]  aspirin EC 81 MG tablet Take 81 mg by mouth daily. Swallow whole.   Yes [provider]  atorvastatin  (LIPITOR) 40 MG tablet TAKE 1 TABLET(40 MG) BY MOUTH DAILY 12/19/23  Yes Arnett, Rollene MATSU, FNP  Blood Glucose Monitoring Suppl (ONE TOUCH ULTRA 2) w/Device KIT 1 Device by Does not apply route daily. 07/12/22  Yes Arnett, Rollene MATSU, FNP  famotidine  (PEPCID ) 20 MG tablet TAKE 1 TABLET(20 MG) BY MOUTH AT BEDTIME 06/19/24  Yes Arnett, Rollene MATSU, FNP  glucose blood test strip 1 each by Other route as needed for other. Use as instructed.  One touch ultra blue in vitro strips 07/14/21  Yes [name], Rosaline RAMAN, FNP  metFORMIN  (GLUCOPHAGE -XR) 500 MG 24 hr tablet TAKE 1 TABLET(500 MG) BY MOUTH DAILY WITH BREAKFAST 05/03/24  Yes Dineen Rollene MATSU, FNP  omeprazole  (PRILOSEC) 20 MG capsule TAKE 2 CAPSULES(40 MG) BY MOUTH DAILY 03/23/24  Yes Arnett, Rollene MATSU, FNP  OneTouch Delica Lancets 33G MISC by Does not apply route.   Yes [provider]  OZEMPIC , 2 MG/DOSE, 8 MG/3ML SOPN INJECT 2MG  INTO SKIN ONCE WEEKLY 06/01/24  Yes Dineen Rollene MATSU, FNP  ramipril  (ALTACE ) 10 MG capsule TAKE 2 CAPSULES(20 MG) BY MOUTH DAILY 06/19/24  Yes Dineen Rollene MATSU, FNP  fluticasone  (FLONASE ) 50 MCG/ACT nasal spray SHAKE LIQUID AND USE 2 SPRAYS IN EACH NOSTRIL DAILY Patient taking differently: daily as needed. 06/06/22   Dineen Rollene MATSU, FNP  polyethylene glycol (MIRALAX / GLYCOLAX) 17 g packet Take 17 g by mouth daily.    [provider]    Allergies as of 06/19/2024 - Review Complete 06/19/2024  Allergen Reaction Noted   Lemon flavoring agent (non-screening) Anaphylaxis 01/15/2021   Amlodipine  Other (See Comments) 03/26/2023   Keflex  [cephalexin ] Itching 09/25/2021   Mango flavor [flavoring agent] Hives 03/30/2024   [name] Other (  See Comments) 11/30/2020   Neosporin [bacitracin-polymyxin b] Other (See Comments) 06/17/2023   Sulfa antibiotics Other (See Comments) 06/14/2020   Valsartan  Hives 02/09/2022    Family History  Problem Relation Age of Onset   Diabetes Mother    Heart attack Mother 13   Diabetes Father    Heart disease Father 48       dief at 49   Hypertension Father    Diabetes Sister    COPD Sister    Heart disease Sister    Diabetes Brother    Heart attack Brother    Heart disease Brother 41   Dementia Brother        died at 39   Heart attack Maternal Grandmother    Cancer Maternal Grandfather    Heart attack Paternal Grandmother    Rheumatic fever Paternal Grandmother    Cancer Paternal  Grandfather    Heart attack Paternal Aunt 74   Colon cancer Neg Hx     Social History   Socioeconomic History   Marital status: Widowed    Spouse name: Breslin Burklow   Number of children: 2   Years of education: 16   Highest education level: Bachelor's degree (e.g., BA, AB, BS)  Occupational History   Occupation: self employed  Tobacco Use   Smoking status: Never    Passive exposure: Past   Smokeless tobacco: Never  Vaping Use   Vaping status: Never Used  Substance and Sexual Activity   Alcohol use: Yes    Comment: ocaisionally   Drug use: Yes    Types: Marijuana    Comment: as younger adult   Sexual activity: Not Currently    Partners: Male  Other Topics Concern   Not on file  Social History Narrative   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    Social Drivers of Corporate investment banker Strain: Low Risk  (06/12/2024)   Overall Financial Resource Strain (CARDIA)    Difficulty of Paying Living Expenses: Not hard at all  Food Insecurity: No Food Insecurity (06/12/2024)   Hunger Vital Sign    Worried About Running Out of Food in the Last Year: Never true    Ran Out of Food in the Last Year: Never true  Transportation Needs: No Transportation Needs (06/12/2024)   PRAPARE - Administrator, Civil Service (Medical): No    Lack of Transportation (Non-Medical): No  Physical Activity: Insufficiently Active (06/12/2024)   Exercise Vital Sign    Days of Exercise per Week: 4 days    Minutes of Exercise per Session: 30 min  Stress: No Stress Concern Present (06/12/2024)   [employer] of Occupational Health - Occupational Stress Questionnaire    Feeling of Stress: Not at all  Social Connections: Moderately Isolated (06/12/2024)   Social Connection and Isolation Panel    Frequency of Communication with Friends and Family: More than three times a week    Frequency of Social Gatherings  with Friends and Family: Once a week    Attends Religious Services: Never    Database administrator or Organizations: Yes    Attends Engineer, structural: More than 4 times per year    Marital Status: Widowed  Intimate Partner Violence: Not At Risk (06/19/2024)   Humiliation, Afraid, Rape, and Kick questionnaire    Fear of Current or Ex-Partner: No    Emotionally  Abused: No    Physically Abused: No    Sexually Abused: No    Review of Systems: See HPI, otherwise negative ROS  Physical Exam: BP (!) 160/67   Pulse 78   Temp 97.7 F (36.5 C) (Temporal)   Resp 17   Ht 5' 4 (1.626 m)   Wt 63.3 kg   SpO2 98%   BMI 23.96 kg/m  General:   Alert, cooperative. Head:  Normocephalic and atraumatic. Respiratory:  Normal work of breathing. Cardiovascular:  NAD  Impression/Plan: Tiffany Gregory is here for cataract surgery.  Risks, benefits, limitations, and alternatives regarding cataract surgery have been reviewed with the patient.  Questions have been answered.  All parties agreeable.   Adine Novak, MD  07/06/2024, 8:24 AM

## 2024-07-06 NOTE — Transfer of Care (Addendum)
 Immediate Anesthesia Transfer of Care Note  Patient: Tiffany Gregory  Procedure(s) Performed: PHACOEMULSIFICATION, CATARACT, WITH IOL INSERTION 6.10, 00:41.3 (Left)  Patient Location: PACU  Anesthesia Type: MAC  Level of Consciousness: awake, alert  and patient cooperative  Airway and Oxygen Therapy: Patient Spontanous Breathing and Patient connected to supplemental oxygen  Post-op Assessment: Post-op Vital signs reviewed, Patient's Cardiovascular Status Stable, Respiratory Function Stable, Patent Airway and No signs of Nausea or vomiting  Post-op Vital Signs: Reviewed and stable  Complications: No notable events documented.

## 2024-07-08 NOTE — Assessment & Plan Note (Signed)
 Excellent control.  Continue Ozempic  2 mg weekly, 500 mg metformin  daily.  Weight is overall stable.Will monitor while on Ozempic .

## 2024-07-08 NOTE — Assessment & Plan Note (Signed)
 Fortunately no cardiac symptoms as this time. Discussed strong family history. Baseline EKG obtained ahead of cataract surgery and due to family history. EKG is SR without acute changes or new T wave inversions when compared to prior 03/23/22.Pending CT calcium  score, lipid panel.

## 2024-07-09 ENCOUNTER — Ambulatory Visit: Admitting: Family

## 2024-07-13 ENCOUNTER — Ambulatory Visit: Payer: Self-pay | Admitting: Family

## 2024-07-13 DIAGNOSIS — R9389 Abnormal findings on diagnostic imaging of other specified body structures: Secondary | ICD-10-CM

## 2024-07-13 DIAGNOSIS — Z1211 Encounter for screening for malignant neoplasm of colon: Secondary | ICD-10-CM

## 2024-07-13 NOTE — Anesthesia Preprocedure Evaluation (Addendum)
 Anesthesia Evaluation  Patient identified by MRN, date of birth, ID band Patient awake    Reviewed: Allergy & Precautions, H&P , NPO status , Patient's Chart, lab work & pertinent test results  History of Anesthesia Complications (+) PONV and history of anesthetic complications  Airway Mallampati: III  TM Distance: <3 FB Neck ROM: Full    Dental no notable dental hx.    Pulmonary neg pulmonary ROS, asthma    Pulmonary exam normal breath sounds clear to auscultation       Cardiovascular hypertension, Normal cardiovascular exam Rhythm:Regular Rate:Normal     Neuro/Psych  Neuromuscular disease negative neurological ROS  negative psych ROS   GI/Hepatic negative GI ROS, Neg liver ROS,GERD  ,,  Endo/Other  diabetes    Renal/GU negative Renal ROS  negative genitourinary   Musculoskeletal negative musculoskeletal ROS (+)    Abdominal   Peds negative pediatric ROS (+)  Hematology negative hematology ROS (+)   Anesthesia Other Findings Previous cataract 07-06-24 Dr. Ola  Requests preop zofran  due to mild nausea preop Asthma Diabetes mellitus without complication  Allergy             History of hepatitis B Elevated liver enzymes  Hepatic steatosis Hypertension             Hyperlipidemia GERD (gastroesophageal reflux disease)Bell's palsy Ptosis, left eyelid         Subdural hematoma (HCC)   Reproductive/Obstetrics negative OB ROS                              Anesthesia Physical Anesthesia Plan  ASA: 3  Anesthesia Plan: MAC   Post-op Pain Management:    Induction: Intravenous  PONV Risk Score and Plan:   Airway Management Planned: Natural Airway and Nasal Cannula  Additional Equipment:   Intra-op Plan:   Post-operative Plan:   Informed Consent: I have reviewed the patients History and Physical, chart, labs and discussed the procedure including the risks, benefits and  alternatives for the proposed anesthesia with the patient or authorized representative who has indicated his/her understanding and acceptance.     Dental Advisory Given  Plan Discussed with: Anesthesiologist, CRNA and Surgeon  Anesthesia Plan Comments: (Patient consented for risks of anesthesia including but not limited to:  - adverse reactions to medications - damage to eyes, teeth, lips or other oral mucosa - nerve damage due to positioning  - sore throat or hoarseness - Damage to heart, brain, nerves, lungs, other parts of body or loss of life  Patient voiced understanding and assent.)         Anesthesia Quick Evaluation

## 2024-07-16 NOTE — Discharge Instructions (Signed)

## 2024-07-17 ENCOUNTER — Other Ambulatory Visit (INDEPENDENT_AMBULATORY_CARE_PROVIDER_SITE_OTHER)

## 2024-07-17 DIAGNOSIS — Z8639 Personal history of other endocrine, nutritional and metabolic disease: Secondary | ICD-10-CM

## 2024-07-17 DIAGNOSIS — D649 Anemia, unspecified: Secondary | ICD-10-CM

## 2024-07-17 DIAGNOSIS — E119 Type 2 diabetes mellitus without complications: Secondary | ICD-10-CM

## 2024-07-17 DIAGNOSIS — Z8249 Family history of ischemic heart disease and other diseases of the circulatory system: Secondary | ICD-10-CM | POA: Diagnosis not present

## 2024-07-17 DIAGNOSIS — I10 Essential (primary) hypertension: Secondary | ICD-10-CM | POA: Diagnosis not present

## 2024-07-17 DIAGNOSIS — Z23 Encounter for immunization: Secondary | ICD-10-CM

## 2024-07-17 LAB — COMPREHENSIVE METABOLIC PANEL WITH GFR
ALT: 9 U/L (ref 0–35)
AST: 12 U/L (ref 0–37)
Albumin: 4.5 g/dL (ref 3.5–5.2)
Alkaline Phosphatase: 69 U/L (ref 39–117)
BUN: 10 mg/dL (ref 6–23)
CO2: 30 meq/L (ref 19–32)
Calcium: 9.3 mg/dL (ref 8.4–10.5)
Chloride: 99 meq/L (ref 96–112)
Creatinine, Ser: 0.62 mg/dL (ref 0.40–1.20)
GFR: 88.9 mL/min (ref >60.00)
Glucose, Bld: 92 mg/dL (ref 70–99)
Potassium: 4.1 meq/L (ref 3.5–5.1)
Sodium: 138 meq/L (ref 135–145)
Total Bilirubin: 0.4 mg/dL (ref 0.2–1.2)
Total Protein: 7.4 g/dL (ref 6.0–8.3)

## 2024-07-17 LAB — LIPID PANEL
Cholesterol: 101 mg/dL (ref 0–200)
HDL: 32.2 mg/dL — ABNORMAL LOW (ref >39.00)
LDL Cholesterol: 40 mg/dL (ref 0–99)
NonHDL: 68.87
Total CHOL/HDL Ratio: 3
Triglycerides: 146 mg/dL (ref 0.0–149.0)
VLDL: 29.2 mg/dL (ref 0.0–40.0)

## 2024-07-17 LAB — CBC WITH DIFFERENTIAL/PLATELET
Basophils Absolute: 0.1 K/uL (ref 0.0–0.1)
Basophils Relative: 0.6 % (ref 0.0–3.0)
Eosinophils Absolute: 0.2 K/uL (ref 0.0–0.7)
Eosinophils Relative: 2.2 % (ref 0.0–5.0)
HCT: 37.1 % (ref 36.0–46.0)
Hemoglobin: 11.9 g/dL — ABNORMAL LOW (ref 12.0–15.0)
Lymphocytes Relative: 16.9 % (ref 12.0–46.0)
Lymphs Abs: 1.5 K/uL (ref 0.7–4.0)
MCHC: 32.1 g/dL (ref 30.0–36.0)
MCV: 82.3 fl (ref 78.0–100.0)
Monocytes Absolute: 0.5 K/uL (ref 0.1–1.0)
Monocytes Relative: 5.3 % (ref 3.0–12.0)
Neutro Abs: 6.8 K/uL (ref 1.4–7.7)
Neutrophils Relative %: 75 % (ref 43.0–77.0)
Platelets: 323 K/uL (ref 150.0–400.0)
RBC: 4.51 Mil/uL (ref 3.87–5.11)
RDW: 14.8 % (ref 11.5–15.5)
WBC: 9 K/uL (ref 4.0–10.5)

## 2024-07-17 LAB — VITAMIN D 25 HYDROXY (VIT D DEFICIENCY, FRACTURES): VITD: 25.34 ng/mL — ABNORMAL LOW (ref 30.00–100.00)

## 2024-07-17 LAB — MICROALBUMIN / CREATININE URINE RATIO
Creatinine,U: 166 mg/dL
Microalb Creat Ratio: 16.5 mg/g (ref 0.0–30.0)
Microalb, Ur: 2.7 mg/dL — ABNORMAL HIGH (ref 0.0–1.9)

## 2024-07-17 LAB — HEMOGLOBIN A1C: Hgb A1c MFr Bld: 6 % (ref 4.6–6.5)

## 2024-07-17 NOTE — Addendum Note (Signed)
 Addended by: BRIEN SHARENE RAMAN on: 07/17/2024 08:46 AM   Modules accepted: Orders

## 2024-07-20 ENCOUNTER — Encounter
Admission: RE | Disposition: A | Discharge status: Home / Self Care | Payer: Self-pay | Source: Home / Self Care | Attending: Ophthalmology

## 2024-07-20 ENCOUNTER — Other Ambulatory Visit: Payer: Self-pay

## 2024-07-20 ENCOUNTER — Ambulatory Visit
Admission: RE | Admit: 2024-07-20 | Discharge: 2024-07-20 | Disposition: A | Discharge status: Home / Self Care | Attending: Ophthalmology | Admitting: Ophthalmology

## 2024-07-20 ENCOUNTER — Encounter: Payer: Self-pay | Admitting: Ophthalmology

## 2024-07-20 ENCOUNTER — Ambulatory Visit: Payer: Self-pay | Admitting: Anesthesiology

## 2024-07-20 DIAGNOSIS — Z794 Long term (current) use of insulin: Secondary | ICD-10-CM | POA: Diagnosis not present

## 2024-07-20 DIAGNOSIS — K219 Gastro-esophageal reflux disease without esophagitis: Secondary | ICD-10-CM | POA: Diagnosis not present

## 2024-07-20 DIAGNOSIS — E785 Hyperlipidemia, unspecified: Secondary | ICD-10-CM | POA: Diagnosis not present

## 2024-07-20 DIAGNOSIS — J45909 Unspecified asthma, uncomplicated: Secondary | ICD-10-CM | POA: Insufficient documentation

## 2024-07-20 DIAGNOSIS — I10 Essential (primary) hypertension: Secondary | ICD-10-CM | POA: Diagnosis not present

## 2024-07-20 DIAGNOSIS — E1136 Type 2 diabetes mellitus with diabetic cataract: Secondary | ICD-10-CM | POA: Insufficient documentation

## 2024-07-20 DIAGNOSIS — H2511 Age-related nuclear cataract, right eye: Secondary | ICD-10-CM | POA: Diagnosis not present

## 2024-07-20 DIAGNOSIS — Z7984 Long term (current) use of oral hypoglycemic drugs: Secondary | ICD-10-CM | POA: Diagnosis not present

## 2024-07-20 HISTORY — PX: CATARACT EXTRACTION W/PHACO: SHX586

## 2024-07-20 LAB — GLUCOSE, CAPILLARY: Glucose-Capillary: 97 mg/dL (ref 70–99)

## 2024-07-20 SURGERY — PHACOEMULSIFICATION, CATARACT, WITH IOL INSERTION
Anesthesia: Monitor Anesthesia Care | Site: Eye | Laterality: Right

## 2024-07-20 MED ORDER — FENTANYL CITRATE (PF) 100 MCG/2ML IJ SOLN
INTRAMUSCULAR | Status: AC
  Filled 2024-07-20: qty 2

## 2024-07-20 MED ORDER — FENTANYL CITRATE (PF) 100 MCG/2ML IJ SOLN
INTRAMUSCULAR | Status: DC | PRN
  Administered 2024-07-20: 100 ug via INTRAVENOUS

## 2024-07-20 MED ORDER — MOXIFLOXACIN HCL 0.5 % OP SOLN
OPHTHALMIC | Status: DC | PRN
Start: 2024-07-20 — End: 2024-07-20
  Administered 2024-07-20: .2 mL via OPHTHALMIC

## 2024-07-20 MED ORDER — SIGHTPATH DOSE#1 NA HYALUR & NA CHOND-NA HYALUR IO KIT
PACK | INTRAOCULAR | Status: DC | PRN
Start: 2024-07-20 — End: 2024-07-20
  Administered 2024-07-20: 1 via OPHTHALMIC

## 2024-07-20 MED ORDER — TETRACAINE HCL 0.5 % OP SOLN
OPHTHALMIC | Status: AC
  Filled 2024-07-20: qty 4

## 2024-07-20 MED ORDER — MIDAZOLAM HCL 5 MG/5ML IJ SOLN
INTRAMUSCULAR | Status: DC | PRN
  Administered 2024-07-20: 2 mg via INTRAVENOUS

## 2024-07-20 MED ORDER — LIDOCAINE HCL (PF) 2 % IJ SOLN
INTRAOCULAR | Status: DC | PRN
  Administered 2024-07-20: 4 mL via INTRAOCULAR

## 2024-07-20 MED ORDER — SIGHTPATH DOSE#1 BSS IO SOLN
INTRAOCULAR | Status: DC | PRN
Start: 2024-07-20 — End: 2024-07-20
  Administered 2024-07-20: 15 mL via INTRAOCULAR

## 2024-07-20 MED ORDER — ONDANSETRON HCL 4 MG/2ML IJ SOLN
4.0000 mg | Freq: Once | INTRAMUSCULAR | Status: AC
  Administered 2024-07-20: 4 mg via INTRAVENOUS

## 2024-07-20 MED ORDER — ONDANSETRON HCL 4 MG/2ML IJ SOLN
INTRAMUSCULAR | Status: AC
  Filled 2024-07-20: qty 2

## 2024-07-20 MED ORDER — ARMC OPHTHALMIC DILATING DROPS
OPHTHALMIC | Status: AC
  Filled 2024-07-20: qty 0.5

## 2024-07-20 MED ORDER — TETRACAINE HCL 0.5 % OP SOLN
1.0000 [drp] | OPHTHALMIC | Status: DC | PRN
  Administered 2024-07-20 (×3): 1 [drp] via OPHTHALMIC

## 2024-07-20 MED ORDER — LACTATED RINGERS IV SOLN: INTRAVENOUS | Status: DC

## 2024-07-20 MED ORDER — EPINEPHRINE PF 1 MG/ML IJ SOLN
INTRAMUSCULAR | Status: DC | PRN
  Administered 2024-07-20: 116 mL via OPHTHALMIC

## 2024-07-20 MED ORDER — MIDAZOLAM HCL 2 MG/2ML IJ SOLN
INTRAMUSCULAR | Status: AC
  Filled 2024-07-20: qty 2

## 2024-07-20 MED ORDER — ARMC OPHTHALMIC DILATING DROPS
1.0000 | OPHTHALMIC | Status: DC | PRN
  Administered 2024-07-20 (×3): 1 via OPHTHALMIC

## 2024-07-20 SURGICAL SUPPLY — 9 items
DISSECTOR HYDRO NUCLEUS 50X22 (MISCELLANEOUS) ×1 IMPLANT
FEE CATARACT SUITE SIGHTPATH (MISCELLANEOUS) ×1 IMPLANT
GLOVE PI ULTRA LF STRL 7.5 (GLOVE) ×1 IMPLANT
GLOVE SURG SYN 6.5 PF PI BL (GLOVE) ×1 IMPLANT
GLOVE SURG SYN 8.5 PF PI BL (GLOVE) ×1 IMPLANT
LENS IOL PAN PRO TRC 4 23.5 IMPLANT
NDL FILTER BLUNT 18X1 1/2 (NEEDLE) ×1 IMPLANT
NEEDLE FILTER BLUNT 18X1 1/2 (NEEDLE) ×1 IMPLANT
SYR 3ML LL SCALE MARK (SYRINGE) ×1 IMPLANT

## 2024-07-20 NOTE — Anesthesia Postprocedure Evaluation (Signed)
 Anesthesia Post Note  Patient: Tiffany Gregory  Procedure(s) Performed: PHACOEMULSIFICATION, CATARACT, WITH IOL INSERTION 5.60 00:36.5 (Right: Eye)  Patient location during evaluation: PACU Anesthesia Type: MAC Level of consciousness: awake and alert Pain management: pain level controlled Vital Signs Assessment: post-procedure vital signs reviewed and stable Respiratory status: spontaneous breathing, nonlabored ventilation, respiratory function stable and patient connected to nasal cannula oxygen Cardiovascular status: stable and blood pressure returned to baseline Postop Assessment: no apparent nausea or vomiting Anesthetic complications: no   No notable events documented.   Last Vitals:  Vitals:   07/20/24 1007 07/20/24 1013  BP: 129/66 137/69  Pulse: 75 79  Resp: 12 17  Temp: (!) 36.2 C (!) 36.2 C  SpO2: 99% 98%    Last Pain:  Vitals:   07/20/24 1013  TempSrc:   PainSc: 0-No pain                 Magaby Rumberger C Mael Delap

## 2024-07-20 NOTE — Transfer of Care (Signed)
 Immediate Anesthesia Transfer of Care Note  Patient: Tiffany Gregory  Procedure(s) Performed: PHACOEMULSIFICATION, CATARACT, WITH IOL INSERTION 5.60 00:36.5 (Right: Eye)  Patient Location: PACU  Anesthesia Type: MAC  Level of Consciousness: awake, alert  and patient cooperative  Airway and Oxygen Therapy: Patient Spontanous Breathing and Patient connected to supplemental oxygen  Post-op Assessment: Post-op Vital signs reviewed, Patient's Cardiovascular Status Stable, Respiratory Function Stable, Patent Airway and No signs of Nausea or vomiting  Post-op Vital Signs: Reviewed and stable  Complications: No notable events documented.

## 2024-07-20 NOTE — H&P (Signed)
 [facility]   Primary Care Physician:  Dineen Rollene MATSU, FNP Ophthalmologist: Dr. Adine Novak  Pre-Procedure History & Physical: HPI:  Tiffany Gregory is a 73 y.o. female here for cataract surgery.   Past Medical History:  Diagnosis Date   Allergy    Asthma    as a child   Bell's palsy    eye closes by itself   Complication of anesthesia    PONV   Diabetes mellitus without complication (HCC)    Elevated liver enzymes    Fatty liver    GERD (gastroesophageal reflux disease)    Hepatic steatosis    History of hepatitis B 1971   She had IV drug use experimentation and was hosptialized and reports treated.    Hyperlipidemia    Hypertension    PONV (postoperative nausea and vomiting)    PONV (postoperative nausea and vomiting)    Ptosis, left eyelid    Subdural hematoma (HCC)     Past Surgical History:  Procedure Laterality Date   ABDOMINAL HYSTERECTOMY     BREAST BIOPSY Left    Biopsy done in [place]    CATARACT EXTRACTION W/PHACO Left 07/06/2024   Procedure: PHACOEMULSIFICATION, CATARACT, WITH IOL INSERTION 6.10, 00:41.3;  Surgeon: Novak Adine Anes, MD;  Location: [facility];  Service: Ophthalmology;  Laterality: Left;   CRANIOTOMY  10/09/2019   slipped on ice, hematoma   CRANIOTOMY N/A    repositioning of drain -2 days post op- 2021   NASAL FRACTURE SURGERY  1975   NASAL SINUS SURGERY  2010   TUBAL LIGATION Bilateral     Prior to Admission medications   Medication Sig Start Date End Date Taking? Authorizing Provider  acetaminophen (TYLENOL) 325 MG tablet Take 650 mg by mouth every 6 (six) hours as needed.   Yes [provider]  aspirin EC 81 MG tablet Take 81 mg by mouth daily. Swallow whole.   Yes [provider]  atorvastatin  (LIPITOR) 40 MG tablet TAKE 1 TABLET(40 MG) BY MOUTH DAILY 12/19/23  Yes Dineen Rollene MATSU, FNP  famotidine  (PEPCID ) 20 MG tablet TAKE 1 TABLET(20 MG) BY MOUTH AT BEDTIME 06/19/24  Yes Dineen Rollene MATSU,  FNP  metFORMIN  (GLUCOPHAGE -XR) 500 MG 24 hr tablet TAKE 1 TABLET(500 MG) BY MOUTH DAILY WITH BREAKFAST 05/03/24  Yes Dineen Rollene MATSU, FNP  omeprazole  (PRILOSEC) 20 MG capsule TAKE 2 CAPSULES(40 MG) BY MOUTH DAILY 03/23/24  Yes Dineen Rollene MATSU, FNP  OZEMPIC , 2 MG/DOSE, 8 MG/3ML SOPN INJECT 2MG  INTO SKIN ONCE WEEKLY 06/01/24  Yes Dineen Rollene MATSU, FNP  polyethylene glycol (MIRALAX / GLYCOLAX) 17 g packet Take 17 g by mouth daily.   Yes [provider]  ramipril  (ALTACE ) 10 MG capsule TAKE 2 CAPSULES(20 MG) BY MOUTH DAILY 06/19/24  Yes Arnett, Rollene MATSU, FNP  Blood Glucose Monitoring Suppl (ONE TOUCH ULTRA 2) w/Device KIT 1 Device by Does not apply route daily. 07/12/22   Dineen Rollene MATSU, FNP  fluticasone  (FLONASE ) 50 MCG/ACT nasal spray SHAKE LIQUID AND USE 2 SPRAYS IN EACH NOSTRIL DAILY Patient taking differently: daily as needed. 06/06/22   Dineen Rollene MATSU, FNP  glucose blood test strip 1 each by Other route as needed for other. Use as instructed. One touch ultra blue in vitro strips 07/14/21   [name], Rosaline RAMAN, FNP  OneTouch Delica Lancets 33G MISC by Does not apply route.    [provider]    Allergies as of 06/19/2024 - Review Complete 06/19/2024  Allergen Reaction Noted  Lemon flavoring agent (non-screening) Anaphylaxis 01/15/2021   Amlodipine  Other (See Comments) 03/26/2023   Keflex  [cephalexin ] Itching 09/25/2021   Mango flavor [flavoring agent] Hives 03/30/2024   [name] Other (See Comments) 11/30/2020   Neosporin [bacitracin-polymyxin b] Other (See Comments) 06/17/2023   Sulfa antibiotics Other (See Comments) 06/14/2020   Valsartan  Hives 02/09/2022    Family History  Problem Relation Age of Onset   Diabetes Mother    Heart attack Mother 4   Diabetes Father    Heart disease Father 33       dief at 25   Hypertension Father    Diabetes Sister    COPD Sister    Heart disease Sister    Diabetes Brother    Heart attack Brother     Heart disease Brother 24   Dementia Brother        died at 64   Heart attack Maternal Grandmother    Cancer Maternal Grandfather    Heart attack Paternal Grandmother    Rheumatic fever Paternal Grandmother    Cancer Paternal Grandfather    Heart attack Paternal Aunt 98   Colon cancer Neg Hx     Social History   Socioeconomic History   Marital status: Widowed    Spouse name: Effa Yarrow   Number of children: 2   Years of education: 16   Highest education level: Bachelor's degree (e.g., BA, AB, BS)  Occupational History   Occupation: self employed  Tobacco Use   Smoking status: Never    Passive exposure: Past   Smokeless tobacco: Never  Vaping Use   Vaping status: Never Used  Substance and Sexual Activity   Alcohol use: Yes    Comment: ocaisionally   Drug use: Yes    Types: Marijuana    Comment: as younger adult   Sexual activity: Not Currently    Partners: Male  Other Topics Concern   Not on file  Social History Narrative   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    Social Drivers of Corporate investment banker Strain: Low Risk  (06/12/2024)   Overall Financial Resource Strain (CARDIA)    Difficulty of Paying Living Expenses: Not hard at all  Food Insecurity: No Food Insecurity (06/12/2024)   Hunger Vital Sign    Worried About Running Out of Food in the Last Year: Never true    Ran Out of Food in the Last Year: Never true  Transportation Needs: No Transportation Needs (06/12/2024)   PRAPARE - Administrator, Civil Service (Medical): No    Lack of Transportation (Non-Medical): No  Physical Activity: Insufficiently Active (06/12/2024)   Exercise Vital Sign    Days of Exercise per Week: 4 days    Minutes of Exercise per Session: 30 min  Stress: No Stress Concern Present (06/12/2024)   [employer] of Occupational Health - Occupational Stress Questionnaire    Feeling of  Stress: Not at all  Social Connections: Moderately Isolated (06/12/2024)   Social Connection and Isolation Panel    Frequency of Communication with Friends and Family: More than three times a week    Frequency of Social Gatherings with Friends and Family: Once a week    Attends Religious Services: Never    Database administrator or Organizations: Yes    Attends Engineer, structural: More than 4 times per year  Marital Status: Widowed  Intimate Partner Violence: Not At Risk (06/19/2024)   Humiliation, Afraid, Rape, and Kick questionnaire    Fear of Current or Ex-Partner: No    Emotionally Abused: No    Physically Abused: No    Sexually Abused: No    Review of Systems: See HPI, otherwise negative ROS  Physical Exam: BP (!) 139/92   Temp 98.4 F (36.9 C) (Temporal)   Resp 12   Ht 5' 4.02 (1.626 m)   Wt 62.6 kg   SpO2 100%   BMI 23.68 kg/m  General:   Alert, cooperative. Head:  Normocephalic and atraumatic. Respiratory:  Normal work of breathing. Cardiovascular:  NAD  Impression/Plan: Tiffany Gregory is here for cataract surgery.  Risks, benefits, limitations, and alternatives regarding cataract surgery have been reviewed with the patient.  Questions have been answered.  All parties agreeable.   Adine Novak, MD  07/20/2024, 9:38 AM

## 2024-07-20 NOTE — Op Note (Signed)
 OPERATIVE NOTE  Tiffany Gregory [id number] 07/20/2024   PREOPERATIVE DIAGNOSIS:  Nuclear sclerotic cataract right eye.  H25.11   POSTOPERATIVE DIAGNOSIS:    Nuclear sclerotic cataract right eye.     PROCEDURE:  Phacoemusification with posterior chamber intraocular lens placement of the right eye   LENS:   Implant Name Type Inv. Item Serial No. Manufacturer Lot No. LRB No. Used Action  LENS IOL PAN PRO TRC 4 23.5 - [id number]  LENS IOL PAN PRO TRC 4 23.5 [id number] [employer]  Right 1 Implanted       Procedure(s): PHACOEMULSIFICATION, CATARACT, WITH IOL INSERTION 5.60 00:36.5 (Right)  SURGEON:  Adine Novak, MD, MPH  ANESTHESIOLOGIST: Anesthesiologist: Ola Donny BROCKS, MD CRNA: Niki Manus SAUNDERS, CRNA   ANESTHESIA:  Topical with tetracaine drops augmented with 1% preservative-free intracameral lidocaine.  ESTIMATED BLOOD LOSS: less than 1 mL.   COMPLICATIONS:  None.   DESCRIPTION OF PROCEDURE:  The patient was identified in the holding room and transported to the operating room and placed in the supine position under the operating microscope.  The right eye was identified as the operative eye and it was prepped and draped in the usual sterile ophthalmic fashion.  The verion system was registered without difficulty.   A 1.0 millimeter clear-corneal paracentesis was made at the 10:30 position. 0.5 ml of preservative-free 1% lidocaine with epinephrine was injected into the anterior chamber.  The anterior chamber was filled with viscoelastic.  A 2.4 millimeter keratome was used to make a near-clear corneal incision at the 8:00 position.  A curvilinear capsulorrhexis was made with a cystotome and capsulorrhexis forceps.  Balanced salt solution was used to hydrodissect and hydrodelineate the nucleus.   Phacoemulsification was then used in stop and chop fashion to remove the lens nucleus and epinucleus.  The remaining cortex was then removed using the irrigation and aspiration handpiece.  Viscoelastic was then placed into the capsular bag to distend it for lens placement.  A lens was then injected into the capsular bag.  The remaining viscoelastic was aspirated.  The lens was rotated with guidance from the verion system.   Wounds were hydrated with balanced salt solution.  The anterior chamber was inflated to a physiologic pressure with balanced salt solution. The lens was well centered.  Intracameral vigamox 0.1 mL undiluted was injected into the eye and a drop placed onto the ocular surface.  No wound leaks were noted.  The patient was taken to the recovery room in stable condition without complications of anesthesia or surgery  Adine Novak 07/20/2024, 10:06 AM

## 2024-07-20 NOTE — Progress Notes (Signed)
 Alaney Witter                                          MRN: [id number]   07/20/2024   The [facility] Quality Team Specialist reviewed this patient medical record for the purposes of chart review for care gap closure. The following were reviewed: chart review for care gap closure-colorectal cancer screening.    [facility] Quality Team

## 2024-07-20 NOTE — Progress Notes (Signed)
 Tiffany Gregory                                          MRN: [id number]   07/20/2024   The [facility] Quality Team Specialist reviewed this patient medical record for the purposes of chart review for care gap closure. The following were reviewed: abstraction for care gap closure-glycemic status assessment and kidney health evaluation for diabetes:eGFR  and uACR.    [facility] Quality Team

## 2024-07-23 ENCOUNTER — Ambulatory Visit
Admission: RE | Admit: 2024-07-23 | Discharge: 2024-07-23 | Disposition: A | Discharge status: Home / Self Care | Source: Ambulatory Visit | Attending: Family | Admitting: Family

## 2024-07-23 DIAGNOSIS — R9389 Abnormal findings on diagnostic imaging of other specified body structures: Secondary | ICD-10-CM | POA: Diagnosis not present

## 2024-07-23 DIAGNOSIS — R918 Other nonspecific abnormal finding of lung field: Secondary | ICD-10-CM | POA: Diagnosis not present

## 2024-07-27 ENCOUNTER — Encounter: Payer: Self-pay | Admitting: Family

## 2024-07-27 ENCOUNTER — Ambulatory Visit: Payer: Self-pay | Admitting: Family

## 2024-07-30 ENCOUNTER — Other Ambulatory Visit: Payer: Self-pay | Admitting: Family

## 2024-07-30 DIAGNOSIS — D649 Anemia, unspecified: Secondary | ICD-10-CM

## 2024-08-14 ENCOUNTER — Encounter: Payer: Self-pay | Admitting: Pharmacist

## 2024-08-14 NOTE — Progress Notes (Signed)
 Pharmacy Quality Measure Review  This patient is appearing on a report for being at risk of failing the Kidney Health Evaluation for Patients with Diabetes measure this calendar year.   Insurance report was not up to date.  UACR has resulted as of: Lab Results  Component Value Date   MICRALBCREAT 16.5 07/17/2024

## 2024-09-14 ENCOUNTER — Other Ambulatory Visit: Payer: Self-pay | Admitting: Family

## 2024-09-14 DIAGNOSIS — E119 Type 2 diabetes mellitus without complications: Secondary | ICD-10-CM

## 2024-09-16 ENCOUNTER — Other Ambulatory Visit: Payer: Self-pay | Admitting: Family

## 2024-09-19 ENCOUNTER — Other Ambulatory Visit: Payer: Self-pay | Admitting: Family

## 2024-09-19 DIAGNOSIS — K219 Gastro-esophageal reflux disease without esophagitis: Secondary | ICD-10-CM

## 2024-09-28 ENCOUNTER — Telehealth: Payer: Self-pay | Admitting: Family

## 2024-09-28 NOTE — Telephone Encounter (Signed)
 Patient came in the office and wanted to do labs. She stated that a nurse called her and wanted her to do labs. Unable to schedule, no lab orders and provider out of the office. Patient would like a call.

## 2024-09-29 ENCOUNTER — Other Ambulatory Visit: Payer: Self-pay

## 2024-09-29 ENCOUNTER — Other Ambulatory Visit (INDEPENDENT_AMBULATORY_CARE_PROVIDER_SITE_OTHER)

## 2024-09-29 DIAGNOSIS — R899 Unspecified abnormal finding in specimens from other organs, systems and tissues: Secondary | ICD-10-CM

## 2024-09-29 LAB — CBC WITH DIFFERENTIAL/PLATELET
Basophils Absolute: 0.1 K/uL (ref 0.0–0.1)
Basophils Relative: 0.5 % (ref 0.0–3.0)
Eosinophils Absolute: 0.2 K/uL (ref 0.0–0.7)
Eosinophils Relative: 2.2 % (ref 0.0–5.0)
HCT: 36.8 % (ref 36.0–46.0)
Hemoglobin: 12.6 g/dL (ref 12.0–15.0)
Lymphocytes Relative: 17.7 % (ref 12.0–46.0)
Lymphs Abs: 1.9 K/uL (ref 0.7–4.0)
MCHC: 34.3 g/dL (ref 30.0–36.0)
MCV: 80.6 fl (ref 78.0–100.0)
Monocytes Absolute: 0.5 K/uL (ref 0.1–1.0)
Monocytes Relative: 5.1 % (ref 3.0–12.0)
Neutro Abs: 7.9 K/uL — ABNORMAL HIGH (ref 1.4–7.7)
Neutrophils Relative %: 74.5 % (ref 43.0–77.0)
Platelets: 312 K/uL (ref 150.0–400.0)
RBC: 4.57 Mil/uL (ref 3.87–5.11)
RDW: 14.6 % (ref 11.5–15.5)
WBC: 10.6 K/uL — ABNORMAL HIGH (ref 4.0–10.5)

## 2024-09-29 NOTE — Telephone Encounter (Signed)
 LVM to inform pt that her lab order is in she may schedule appt to get repeat CBC done.

## 2024-09-30 NOTE — Telephone Encounter (Signed)
 Spoke to pt she has had labwork done

## 2024-10-13 ENCOUNTER — Ambulatory Visit: Admitting: Family

## 2024-10-13 ENCOUNTER — Ambulatory Visit: Payer: Self-pay | Admitting: Family

## 2024-10-13 ENCOUNTER — Encounter: Payer: Self-pay | Admitting: Family

## 2024-10-13 VITALS — BP 132/80 | HR 81 | Temp 98.2°F | Ht 64.02 in | Wt 139.2 lb

## 2024-10-13 DIAGNOSIS — Z7985 Long-term (current) use of injectable non-insulin antidiabetic drugs: Secondary | ICD-10-CM

## 2024-10-13 DIAGNOSIS — Z1231 Encounter for screening mammogram for malignant neoplasm of breast: Secondary | ICD-10-CM

## 2024-10-13 DIAGNOSIS — E785 Hyperlipidemia, unspecified: Secondary | ICD-10-CM | POA: Diagnosis not present

## 2024-10-13 DIAGNOSIS — R9389 Abnormal findings on diagnostic imaging of other specified body structures: Secondary | ICD-10-CM | POA: Diagnosis not present

## 2024-10-13 DIAGNOSIS — D72829 Elevated white blood cell count, unspecified: Secondary | ICD-10-CM

## 2024-10-13 DIAGNOSIS — E119 Type 2 diabetes mellitus without complications: Secondary | ICD-10-CM

## 2024-10-13 DIAGNOSIS — J329 Chronic sinusitis, unspecified: Secondary | ICD-10-CM | POA: Diagnosis not present

## 2024-10-13 DIAGNOSIS — E1169 Type 2 diabetes mellitus with other specified complication: Secondary | ICD-10-CM

## 2024-10-13 LAB — POCT GLYCOSYLATED HEMOGLOBIN (HGB A1C): Hemoglobin A1C: 5.9 % — AB (ref 4.0–5.6)

## 2024-10-13 MED ORDER — ATORVASTATIN CALCIUM 40 MG PO TABS: 40.0000 mg | ORAL_TABLET | Freq: Every day | ORAL | 3 refills | Status: AC

## 2024-10-13 NOTE — Progress Notes (Signed)
 "  Assessment & Plan:  Type 2 diabetes mellitus without complication, without long-term current use of insulin (HCC) Assessment & Plan: Chronic, excellent control.Continue Ozempic  2 mg weekly. Advised to stop metformin  500 mg daily.  discussed chronic constipation prior to GLP1.  Counseled on risk of constipation on GLP-1.  continue MiraLAX daily for maintenance of bowel health.  Orders: -     Atorvastatin  Calcium ; Take 1 tablet (40 mg total) by mouth daily.  Dispense: 90 tablet; Refill: 3 -     Microalbumin / creatinine urine ratio -     POCT glycosylated hemoglobin (Hb A1C)  Hyperlipidemia associated with type 2 diabetes mellitus (HCC) -     Atorvastatin  Calcium ; Take 1 tablet (40 mg total) by mouth daily.  Dispense: 90 tablet; Refill: 3  Leukocytosis, unspecified type -     CBC with Differential/Platelet; Future  Abnormal CT of the chest -     Pulmonary Visit  Encounter for screening mammogram for malignant neoplasm of breast -     3D Screening Mammogram, Left and Right; Future  Sinusitis, unspecified chronicity, unspecified location Assessment & Plan: Chronic, suboptimal control. She experiences persistent clear nasal discharge and occasional sinus headaches. Advised Claritin or Zyrtec for antihistamine management. Try azelastine nasal spray for a less sedating option.  She will let me know how she is doing. Of note, pending pulmonology consult due to abnormal CT chest 07/2024.   Hyperlipidemia, unspecified hyperlipidemia type Assessment & Plan: Chronic, stable.  Conitinue lipitor 40mg .       Return precautions given.   Risks, benefits, and alternatives of the medications and treatment plan prescribed today were discussed, and patient expressed understanding.   Education regarding symptom management and diagnosis given to patient on AVS either electronically or printed.  No follow-ups on file.  Rollene Northern, FNP  Subjective:    Patient ID: Tiffany Gregory,  female    DOB: 21-Apr-1951, 74 y.o.   MRN: [id number]  CC: Tiffany Gregory is a 74 y.o. female who presents today for follow up.   HPI: HPI Discussed the use of AI scribe software for clinical note transcription with the patient, who gave verbal consent to proceed.  History of Present Illness   Tiffany Gregory is a 74 year old female with diabetes and hypertension who presents for a follow-up visit concerning her diabetes management.  She is currently on Ozempic  2 mg and Metformin  500 mg once daily for diabetes management. She missed taking Ozempic  for two weeks but noted her blood sugars remained stable during that period.  She continues to take Miralax daily to maintain regular bowel movements.  She has a history of chronic sinusitis since childhood, characterized by a constantly runny nose. She occasionally takes Benadryl at bedtime for sinus headaches and has recently used Afrin nasal spray. She underwent sinus surgery in 2010, which provided long-term relief, but symptoms have since returned. She uses antihistamines like Zyrtec and Flonase  as needed.  She also has a history of asthma and is a nonsmoker.   Socially, she lives alone with her dogs and has a support network with her neighbors, providing their contact information to her family in case of emergencies. She is active in a widowers' group and is adjusting to life after her husband's passing.     Follow-up neurology 09/11/2024  Chronic Bell's palsy with residual left eye weakness, including spasms and incomplete opening. Daughter has similar residual effects. - Continue facial exercises  - Continue to see [facility] (Dr. Myrna)  -  Encouraged daily facial exercises to improve eye function. - Recommended setting reminders to perform exercises regularly.    Cologuard is pending; GI consult 12/29/2024         Abnormal chest CT, evaluate for pulmonary fibrosis   07/23/24 An abnormal chest CT is suggestive of pulmonary fibrosis.  She has a history of asthma but no current respiratory symptoms. Referral to pulmonology for evaluation of potential pulmonary fibrosis has been discussed and initiated.      Allergies: Lemon flavoring agent (non-screening), Amlodipine , Benzocaine, Keflex  [cephalexin ], Mango flavor [flavoring agent], [name], Neosporin [bacitracin-polymyxin b], Sulfa antibiotics, and Valsartan  Medications Ordered Prior to Encounter[1]  Review of Systems  Constitutional:  Negative for chills and fever.  HENT:  Positive for congestion, postnasal drip and rhinorrhea. Negative for sinus pressure and sinus pain.   Respiratory:  Negative for cough.   Cardiovascular:  Negative for chest pain and palpitations.  Gastrointestinal:  Negative for nausea and vomiting.      Objective:    BP 132/80   Pulse 81   Temp 98.2 F (36.8 C)   Ht 5' 4.02 (1.626 m)   Wt 139 lb 3.2 oz (63.1 kg)   SpO2 97%   BMI 23.88 kg/m  BP Readings from Last 3 Encounters:  10/13/24 132/80  07/20/24 137/69  07/06/24 139/77   Wt Readings from Last 3 Encounters:  10/13/24 139 lb 3.2 oz (63.1 kg)  07/20/24 138 lb (62.6 kg)  07/06/24 139 lb 9.6 oz (63.3 kg)   Lab Results  Component Value Date   HGBA1C 5.9 (A) 10/13/2024   Lab Results  Component Value Date   LDLCALC 40 07/17/2024    Physical Exam Vitals reviewed.  Constitutional:      Appearance: She is well-developed.  HENT:     Head: Normocephalic and atraumatic.     Right Ear: Hearing, tympanic membrane, ear canal and external ear normal. No decreased hearing noted. No drainage, swelling or tenderness. No middle ear effusion. No foreign body. Tympanic membrane is not erythematous or bulging.     Left Ear: Hearing, tympanic membrane, ear canal and external ear normal. No decreased hearing noted. No drainage, swelling or tenderness.  No middle ear effusion. No foreign body. Tympanic membrane is not erythematous or bulging.     Nose: Nose normal. No rhinorrhea.      Right Sinus: No maxillary sinus tenderness or frontal sinus tenderness.     Left Sinus: No maxillary sinus tenderness or frontal sinus tenderness.     Mouth/Throat:     Pharynx: Uvula midline. No oropharyngeal exudate or posterior oropharyngeal erythema.     Tonsils: No tonsillar abscesses.  Eyes:     Conjunctiva/sclera: Conjunctivae normal.  Cardiovascular:     Rate and Rhythm: Normal rate and regular rhythm.     Pulses: Normal pulses.     Heart sounds: Normal heart sounds.  Pulmonary:     Effort: Pulmonary effort is normal.     Breath sounds: Normal breath sounds. No wheezing, rhonchi or rales.  Lymphadenopathy:     Head:     Right side of head: No submental, submandibular, tonsillar, preauricular, posterior auricular or occipital adenopathy.     Left side of head: No submental, submandibular, tonsillar, preauricular, posterior auricular or occipital adenopathy.     Cervical: No cervical adenopathy.  Skin:    General: Skin is warm and dry.  Neurological:     Mental Status: She is alert.  Psychiatric:  Speech: Speech normal.        Behavior: Behavior normal.        Thought Content: Thought content normal.            [1]  Current Outpatient Medications on File Prior to Visit  Medication Sig Dispense Refill   Calcium -Phosphorus-Vitamin D  (CALCIUM /VITAMIN D3/ADULT GUMMY PO) Take by mouth.     oxymetazoline (AFRIN) 0.05 % nasal spray Place 1 spray into both nostrils 2 (two) times daily.     acetaminophen (TYLENOL) 325 MG tablet Take 650 mg by mouth every 6 (six) hours as needed.     aspirin EC 81 MG tablet Take 81 mg by mouth daily. Swallow whole.     Blood Glucose Monitoring Suppl (ONE TOUCH ULTRA 2) w/Device KIT 1 Device by Does not apply route daily. 1 kit 0   famotidine  (PEPCID ) 20 MG tablet TAKE 1 TABLET(20 MG) BY MOUTH AT BEDTIME 90 tablet 3   fluticasone  (FLONASE ) 50 MCG/ACT nasal spray SHAKE LIQUID AND USE 2 SPRAYS IN EACH NOSTRIL DAILY (Patient not taking:  Reported on 10/13/2024) 48 g 3   glucose blood test strip 1 each by Other route as needed for other. Use as instructed. One touch ultra blue in vitro strips 100 each 3   omeprazole  (PRILOSEC) 20 MG capsule TAKE 2 CAPSULES(40 MG) BY MOUTH DAILY 180 capsule 3   OneTouch Delica Lancets 33G MISC by Does not apply route.     OZEMPIC , 2 MG/DOSE, 8 MG/3ML SOPN INJECT 2MG  INTO SKIN ONCE WEEKLY 3 mL 2   polyethylene glycol (MIRALAX / GLYCOLAX) 17 g packet Take 17 g by mouth daily.     ramipril  (ALTACE ) 10 MG capsule TAKE 2 CAPSULES(20 MG) BY MOUTH DAILY 180 capsule 3   No current facility-administered medications on file prior to visit.   "

## 2024-10-13 NOTE — Patient Instructions (Addendum)
 Trial of azelastine nasal spray ( antihistamine)  You may also take zyrtec once to twice daily for clear nasal congestion  Consider trial stop of metformin   Referral to pulmonology as discussed based on abnormal CT high-resolution  Let us  know if you dont hear back within 2 weeks in regards to an appointment being scheduled.   So that you are aware, if you are [facility] MyChart user , please pay attention to your MyChart messages as you may receive a MyChart message with a phone number to call and schedule this test/appointment own your own from our referral coordinator. This is a new process so I do not want you to miss this message.  If you are not a MyChart user, you will receive a phone call.

## 2024-10-14 ENCOUNTER — Ambulatory Visit: Payer: Self-pay | Admitting: Family

## 2024-10-14 LAB — MICROALBUMIN / CREATININE URINE RATIO
Creatinine,U: 83.8 mg/dL
Microalb Creat Ratio: 8.8 mg/g (ref 0.0–30.0)
Microalb, Ur: 0.7 mg/dL (ref 0.7–1.9)

## 2024-10-15 DIAGNOSIS — J329 Chronic sinusitis, unspecified: Secondary | ICD-10-CM | POA: Insufficient documentation

## 2024-10-15 NOTE — Assessment & Plan Note (Signed)
 Chronic, stable.  Conitinue lipitor 40mg .

## 2024-10-15 NOTE — Assessment & Plan Note (Addendum)
 Chronic, suboptimal control. She experiences persistent clear nasal discharge and occasional sinus headaches. Advised Claritin or Zyrtec for antihistamine management. Try azelastine nasal spray for a less sedating option.  She will let me know how she is doing. Of note, pending pulmonology consult due to abnormal CT chest 07/2024.

## 2024-10-15 NOTE — Assessment & Plan Note (Signed)
 Chronic, excellent control.Continue Ozempic  2 mg weekly. Advised to stop metformin  500 mg daily.  discussed chronic constipation prior to GLP1.  Counseled on risk of constipation on GLP-1.  continue MiraLAX daily for maintenance of bowel health.

## 2024-10-21 ENCOUNTER — Encounter: Payer: Self-pay | Admitting: Student in an Organized Health Care Education/Training Program

## 2024-10-21 ENCOUNTER — Ambulatory Visit: Admitting: Student in an Organized Health Care Education/Training Program

## 2024-10-21 VITALS — BP 138/80 | HR 86 | Temp 98.4°F | Ht 64.02 in | Wt 140.2 lb

## 2024-10-21 DIAGNOSIS — J849 Interstitial pulmonary disease, unspecified: Secondary | ICD-10-CM

## 2024-10-21 DIAGNOSIS — J4520 Mild intermittent asthma, uncomplicated: Secondary | ICD-10-CM

## 2024-10-21 DIAGNOSIS — J45909 Unspecified asthma, uncomplicated: Secondary | ICD-10-CM

## 2024-10-21 DIAGNOSIS — K219 Gastro-esophageal reflux disease without esophagitis: Secondary | ICD-10-CM | POA: Diagnosis not present

## 2024-10-21 NOTE — Patient Instructions (Signed)
" °  VISIT SUMMARY: During your visit, we discussed the findings from your recent CT scans which suggest the possibility of interstitial lung disease. We also reviewed your history of asthma and gastroesophageal reflux disease (GERD).  YOUR PLAN: -INTERSTITIAL LUNG DISEASE: Interstitial lung disease refers to a group of disorders that cause scarring of the lungs, making it difficult to breathe. Your CT scan showed mild and faint findings suggestive of this condition. We have ordered blood tests to check for allergies and autoimmune markers, and scheduled a swallowing study and a pulmonary function test to further assess your condition. Please complete the ILD questionnaire before your next visit.  -ASTHMA: Asthma is a condition in which your airways narrow and swell, producing extra mucus and making it difficult to breathe. Your asthma is currently well-controlled with no recent symptoms. We have scheduled a pulmonary function test to assess your baseline lung function.  -GASTROESOPHAGEAL REFLUX DISEASE (GERD): GERD is a digestive disorder where stomach acid irritates the food pipe lining. Your reflux symptoms have been managed with Prilosec, but we need to assess if it is contributing to your lung findings. We have scheduled a swallowing study to evaluate this further.  INSTRUCTIONS: Please complete the ILD questionnaire before your next visit. Attend the scheduled blood tests, swallowing study, and pulmonary function test. Follow up with us  after these tests are completed.                      Contains text generated by [employer]bridge.                                 Contains text generated by Abridge.   "

## 2024-10-21 NOTE — Progress Notes (Signed)
 " Assessment & Plan  #Interstitial lung disease    CT scan shows very mild and faint findings suggestive of interstitial lung disease, primarily basilar and peripheral with ground glass opacities and scattered subpleural densities. Findings are indeterminate for UIP per guidelines. She has no respiratory symptoms such as shortness of breath, chest pain, or wheezing. Differential diagnosis includes UIP/IPF, HSP, CTD-ILD/IPAF, or GERD related changes.   Will obtain workup for auto-immune disease, as well as a PFT's to assess her baseline lung function. Will also obtain a double contrast esophagogram to assess for overt reflux. Provided ILD questionnaire for completion before next visit.  - ANA 12 Plus Profile (RDL) - ANCA Profile - Anti-CCP Ab, IgG + IgA (RDL) - CK - Hypersensitivity Pneumonitis - MyoMarker 3 Plus Profile (RDL) - Rheumatoid factor - DG ESOPHAGUS W DOUBLE CM (HD); Future - Pulmonary Function Test; Future - Aldolase  #Asthma    Mild asthma is well-controlled with no recent exacerbations or current symptoms of wheezing, cough, or shortness of breath. Asthma was previously exercise-induced and flared with colds or smoke exposure. Scheduled a pulmonary function test to assess baseline lung function.  #Gastroesophageal reflux disease    Reflux symptoms were previously managed with Prilosec. Esophageal narrowing was noted during an endoscopic procedure. Reflux may contribute to lung findings. Scheduled a swallowing study to assess for reflux.    Return in about 6 weeks (around 12/02/2024).  Belva November, MD [facility]  I spent 60 minutes caring for this patient today, including preparing to see the patient, obtaining a medical history , reviewing a separately obtained history, performing a medically appropriate examination and/or evaluation, counseling and educating the patient/family/caregiver, ordering medications, tests, or procedures, documenting  clinical information in the electronic health record, and independently interpreting results (not separately reported/billed) and communicating results to the patient/family/caregiver  End of visit medications:  No orders of the defined types were placed in this encounter.   Current Medications[1]   Subjective:   PATIENT ID: Arland Satterfield GENDER: female DOB: Jul 11, 1951, MRN: [id number]  Chief Complaint  Patient presents with   Consult    Nodule. CT scan in October. History of Asthma. No SOB, wheezing or cough.    HPI  Discussed the use of AI scribe software for clinical note transcription with the patient, who gave verbal consent to proceed.  History of Present Illness  Deniese Oberry is a 74 year old female who presents for evaluation of an abnormal CT scan suggestive of interstitial lung disease.  A cardiac CT scan, performed due to concerns about her family history, incidentally revealed findings suggestive of interstitial lung disease. A subsequent dedicated chest CT scan was performed after the initial abnormal cardiac CT scan.  She has no respiratory symptoms such as shortness of breath, chest pain, or regular cough, and denies any wheezing. Her asthma, which was exercise-induced, has been well-controlled for many years, with no recent exacerbations or need for regular inhaler use.  Her past medical history includes polycystic ovarian syndrome, a hysterectomy in 1991 due to a cancerous uterine tumor, diabetes currently managed with Ozempic , and a history of craniotomies in 2021 following a fall. She also has a history of esophageal narrowing and occasional reflux. She takes ramipril , atorvastatin , and aspirin daily. She is also on omeprazole .  Social history reveals she was raised in a smoking household but has never smoked herself. Her husband, who passed away three years ago, smoked outside the home. 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 She has two dogs and is allergic to cats, which exacerbates her asthma and allergies.  Family history includes her daughter also being unable to tolerate cats and a family history of Bell's palsy, which she experienced in 2022. She denies any personal or family history of lupus.   Ancillary information including prior medications, full medical/surgical/family/social histories, and PFTs (when available) are listed below and have been reviewed.    Review of Systems  Constitutional:  Negative for chills, fever and weight loss.  Respiratory:  Negative for cough, hemoptysis, sputum production, shortness of breath and wheezing.   Cardiovascular:  Negative for chest pain.     Objective:   Vitals:   10/21/24 1038  BP: 138/80  Pulse: 86  Temp: 98.4 F (36.9 C)  SpO2: 100%  Weight: 140 lb 3.2 oz (63.6 kg)  Height: 5' 4.02 (1.626 m)   100% on RA BMI Readings from Last 3 Encounters:  10/21/24 24.05 kg/m  10/13/24 23.88 kg/m  07/20/24 23.68 kg/m   Wt Readings from Last 3 Encounters:  10/21/24 140 lb 3.2 oz (63.6 kg)  10/13/24 139 lb 3.2 oz (63.1 kg)  07/20/24 138 lb (62.6 kg)    Physical Exam Constitutional:      Appearance: Normal appearance.  Cardiovascular:     Rate and Rhythm: Normal rate and regular rhythm.     Pulses: Normal pulses.     Heart sounds: Normal heart sounds.  Pulmonary:     Effort: Pulmonary effort is normal. No respiratory distress.     Breath sounds: Normal breath sounds. No wheezing or rales.  Neurological:     General: No focal deficit present.     Mental Status: She is alert and oriented to person, place, and time. Mental status is at baseline.     Ancillary Information    Past Medical History:  Diagnosis Date   Allergy    Asthma    as a child   Bell's palsy    eye closes by itself   Complication of anesthesia    PONV   Diabetes mellitus without complication (HCC)    Elevated liver  enzymes    Fatty liver    GERD (gastroesophageal reflux disease)    Hepatic steatosis    History of hepatitis B 1971   She had IV drug use experimentation and was hosptialized and reports treated.    Hyperlipidemia    Hypertension    PONV (postoperative nausea and vomiting)    PONV (postoperative nausea and vomiting)    Ptosis, left eyelid    Subdural hematoma (HCC)      Family History  Problem Relation Age of Onset   Diabetes Mother    Heart attack Mother 74   Diabetes Father    Heart disease Father 57       dief at 58   Hypertension Father    Diabetes Sister    COPD Sister    Heart disease Sister    Diabetes Brother    Heart attack Brother    Heart disease Brother 26   Dementia Brother        died at 5   Heart attack Maternal Grandmother    Cancer Maternal Grandfather    Heart attack Paternal Grandmother    Rheumatic fever Paternal Grandmother    Cancer Paternal Grandfather    Heart attack Paternal Aunt 67   Colon cancer Neg Hx  Past Surgical History:  Procedure Laterality Date   ABDOMINAL HYSTERECTOMY     h/o uterine cancer 1991 dx   BREAST BIOPSY Left    Biopsy done in [place]    CATARACT EXTRACTION W/PHACO Left 07/06/2024   Procedure: PHACOEMULSIFICATION, CATARACT, WITH IOL INSERTION 6.10, 00:41.3;  Surgeon: Myrna Adine Anes, MD;  Location: [facility];  Service: Ophthalmology;  Laterality: Left;   CATARACT EXTRACTION W/PHACO Right 07/20/2024   Procedure: PHACOEMULSIFICATION, CATARACT, WITH IOL INSERTION 5.60 00:36.5;  Surgeon: Myrna Adine Anes, MD;  Location: [facility];  Service: Ophthalmology;  Laterality: Right;   CRANIOTOMY  10/09/2019   slipped on ice, hematoma   CRANIOTOMY N/A    repositioning of drain -2 days post op- 2021   NASAL FRACTURE SURGERY  1975   NASAL SINUS SURGERY  2010   TUBAL LIGATION Bilateral     Social History   Socioeconomic History   Marital status: Widowed    Spouse name: Idabelle Mcpeters   Number  of children: 2   Years of education: 16   Highest education level: Bachelor's degree (e.g., BA, AB, BS)  Occupational History   Occupation: self employed  Tobacco Use   Smoking status: Never    Passive exposure: Past   Smokeless tobacco: Never  Vaping Use   Vaping status: Never Used  Substance and Sexual Activity   Alcohol use: Yes    Comment: ocaisionally   Drug use: Yes    Types: Marijuana    Comment: as younger adult   Sexual activity: Not Currently    Partners: Male  Other Topics Concern   Not on file  Social History Narrative   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    Social Drivers of Health   Tobacco Use: Low Risk (10/21/2024)   Patient History    Smoking Tobacco Use: Never    Smokeless Tobacco Use: Never    Passive Exposure: Past  Financial Resource Strain: Low Risk (06/12/2024)   Overall Financial Resource Strain (CARDIA)    Difficulty of Paying Living Expenses: Not hard at all  Food Insecurity: No Food Insecurity (06/12/2024)   Epic    Worried About Programme Researcher, Broadcasting/film/video in the Last Year: Never true    Ran Out of Food in the Last Year: Never true  Transportation Needs: No Transportation Needs (06/12/2024)   Epic    Lack of Transportation (Medical): No    Lack of Transportation (Non-Medical): No  Physical Activity: Insufficiently Active (06/12/2024)   Exercise Vital Sign    Days of Exercise per Week: 4 days    Minutes of Exercise per Session: 30 min  Stress: No Stress Concern Present (06/12/2024)   [employer] of Occupational Health - Occupational Stress Questionnaire    Feeling of Stress: Not at all  Social Connections: Moderately Isolated (06/12/2024)   Social Connection and Isolation Panel    Frequency of Communication with Friends and Family: More than three times a week    Frequency of Social Gatherings with Friends and Family: Once a week    Attends Religious Services: Never     Database Administrator or Organizations: Yes    Attends Engineer, Structural: More than 4 times per year    Marital Status: Widowed  Intimate Partner Violence: Not At Risk (06/19/2024)   Epic    Fear of Current or Ex-Partner: No    Emotionally  Abused: No    Physically Abused: No    Sexually Abused: No  Depression (PHQ2-9): Low Risk (10/13/2024)   Depression (PHQ2-9)    PHQ-2 Score: 3  Alcohol Screen: Low Risk (06/12/2024)   Alcohol Screen    Last Alcohol Screening Score (AUDIT): 1  Housing: Unknown (09/11/2024)   Received from [facility]   Epic    Unable to Pay for Housing in the Last Year: Not on file    Number of Times Moved in the Last Year: Not on file    At any time in the past 12 months, were you homeless or living in a shelter (including now)?: No  Utilities: Not At Risk (06/19/2024)   Epic    Threatened with loss of utilities: No  Health Literacy: Adequate Health Literacy (06/19/2024)   B1300 Health Literacy    Frequency of need for help with medical instructions: Never     Allergies[2]   CBC    Component Value Date/Time   WBC 10.6 (H) 09/29/2024 1022   RBC 4.57 09/29/2024 1022   HGB 12.6 09/29/2024 1022   HGB 12.4 01/24/2021 1525   HCT 36.8 09/29/2024 1022   HCT 37.1 01/24/2021 1525   PLT 312.0 09/29/2024 1022   PLT 320 01/24/2021 1525   MCV 80.6 09/29/2024 1022   MCV 83 01/24/2021 1525   MCH 27.0 03/30/2024 1237   MCHC 34.3 09/29/2024 1022   RDW 14.6 09/29/2024 1022   RDW 13.3 01/24/2021 1525   LYMPHSABS 1.9 09/29/2024 1022   LYMPHSABS 2.3 01/24/2021 1525   MONOABS 0.5 09/29/2024 1022   EOSABS 0.2 09/29/2024 1022   EOSABS 0.3 01/24/2021 1525   BASOSABS 0.1 09/29/2024 1022   BASOSABS 0.1 01/24/2021 1525    Pulmonary Functions Testing Results:     No data to display          Outpatient Medications Prior to Visit  Medication Sig Dispense Refill   acetaminophen (TYLENOL) 325 MG tablet Take 650 mg by mouth every 6 (six)  hours as needed.     aspirin EC 81 MG tablet Take 81 mg by mouth daily. Swallow whole.     atorvastatin  (LIPITOR) 40 MG tablet Take 1 tablet (40 mg total) by mouth daily. 90 tablet 3   Blood Glucose Monitoring Suppl (ONE TOUCH ULTRA 2) w/Device KIT 1 Device by Does not apply route daily. 1 kit 0   Calcium -Phosphorus-Vitamin D  (CALCIUM /VITAMIN D3/ADULT GUMMY PO) Take by mouth.     famotidine  (PEPCID ) 20 MG tablet TAKE 1 TABLET(20 MG) BY MOUTH AT BEDTIME 90 tablet 3   fluticasone  (FLONASE ) 50 MCG/ACT nasal spray SHAKE LIQUID AND USE 2 SPRAYS IN EACH NOSTRIL DAILY 48 g 3   glucose blood test strip 1 each by Other route as needed for other. Use as instructed. One touch ultra blue in vitro strips 100 each 3   omeprazole  (PRILOSEC) 20 MG capsule TAKE 2 CAPSULES(40 MG) BY MOUTH DAILY 180 capsule 3   OneTouch Delica Lancets 33G MISC by Does not apply route.     oxymetazoline (AFRIN) 0.05 % nasal spray Place 1 spray into both nostrils 2 (two) times daily.     OZEMPIC , 2 MG/DOSE, 8 MG/3ML SOPN INJECT 2MG  INTO SKIN ONCE WEEKLY 3 mL 2   polyethylene glycol (MIRALAX / GLYCOLAX) 17 g packet Take 17 g by mouth daily.     ramipril  (ALTACE ) 10 MG capsule TAKE 2 CAPSULES(20 MG) BY MOUTH DAILY 180 capsule 3   No facility-administered medications  prior to visit.      [1]  Current Outpatient Medications:    acetaminophen (TYLENOL) 325 MG tablet, Take 650 mg by mouth every 6 (six) hours as needed., Disp: , Rfl:    aspirin EC 81 MG tablet, Take 81 mg by mouth daily. Swallow whole., Disp: , Rfl:    atorvastatin  (LIPITOR) 40 MG tablet, Take 1 tablet (40 mg total) by mouth daily., Disp: 90 tablet, Rfl: 3   Blood Glucose Monitoring Suppl (ONE TOUCH ULTRA 2) w/Device KIT, 1 Device by Does not apply route daily., Disp: 1 kit, Rfl: 0   Calcium -Phosphorus-Vitamin D  (CALCIUM /VITAMIN D3/ADULT GUMMY PO), Take by mouth., Disp: , Rfl:    famotidine  (PEPCID ) 20 MG tablet, TAKE 1 TABLET(20 MG) BY MOUTH AT BEDTIME, Disp: 90  tablet, Rfl: 3   fluticasone  (FLONASE ) 50 MCG/ACT nasal spray, SHAKE LIQUID AND USE 2 SPRAYS IN EACH NOSTRIL DAILY, Disp: 48 g, Rfl: 3   glucose blood test strip, 1 each by Other route as needed for other. Use as instructed. One touch ultra blue in vitro strips, Disp: 100 each, Rfl: 3   omeprazole  (PRILOSEC) 20 MG capsule, TAKE 2 CAPSULES(40 MG) BY MOUTH DAILY, Disp: 180 capsule, Rfl: 3   OneTouch Delica Lancets 33G MISC, by Does not apply route., Disp: , Rfl:    oxymetazoline (AFRIN) 0.05 % nasal spray, Place 1 spray into both nostrils 2 (two) times daily., Disp: , Rfl:    OZEMPIC , 2 MG/DOSE, 8 MG/3ML SOPN, INJECT 2MG  INTO SKIN ONCE WEEKLY, Disp: 3 mL, Rfl: 2   polyethylene glycol (MIRALAX / GLYCOLAX) 17 g packet, Take 17 g by mouth daily., Disp: , Rfl:    ramipril  (ALTACE ) 10 MG capsule, TAKE 2 CAPSULES(20 MG) BY MOUTH DAILY, Disp: 180 capsule, Rfl: 3 [2]  Allergies Allergen Reactions   Lemon Flavoring Agent (Non-Screening) Anaphylaxis    my throat closes up   Amlodipine  Other (See Comments)    Ankle swelling   Benzocaine Other (See Comments)   Keflex  [Cephalexin ] Itching    Itching and hives no anaphylaxis.    Mango Flavor [Flavoring Agent] Hives   [name] Other (See Comments)   Neosporin [Bacitracin-Polymyxin B] Other (See Comments)    Swelling of eyelid   Sulfa Antibiotics Other (See Comments)    Other reaction(s): Unknown   Valsartan  Hives   "

## 2024-10-22 ENCOUNTER — Ambulatory Visit
Admission: RE | Admit: 2024-10-22 | Discharge: 2024-10-22 | Disposition: A | Discharge status: Home / Self Care | Source: Ambulatory Visit | Attending: Student in an Organized Health Care Education/Training Program | Admitting: Student in an Organized Health Care Education/Training Program

## 2024-10-22 DIAGNOSIS — J849 Interstitial pulmonary disease, unspecified: Secondary | ICD-10-CM | POA: Diagnosis present

## 2024-10-24 LAB — ALDOLASE: Aldolase: 4.5 U/L (ref 3.3–10.3)

## 2024-11-12 LAB — ANCA PROFILE
Anti-MPO Antibodies: <0.2 U (ref 0.0–0.9)
Anti-PR3 Antibodies: <0.2 U (ref 0.0–0.9)
Atypical pANCA: <1:20 {titer}
C-ANCA: <1:20 {titer}
P-ANCA: <1:20 {titer}

## 2024-11-12 LAB — ANA 12 PLUS PROFILE, POSITIVE
Anti-Cardiolipin Ab, IgA (RDL): <12 U/mL
Anti-Cardiolipin Ab, IgG (RDL): <15
Anti-Cardiolipin Ab, IgM (RDL): <13 [MPL'U]/mL
Anti-Centromere Ab (RDL): <1:40 {titer}
Anti-Chromatin Ab, IgG (RDL): <20 U
Anti-La (SS-B) Ab (RDL): <20 U
Anti-Ro (SS-A) Ab (RDL): <20 U
Anti-Scl-70 Ab (RDL): <20 U
Anti-Sm Ab (RDL): <20 U
Anti-TPO Ab (RDL): <9 [IU]/mL
Anti-dsDNA Ab by Farr(RDL): <8 [IU]/mL
C3 Complement (RDL): 214 mg/dL — AB (ref 90–180)
C4 Complement (RDL): 34 mg/dL (ref 10–40)
Speckled Pattern: 1:40 {titer} — ABNORMAL HIGH

## 2024-11-12 LAB — MYOMARKER 3 PLUS PROFILE (RDL)
Anti-EJ Ab (RDL): NEGATIVE
Anti-Jo-1 Ab (RDL): <20 U
Anti-Ku Ab (RDL): NEGATIVE
Anti-MDA-5 Ab (CADM-140)(RDL): <20 U
Anti-Mi-2 Ab (RDL): NEGATIVE
Anti-NXP-2 (P140) Ab (RDL): <20 U
Anti-OJ Ab (RDL): NEGATIVE
Anti-PL-12 Ab (RDL: NEGATIVE
Anti-PL-7 Ab (RDL): NEGATIVE
Anti-PM/Scl-100 Ab (RDL): <20 U
Anti-SAE1 Ab, IgG (RDL): <20 U
Anti-SRP Ab (RDL): NEGATIVE
Anti-SS-A 52kD Ab, IgG (RDL): <20 U
Anti-TIF-1gamma Ab (RDL): <20 U
Anti-U1 RNP Ab (RDL): <20 U
Anti-U2 RNP Ab (RDL): NEGATIVE
Anti-U3 RNP (Fibrillarin)(RDL): NEGATIVE

## 2024-11-12 LAB — HYPERSENSITIVITY PNEUMONITIS
A. Pullulans Abs: NEGATIVE
A.Fumigatus #1 Abs: NEGATIVE
Micropolyspora faeni, IgG: NEGATIVE
Pigeon Serum Abs: NEGATIVE
Thermoact. Saccharii: NEGATIVE
Thermoactinomyces vulgaris, IgG: NEGATIVE

## 2024-11-12 LAB — ANTI-CCP AB, IGG + IGA (RDL): Anti-CCP Ab, IgG + IgA (RDL): <20 U

## 2024-11-12 LAB — RHEUMATOID FACTOR: Rheumatoid fact SerPl-aCnc: <10 [IU]/mL

## 2024-11-12 LAB — CK: Total CK: 42 U/L (ref 32–182)

## 2024-11-12 LAB — ANA 12 PLUS PROFILE (RDL): Anti-Nuclear Ab by IFA (RDL): POSITIVE — AB

## 2024-12-09 ENCOUNTER — Ambulatory Visit: Admitting: Student in an Organized Health Care Education/Training Program

## 2024-12-09 ENCOUNTER — Encounter

## 2025-06-23 ENCOUNTER — Ambulatory Visit
